# Patient Record
Sex: Female | Born: 1955 | Race: Black or African American | Hispanic: No | Smoking: Current every day smoker
Health system: Southern US, Community
[De-identification: ages and names within clinical notes are randomized; demographics above are authoritative.]

## PROBLEM LIST (undated history)

## (undated) DIAGNOSIS — F32A Depression, unspecified: Secondary | ICD-10-CM

## (undated) DIAGNOSIS — R42 Dizziness and giddiness: Secondary | ICD-10-CM

## (undated) DIAGNOSIS — F419 Anxiety disorder, unspecified: Secondary | ICD-10-CM

## (undated) DIAGNOSIS — Z972 Presence of dental prosthetic device (complete) (partial): Secondary | ICD-10-CM

## (undated) DIAGNOSIS — G4730 Sleep apnea, unspecified: Secondary | ICD-10-CM

## (undated) DIAGNOSIS — F329 Major depressive disorder, single episode, unspecified: Secondary | ICD-10-CM

## (undated) HISTORY — DX: Anxiety disorder, unspecified: F41.9

## (undated) HISTORY — DX: Depression, unspecified: F32.A

## (undated) HISTORY — DX: Sleep apnea, unspecified: G47.30

## (undated) HISTORY — PX: ABDOMINAL HYSTERECTOMY: SHX81

## (undated) HISTORY — DX: Major depressive disorder, single episode, unspecified: F32.9

## (undated) HISTORY — PX: TUBAL LIGATION: SHX77

## (undated) HISTORY — PX: TOTAL ABDOMINAL HYSTERECTOMY: SHX209

---

## 2005-02-17 ENCOUNTER — Emergency Department: Payer: Self-pay | Admitting: Emergency Medicine

## 2005-03-17 ENCOUNTER — Other Ambulatory Visit: Payer: Self-pay

## 2005-03-25 ENCOUNTER — Ambulatory Visit: Payer: Self-pay | Admitting: Obstetrics & Gynecology

## 2009-06-14 ENCOUNTER — Encounter: Payer: Self-pay | Admitting: General Practice

## 2009-06-25 ENCOUNTER — Encounter: Payer: Self-pay | Admitting: General Practice

## 2010-08-26 ENCOUNTER — Emergency Department: Payer: Self-pay | Admitting: Emergency Medicine

## 2015-11-01 ENCOUNTER — Ambulatory Visit: Payer: Self-pay | Admitting: Family Medicine

## 2015-12-28 ENCOUNTER — Ambulatory Visit: Payer: Self-pay | Admitting: Family Medicine

## 2018-11-15 ENCOUNTER — Ambulatory Visit: Payer: Self-pay | Admitting: Nurse Practitioner

## 2018-11-16 ENCOUNTER — Ambulatory Visit: Payer: Managed Care, Other (non HMO) | Admitting: Nurse Practitioner

## 2018-11-16 ENCOUNTER — Encounter: Payer: Self-pay | Admitting: Nurse Practitioner

## 2018-11-16 ENCOUNTER — Other Ambulatory Visit: Payer: Self-pay

## 2018-11-16 VITALS — BP 110/76 | HR 76 | Temp 98.3°F | Ht 63.9 in | Wt 190.0 lb

## 2018-11-16 DIAGNOSIS — E6609 Other obesity due to excess calories: Secondary | ICD-10-CM | POA: Diagnosis not present

## 2018-11-16 DIAGNOSIS — Z13220 Encounter for screening for lipoid disorders: Secondary | ICD-10-CM

## 2018-11-16 DIAGNOSIS — Z6832 Body mass index (BMI) 32.0-32.9, adult: Secondary | ICD-10-CM

## 2018-11-16 DIAGNOSIS — Z1329 Encounter for screening for other suspected endocrine disorder: Secondary | ICD-10-CM

## 2018-11-16 DIAGNOSIS — F331 Major depressive disorder, recurrent, moderate: Secondary | ICD-10-CM | POA: Diagnosis not present

## 2018-11-16 DIAGNOSIS — Z9189 Other specified personal risk factors, not elsewhere classified: Secondary | ICD-10-CM | POA: Diagnosis not present

## 2018-11-16 DIAGNOSIS — E669 Obesity, unspecified: Secondary | ICD-10-CM | POA: Insufficient documentation

## 2018-11-16 DIAGNOSIS — E66811 Obesity, class 1: Secondary | ICD-10-CM

## 2018-11-16 DIAGNOSIS — F17210 Nicotine dependence, cigarettes, uncomplicated: Secondary | ICD-10-CM

## 2018-11-16 DIAGNOSIS — Z1239 Encounter for other screening for malignant neoplasm of breast: Secondary | ICD-10-CM

## 2018-11-16 NOTE — Assessment & Plan Note (Signed)
Chronic, ongoing.  Continue to collaborate with [facility] and recommended patient continue to obtain treatment there, along with scripts to maintain continuity of care.

## 2018-11-16 NOTE — Progress Notes (Addendum)
New Patient Office Visit  Subjective:  Patient ID: Heather Hill, female    DOB: 19-Mar-1956  Age: 63 y.o. MRN: [id number]  CC:  Chief Complaint  Patient presents with  . Establish Care    pt states that she wants to check if everything is fine with her health    HPI Heather Hill presents for new patient visit to establish care.  Introduced to Designer, jewellery role and practice setting.  All questions answered.  She attends psychiatry visits at [facility] in [place]. Has not had primary care follow-up or screening in several years.  DEPRESSION Currently on Abilify, Ativan, and Prozac which is managed by [facility].  She sees psychiatry next May.  Was diagnosed when she worked at [facility] [employer], many years ago.  Has sister with Bipolar.  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 Has a history of her son passing in 2003.  Mood status: stable Satisfied with current treatment?: endorses concern with weight gain with Abilify Symptom severity: moderate  Duration of current treatment : chronic Side effects: no Medication compliance: good compliance Psychotherapy/counseling: yes current Depressed mood: yes Anxious mood: yes Anhedonia: no Significant weight loss or gain: yes Insomnia: yes hard to fall asleep Fatigue: yes Feelings of worthlessness or guilt: no Impaired concentration/indecisiveness: no Suicidal ideations: no Hopelessness: no Crying spells: yes Depression screen [facility] 2/9 11/16/2018  Decreased Interest 2  Down, Depressed, Hopeless 2  PHQ - 2 Score 4  Altered sleeping 3  Tired, decreased energy 3  Change in appetite 3  Feeling bad or failure about yourself  3  Trouble concentrating 2  Moving slowly or fidgety/restless 0  Suicidal thoughts 0  PHQ-9 Score 18   NICOTINE DEPENDENCE: Currently smokes 1/2 PPD and has smoked since age 43.  She is using patch and lozenges, which were provided  by [facility] and is attempting to quit at this time.    Past Medical History:  Diagnosis Date  . Anxiety   . Depression     Past Surgical History:  Procedure Laterality Date  . ABDOMINAL HYSTERECTOMY    . TUBAL LIGATION      Family History  Problem Relation Age of Onset  . Diabetes Mother   . Alzheimer's disease Mother   . Hypertension Father   . Asthma Father   . Diabetes Father   . Bipolar disorder Sister   . Diabetes Sister   . Diabetes Brother   . Diabetes Maternal Grandfather   . Alzheimer's disease Maternal Grandfather   . Heart disease Maternal Grandfather   . Asthma Paternal Grandfather   . Diabetes Paternal Grandfather   . Hypertension Paternal Grandfather     Social History   Socioeconomic History  . Marital status: Married    Spouse name: Not on file  . Number of children: Not on file  . Years of education: Not on file  . Highest education level: Not on file  Occupational History  . Occupation: retired  Scientific laboratory technician  . Financial resource strain: Not very hard  . Food insecurity:    Worry: Never true    Inability: Never true  . Transportation needs:    Medical: No    Non-medical: No  Tobacco Use  . Smoking status: Current Every Day Smoker    Packs/day: 0.50    Years: 48.00    Pack years: 24.00  . Smokeless tobacco: Never Used  . Tobacco comment: she currently  uses a patch  Substance and Sexual Activity  . Alcohol use: Yes    Alcohol/week: 2.0 standard drinks    Types: 2 Glasses of wine per week  . Drug use: Never  . Sexual activity: Not Currently  Lifestyle  . Physical activity:    Days per week: 0 days    Minutes per session: 0 min  . Stress: Not on file  Relationships  . Social connections:    Talks on phone: Three times a week    Gets together: Three times a week    Attends religious service: Never    Active member of club or organization: No    Attends meetings of clubs or organizations: Never    Relationship status: Married  .  Intimate partner violence:    Fear of current or ex partner: No    Emotionally abused: No    Physically abused: No    Forced sexual activity: No  Other Topics Concern  . Not on file  Social History Narrative  . Not on file    ROS Review of Systems  Constitutional: Negative for activity change, appetite change, diaphoresis, fatigue and fever.  Respiratory: Negative for cough, chest tightness and shortness of breath.   Cardiovascular: Negative for chest pain, palpitations and leg swelling.  Gastrointestinal: Negative for abdominal distention, abdominal pain, constipation, diarrhea, nausea and vomiting.  Endocrine: Negative for cold intolerance, heat intolerance, polydipsia, polyphagia and polyuria.  Neurological: Negative for dizziness, syncope, weakness, light-headedness, numbness and headaches.  Psychiatric/Behavioral: Negative.     Objective:   Today's Vitals: BP 110/76   Pulse 76   Temp 98.3 F (36.8 C) (Oral)   Ht 5' 3.9" (1.623 m)   Wt 190 lb (86.2 kg)   SpO2 96%   BMI 32.72 kg/m   Physical Exam Vitals signs and nursing note reviewed.  Constitutional:      General: She is awake.     Appearance: She is well-developed. She is obese.  HENT:     Head: Normocephalic.  Eyes:     General:        Right eye: No discharge.        Left eye: No discharge.     Conjunctiva/sclera: Conjunctivae normal.     Pupils: Pupils are equal, round, and reactive to light.  Neck:     Musculoskeletal: Normal range of motion and neck supple.     Thyroid: No thyromegaly.     Vascular: No carotid bruit or JVD.  Cardiovascular:     Rate and Rhythm: Normal rate and regular rhythm.     Heart sounds: Normal heart sounds. No murmur. No gallop.   Pulmonary:     Effort: Pulmonary effort is normal.     Breath sounds: Normal breath sounds.  Abdominal:     General: Bowel sounds are normal.     Palpations: Abdomen is soft.  Musculoskeletal:     Right lower leg: No edema.     Left lower leg:  No edema.  Lymphadenopathy:     Cervical: No cervical adenopathy.  Skin:    General: Skin is warm and dry.  Neurological:     Mental Status: She is alert and oriented to person, place, and time.  Psychiatric:        Attention and Perception: Attention normal.        Mood and Affect: Mood normal.        Speech: Speech normal.        Behavior: Behavior normal. Behavior  is cooperative.        Thought Content: Thought content normal.        Judgment: Judgment normal.     Comments: Moments of tearfulness as she talked about her mother.     Assessment & Plan:   Problem List Items Addressed This Visit      Other   Moderate episode of recurrent major depressive disorder ([place]) - Primary    Chronic, ongoing.  Continue to collaborate with [facility] and recommended patient continue to obtain treatment there, along with scripts to maintain continuity of care.        Relevant Medications   FLUoxetine (PROZAC) 20 MG capsule   LORazepam (ATIVAN) 1 MG tablet   Other Relevant Orders   CBC with Differential/Platelet (Completed)   TSH (Completed)   VITAMIN D 25 Hydroxy (Vit-D Deficiency, Fractures) (Completed)   At high risk for caregiver role strain    Caregiver to her mother who has dementia, has assistance from [facility] and two of her sisters.  Monitor closely and continue ongoing caregiver discussions.  Recommend caregiver support group locally.      Obesity    Recommend continued focus on health diet choices and regular physical activity (30 minutes 5 days a week).  Will focus on losing 1-2 pounds a week over the next two months with focus on walking daily and diet changes.      Nicotine dependence, cigarettes, uncomplicated    I have recommended complete cessation of tobacco use. I have discussed various options available for assistance with tobacco cessation including over the counter methods (Nicotine gum, patch and lozenges). We also discussed prescription options (Chantix,  Nicotine Inhaler / Nasal Spray). The patient is not interested in pursuing any prescription tobacco cessation options at this time.       Other Visit Diagnoses    Screening cholesterol level       Relevant Orders   Comprehensive metabolic panel (Completed)   Lipid Panel w/o Chol/HDL Ratio (Completed)   Thyroid disorder screen       Relevant Orders   TSH (Completed)   Breast cancer screening       Relevant Orders   MM DIGITAL SCREENING BILATERAL      Outpatient Encounter Medications as of 11/16/2018  Medication Sig  . ARIPiprazole (ABILIFY) 5 MG tablet   . FLUoxetine (PROZAC) 20 MG capsule Take by mouth.  Marland Kitchen LORazepam (ATIVAN) 1 MG tablet Take by mouth.   No facility-administered encounter medications on file as of 11/16/2018.     Follow-up: Return in about 2 months (around 01/16/2019) for Physical Exam .   Venita Lick, NP

## 2018-11-16 NOTE — Patient Instructions (Signed)
Alzheimer Disease Caregiver Guide  Alzheimer disease causes a person to lose the ability to remember things and make decisions. A person who has Alzheimer disease may not be able to take care of himself or herself. He or she may need help with simple tasks. The tips below can help you care for the person. What kind of changes does this condition cause? This condition makes a person:  Forget things.  Feel confused.  Act differently.  Have different moods. These things get worse with time. Tips to help with symptoms  Be calm and patient.  Respond with a simple, short answer.  Avoid correcting the person in a negative way.  Try not to take things personally, even if the person forgets your name.  Do not argue with the person. This may make the person more upset. Tips to lessen frustration  Make appointments and do daily tasks when the person is at his or her best.  Take your time. Simple tasks may take longer. Allow plenty of time to complete tasks.  Limit choices for the person.  Involve the person in what you are doing.  Keep a daily routine.  Avoid new or crowded places, if possible.  Use simple words, short sentences, and a calm voice. Only give one direction at a time.  Buy clothes and shoes that are easy to put on and take off.  Organize medicines in a pillbox for each day of the week.  Keep a calendar in a central location to remind the person of meetings or other activities.  Let people help if they offer. Take a break when needed. Tips to prevent injury  Keep floors clear. Remove rugs, magazine racks, and floor lamps.  Keep hallways well-lit.  Put a handrail and non-slip mat in the bathtub or shower.  Put childproof locks on cabinets that have dangerous items in them. These items include medicine, alcohol, guns, toxic cleaning items, sharp tools, matches, and lighters.  Put locks on doors where the person cannot see or reach them. This helps the person  to not wander out of the house and get lost.  Be prepared for emergencies. Keep a list of emergency phone numbers and addresses close by.  Bracelets may be worn that track location and identify the person as having memory problems. This should be worn at all times for safety. Tips for the future  Discuss financial and legal planning early. People with this disease have trouble managing their money as the disease gets worse. Get help from a professional.  Talk about advance directives, safety, and daily care. Take these steps: ? Create a living will and choose a power of attorney. This is someone who can make decisions for the person with Alzheimer disease when he or she can no longer do so. ? Discuss driving safety and when to stop driving. The person's doctor can help with this. ? If the person lives alone, make sure he or she is safe. Some people need extra help at home. Other people need more care at a nursing home or care center. Where to find support You can find support by joining a support group near you. Some benefits of joining a support group include:  Learning ways to manage stress.  Sharing experiences with others.  Getting emotional comfort and support.  Learning about caregiving as the disease progresses.  Knowing what community resources are available and making use of them. Where to find more information  Alzheimer's Association: [employer] Contact a doctor if:  The person has a fever.  The person has a sudden behavior change that does not get better with calming strategies.  The person is not able to take care of himself or herself at home.  The person threatens you or anyone else, including himself or herself.  You are no longer able to care for the person. Summary  Alzheimer disease causes a person to forget things and to be confused.  A person who has this condition may not be able to take care of himself or herself.  Take steps to keep the person  from getting hurt. Plan for future care.  You can find support by joining a support group near you. This information is not intended to replace advice given to you by your health care provider. Make sure you discuss any questions you have with your health care provider. Document Released: 11/03/2011 Document Revised: 08/06/2017 Document Reviewed: 08/06/2017 Elsevier Interactive Patient Education  2019 [place] and Cholesterol Restricted Eating Plan Getting too much fat and cholesterol in your diet may cause health problems. Choosing the right foods helps keep your fat and cholesterol at normal levels. This can keep you from getting certain diseases. Your doctor may recommend an eating plan that includes:  Total fat: ______% or less of total calories a day.  Saturated fat: ______% or less of total calories a day.  Cholesterol: less than _________mg a day.  Fiber: ______g a day. What are tips for following this plan? Meal planning  At meals, divide your plate into four equal parts: ? Fill one-half of your plate with vegetables and green salads. ? Fill one-fourth of your plate with whole grains. ? Fill one-fourth of your plate with low-fat (lean) protein foods.  Eat fish that is high in omega-3 fats at least two times a week. This includes mackerel, tuna, sardines, and salmon.  Eat foods that are high in fiber, such as whole grains, beans, apples, broccoli, carrots, peas, and barley. General tips   Work with your doctor to lose weight if you need to.  Avoid: ? Foods with added sugar. ? Fried foods. ? Foods with partially hydrogenated oils.  Limit alcohol intake to no more than 1 drink a day for nonpregnant women and 2 drinks a day for men. One drink equals 12 oz of beer, 5 oz of wine, or 1 oz of hard liquor. Reading food labels  Check food labels for: ? Trans fats. ? Partially hydrogenated oils. ? Saturated fat (g) in each serving. ? Cholesterol (mg) in each  serving. ? Fiber (g) in each serving.  Choose foods with healthy fats, such as: ? Monounsaturated fats. ? Polyunsaturated fats. ? Omega-3 fats.  Choose grain products that have whole grains. Look for the word "whole" as the first word in the ingredient list. Cooking  Cook foods using low-fat methods. These include baking, boiling, grilling, and broiling.  Eat more home-cooked foods. Eat at restaurants and buffets less often.  Avoid cooking using saturated fats, such as butter, cream, palm oil, palm kernel oil, and coconut oil. Recommended foods  Fruits  All fresh, canned (in natural juice), or frozen fruits. Vegetables  Fresh or frozen vegetables (raw, steamed, roasted, or grilled). Green salads. Grains  Whole grains, such as whole wheat or whole grain breads, crackers, cereals, and pasta. Unsweetened oatmeal, bulgur, barley, quinoa, or brown rice. Corn or whole wheat flour tortillas. Meats and other protein foods  Ground beef (85% or leaner), grass-fed beef, or beef trimmed of fat.  Skinless chicken or [place]. Ground chicken or [place]. Pork trimmed of fat. All fish and seafood. Egg whites. Dried beans, peas, or lentils. Unsalted nuts or seeds. Unsalted canned beans. Nut butters without added sugar or oil. Dairy  Low-fat or nonfat dairy products, such as skim or 1% milk, 2% or reduced-fat cheeses, low-fat and fat-free ricotta or cottage cheese, or plain low-fat and nonfat yogurt. Fats and oils  Tub margarine without trans fats. Light or reduced-fat mayonnaise and salad dressings. Avocado. Olive, canola, sesame, or safflower oils. The items listed above may not be a complete list of foods and beverages you can eat. Contact a dietitian for more information. Foods to avoid Fruits  Canned fruit in heavy syrup. Fruit in cream or butter sauce. Fried fruit. Vegetables  Vegetables cooked in cheese, cream, or butter sauce. Fried vegetables. Grains  White bread. White pasta. White  rice. Cornbread. Bagels, pastries, and croissants. Crackers and snack foods that contain trans fat and hydrogenated oils. Meats and other protein foods  Fatty cuts of meat. Ribs, chicken wings, bacon, sausage, bologna, salami, chitterlings, fatback, hot dogs, bratwurst, and packaged lunch meats. Liver and organ meats. Whole eggs and egg yolks. Chicken and [place] with skin. Fried meat. Dairy  Whole or 2% milk, cream, half-and-half, and cream cheese. Whole milk cheeses. Whole-fat or sweetened yogurt. Full-fat cheeses. Nondairy creamers and whipped toppings. Processed cheese, cheese spreads, and cheese curds. Beverages  Alcohol. Sugar-sweetened drinks such as sodas, lemonade, and fruit drinks. Fats and oils  Butter, stick margarine, lard, shortening, ghee, or bacon fat. Coconut, palm kernel, and palm oils. Sweets and desserts  Corn syrup, sugars, honey, and molasses. Candy. Jam and jelly. Syrup. Sweetened cereals. Cookies, pies, cakes, donuts, muffins, and ice cream. The items listed above may not be a complete list of foods and beverages you should avoid. Contact a dietitian for more information. Summary  Choosing the right foods helps keep your fat and cholesterol at normal levels. This can keep you from getting certain diseases.  At meals, fill one-half of your plate with vegetables and green salads.  Eat high-fiber foods, like whole grains, beans, apples, carrots, peas, and barley.  Limit added sugar, saturated fats, alcohol, and fried foods. This information is not intended to replace advice given to you by your health care provider. Make sure you discuss any questions you have with your health care provider. Document Released: 02/10/2012 Document Revised: 04/14/2018 Document Reviewed: 04/28/2017 Elsevier Interactive Patient Education  2019 [employer].

## 2018-11-16 NOTE — Assessment & Plan Note (Addendum)
Caregiver to her mother who has dementia, has assistance from [facility] and two of her sisters.  Monitor closely and continue ongoing caregiver discussions.  Recommend caregiver support group locally.

## 2018-11-16 NOTE — Assessment & Plan Note (Signed)
Recommend continued focus on health diet choices and regular physical activity (30 minutes 5 days a week).  Will focus on losing 1-2 pounds a week over the next two months with focus on walking daily and diet changes.

## 2018-11-17 ENCOUNTER — Other Ambulatory Visit: Payer: Self-pay | Admitting: Nurse Practitioner

## 2018-11-17 DIAGNOSIS — F17210 Nicotine dependence, cigarettes, uncomplicated: Secondary | ICD-10-CM | POA: Insufficient documentation

## 2018-11-17 DIAGNOSIS — E7800 Pure hypercholesterolemia, unspecified: Secondary | ICD-10-CM

## 2018-11-17 DIAGNOSIS — E785 Hyperlipidemia, unspecified: Secondary | ICD-10-CM | POA: Insufficient documentation

## 2018-11-17 DIAGNOSIS — E559 Vitamin D deficiency, unspecified: Secondary | ICD-10-CM

## 2018-11-17 LAB — VITAMIN D 25 HYDROXY (VIT D DEFICIENCY, FRACTURES): Vit D, 25-Hydroxy: 10.3 ng/mL — ABNORMAL LOW (ref 30.0–100.0)

## 2018-11-17 LAB — LIPID PANEL W/O CHOL/HDL RATIO
Cholesterol, Total: 181 mg/dL (ref 100–199)
HDL: 47 mg/dL (ref >39)
LDL Calculated: 117 mg/dL — ABNORMAL HIGH (ref 0–99)
Triglycerides: 85 mg/dL (ref 0–149)
VLDL Cholesterol Cal: 17 mg/dL (ref 5–40)

## 2018-11-17 LAB — COMPREHENSIVE METABOLIC PANEL
A/G RATIO: 1.5 (ref 1.2–2.2)
ALT: 12 IU/L (ref 0–32)
AST: 13 IU/L (ref 0–40)
Albumin: 4.4 g/dL (ref 3.8–4.8)
Alkaline Phosphatase: 103 IU/L (ref 39–117)
BUN/Creatinine Ratio: 12 (ref 12–28)
BUN: 11 mg/dL (ref 8–27)
Bilirubin Total: 0.4 mg/dL (ref 0.0–1.2)
CALCIUM: 10 mg/dL (ref 8.7–10.3)
CO2: 25 mmol/L (ref 20–29)
Chloride: 101 mmol/L (ref 96–106)
Creatinine, Ser: 0.89 mg/dL (ref 0.57–1.00)
GFR calc Af Amer: 80 mL/min/{1.73_m2} (ref >59)
GFR, EST NON AFRICAN AMERICAN: 70 mL/min/{1.73_m2} (ref >59)
GLUCOSE: 96 mg/dL (ref 65–99)
Globulin, Total: 3 g/dL (ref 1.5–4.5)
Potassium: 4.7 mmol/L (ref 3.5–5.2)
Sodium: 141 mmol/L (ref 134–144)
TOTAL PROTEIN: 7.4 g/dL (ref 6.0–8.5)

## 2018-11-17 LAB — CBC WITH DIFFERENTIAL/PLATELET
Basophils Absolute: 0.1 10*3/uL (ref 0.0–0.2)
Basos: 1 %
EOS (ABSOLUTE): 0.1 10*3/uL (ref 0.0–0.4)
Eos: 2 %
Hematocrit: 43.2 % (ref 34.0–46.6)
Hemoglobin: 14.3 g/dL (ref 11.1–15.9)
Immature Grans (Abs): 0 10*3/uL (ref 0.0–0.1)
Immature Granulocytes: 0 %
Lymphocytes Absolute: 2 10*3/uL (ref 0.7–3.1)
Lymphs: 32 %
MCH: 26.7 pg (ref 26.6–33.0)
MCHC: 33.1 g/dL (ref 31.5–35.7)
MCV: 81 fL (ref 79–97)
MONOCYTES: 10 %
Monocytes Absolute: 0.6 10*3/uL (ref 0.1–0.9)
NEUTROS PCT: 55 %
Neutrophils Absolute: 3.5 10*3/uL (ref 1.4–7.0)
Platelets: 335 10*3/uL (ref 150–450)
RBC: 5.35 x10E6/uL — AB (ref 3.77–5.28)
RDW: 14.5 % (ref 11.7–15.4)
WBC: 6.3 10*3/uL (ref 3.4–10.8)

## 2018-11-17 LAB — TSH: TSH: 1.1 u[IU]/mL (ref 0.450–4.500)

## 2018-11-17 MED ORDER — CHOLECALCIFEROL 1.25 MG (50000 UT) PO CAPS: 50000.0000 [IU] | ORAL_CAPSULE | ORAL | 0 refills | Status: DC

## 2018-11-17 NOTE — Assessment & Plan Note (Signed)
I have recommended complete cessation of tobacco use. I have discussed various options available for assistance with tobacco cessation including over the counter methods (Nicotine gum, patch and lozenges). We also discussed prescription options (Chantix, Nicotine Inhaler / Nasal Spray). The patient is not interested in pursuing any prescription tobacco cessation options at this time.  

## 2018-11-17 NOTE — Progress Notes (Signed)
Vitamin D sent into pharmacy and need lab recheck in 8 weeks.

## 2018-11-18 ENCOUNTER — Telehealth: Payer: Self-pay | Admitting: Nurse Practitioner

## 2018-11-18 NOTE — Telephone Encounter (Signed)
Pt called and left voicemail in [place] to get lab results on 11/17/2018.  States she can be reached at [phone].  Copied from [place] [phone]. Topic: Quick Communication - Lab Results (Clinic Use ONLY) >> Nov 17, 2018  3:16 PM Amada Kingfisher, CMA wrote: Called patient to inform them of recent lab results. When patient returns call, triage nurse may disclose results.

## 2019-01-19 ENCOUNTER — Other Ambulatory Visit (HOSPITAL_COMMUNITY)
Admission: RE | Admit: 2019-01-19 | Discharge: 2019-01-19 | Disposition: A | Discharge status: Home / Self Care | Payer: Managed Care, Other (non HMO) | Source: Ambulatory Visit | Attending: Nurse Practitioner | Admitting: Nurse Practitioner

## 2019-01-19 ENCOUNTER — Encounter: Payer: Self-pay | Admitting: Nurse Practitioner

## 2019-01-19 ENCOUNTER — Other Ambulatory Visit: Payer: Self-pay

## 2019-01-19 ENCOUNTER — Ambulatory Visit (INDEPENDENT_AMBULATORY_CARE_PROVIDER_SITE_OTHER): Payer: Managed Care, Other (non HMO) | Admitting: Nurse Practitioner

## 2019-01-19 VITALS — BP 115/76 | HR 92 | Temp 98.4°F | Ht 64.57 in | Wt 193.0 lb

## 2019-01-19 DIAGNOSIS — F331 Major depressive disorder, recurrent, moderate: Secondary | ICD-10-CM | POA: Diagnosis not present

## 2019-01-19 DIAGNOSIS — Z23 Encounter for immunization: Secondary | ICD-10-CM

## 2019-01-19 DIAGNOSIS — Z1211 Encounter for screening for malignant neoplasm of colon: Secondary | ICD-10-CM

## 2019-01-19 DIAGNOSIS — Z6832 Body mass index (BMI) 32.0-32.9, adult: Secondary | ICD-10-CM

## 2019-01-19 DIAGNOSIS — Z0000 Encounter for general adult medical examination without abnormal findings: Secondary | ICD-10-CM | POA: Diagnosis not present

## 2019-01-19 DIAGNOSIS — Z114 Encounter for screening for human immunodeficiency virus [HIV]: Secondary | ICD-10-CM

## 2019-01-19 DIAGNOSIS — E7800 Pure hypercholesterolemia, unspecified: Secondary | ICD-10-CM | POA: Diagnosis not present

## 2019-01-19 DIAGNOSIS — Z1159 Encounter for screening for other viral diseases: Secondary | ICD-10-CM

## 2019-01-19 DIAGNOSIS — F17210 Nicotine dependence, cigarettes, uncomplicated: Secondary | ICD-10-CM | POA: Diagnosis not present

## 2019-01-19 DIAGNOSIS — E6609 Other obesity due to excess calories: Secondary | ICD-10-CM

## 2019-01-19 DIAGNOSIS — B3731 Acute candidiasis of vulva and vagina: Secondary | ICD-10-CM

## 2019-01-19 DIAGNOSIS — B373 Candidiasis of vulva and vagina: Secondary | ICD-10-CM

## 2019-01-19 LAB — WET PREP FOR TRICH, YEAST, CLUE
Clue Cell Exam: NEGATIVE
Trichomonas Exam: NEGATIVE
Yeast Exam: POSITIVE — AB

## 2019-01-19 MED ORDER — FLUCONAZOLE 150 MG PO TABS: 150.0000 mg | ORAL_TABLET | Freq: Once | ORAL | 0 refills | Status: AC

## 2019-01-19 NOTE — Assessment & Plan Note (Addendum)
Repeat lipid panel today, she reports she is fasting.  ASCVD 7.8% on last labs.  Add statin as needed and discuss with patient.  She has been focused on diet.

## 2019-01-19 NOTE — Assessment & Plan Note (Signed)
Recommend continued focus on health diet choices and regular physical activity (30 minutes 5 days a week). Focus on small losses at at time vs attempting to lose large amounts of weight at once.

## 2019-01-19 NOTE — Patient Instructions (Addendum)
[facility] at [facility]  Address: [address] [place], [place], [place] [address]  Phone: [phone]  May take Vitamin D3 1000 units daily,  Obtain at local [employer] or [employer]   Living With Post-Traumatic Stress Disorder If you have been diagnosed with post-traumatic stress disorder (PTSD), you may be relieved that you now know why you have felt or behaved a certain way. Still, you may feel overwhelmed about the treatment ahead. You may also wonder how to get the support you need and how to deal with the condition day-to-day. If you are living with PTSD, there are ways to help you recover from it and manage your symptoms. How to manage lifestyle changes Managing stress Stress is your body's reaction to life changes and events, both good and bad. Stress can make PTSD worse. Take the following steps to cope with stress:  Talk with your health care provider or a counselor if you would like to learn more about techniques to reduce your stress. He or she may suggest some stress reduction techniques such as: ? Muscle relaxation exercises. ? Regular exercise. ? Meditation, yoga, or other mind-body exercises. ? Breathing exercises. ? Listening to quiet music. ? Spending time outside.  Maintain a healthy lifestyle. Eat a healthy diet, exercise regularly, get plenty of sleep, and take time to relax.  Spend time with others. Talk with them about how you are feeling and what kind of support you need. Try to not isolate yourself, even though you may feel like doing that. Isolating yourself can delay your recovery.  Do activities and hobbies that you enjoy.  Pace yourself when doing stressful things. Take breaks, and reward yourself when you finish. Make sure that you do not overload your schedule.  Medicines Your health care provider may suggest certain medicines if he or she feels that they will help to improve your condition. Antidepressants or antipsychotic medicines may  be used to treat PTSD. Avoid using alcohol and other substances that may prevent your medicines from working properly (may interact). It is also important to:  Talk with your pharmacist or health care provider about all medicines that you take, their possible side effects, and which medicines are safe to take together.  Make it your goal to take part in all treatment decisions (shared decision-making). Ask about possible side effects of medicines that your health care provider recommends, and tell him or her how you feel about having those side effects. It is best if shared decision-making with your health care provider is part of your total treatment plan. If your health care provider prescribes a medicine, you may not notice the full benefits of it for 4-8 weeks. Most people who are treated for PTSD need to take medicine for at least 6-12 months after they feel better. If you are taking medicines as part of your treatment, do not stop taking medicines before you ask your health care provider if it is safe to stop. You may need to have the medicine slowly decreased (tapered) over time to lower the risk of harmful side effects. Relationships Many people who have PTSD have difficulty trusting others. Make an effort to:  Take risks and develop trust with close friends and family members. Developing trust in others can help you feel safe and connect you with emotional support.  Be open and honest about your feelings.  Try to have fun and relax in safe spaces, such as with friends and family.  Think about going to couples  counseling, family education classes, or family therapy. Your loved ones may not always know how to be supportive. Therapy can be helpful for everyone. How to recognize changes in your condition Be aware of your symptoms and how often you have them. The following symptoms mean that you need to seek help for your PTSD:  You feel suspicious and angry.  You have repeated flashbacks.   You avoid going out or being with others.  You have an increasing number of fights with close friends or family members, such as your spouse.  You have thoughts about hurting yourself or others.  You cannot get relief from feelings of depression or anxiety. Where to find support Talking to others  Explain that PTSD is a mental health problem. It is something that a person can develop after experiencing or seeing a life-threatening event. Tell them that PTSD makes you feel stress like you did during the event.  Talk to your loved ones about the symptoms you have. Also tell them what things or situations can cause symptoms to start (are triggers for you).  Assure your loved ones that there are treatments to help PTSD. Discuss possibly seeking family therapy or couples therapy.  If you are worried or fearful about seeking treatment, ask for support. Finances Not all insurance plans cover mental health care, so it is important to check with your insurance carrier. If paying for co-pays or counseling services is a problem, search for a local or county mental health care center. Public mental health care services may be offered there at a low cost or no cost when you are not able to see a private health care provider. If you are a veteran, contact a local veterans organization or veterans hospital for more information. If you are taking medicine for PTSD, you may be able to get the genericform, which may be less expensive than brand-name medicine. Some makers of prescription medicines also offer help to patients who cannot afford the medicines that they need. Community resources  Find a support group in your community. Often, groups are available for [employer] veterans, trauma victims, and family members or caregivers.  Look into volunteer opportunities. Taking part in these can help you feel more connected to your community.  Contact a local organization to find out if you are eligible for a  service dog.  Keep daily contact with at least one trusted friend or family member. Follow these instructions at home: Lifestyle  Exercise regularly. Try to do 30 or more minutes of physical activity on most days of the week.  Try to get 7-9 hours of sleep each night. To help with sleep: ? Keep your bedroom cool and dark. ? Do not eat a heavy meal during the hour before you go to bed. ? Do not drink alcohol or caffeinated drinks before bed. ? Avoid screen time before bedtime. This means avoiding use of your TV, computer, tablet, and cell phone.  Avoid using alcohol or drugs.  Practice self-soothing skills and use them daily.  Try to have fun and seek humor in your life. General instructions  If your PTSD is affecting your marriage or family, seek help from a family therapist.  Take over-the-counter and prescription medicines only as told by your health care provider.  Make sure to let all of your health care providers know that you have PTSD. This is especially important if you are having surgery or need to be admitted to the hospital.  Keep all follow-up visits as  told by your health care providers. This is important. Where to find more information Go to this website to find more information about PTSD, treatment for PTSD, and how to get support:  [facility] for PTSD: www.ptsd.PaintballBuzz.cz Contact a health care provider if:  Your symptoms get worse or they do not get better. Get help right away if:  You have thoughts about hurting yourself or others. If you ever feel like you may hurt yourself or others, or have thoughts about taking your own life, get help right away. You can go to your nearest emergency department or call:  Your local emergency services (911 in the [place]  A suicide crisis helpline, such as the [facility] at 2818231914. This is open 24-hours a day. Summary  If you are living with PTSD, there are ways to help you recover  from it and manage your symptoms.  Find supportive environments and people who understand PTSD. Spend time in those places, and maintain contact with those people.  Work with your health care team to create a management plan that includes counseling, stress management techniques, and healthy lifestyle habits. This information is not intended to replace advice given to you by your health care provider. Make sure you discuss any questions you have with your health care provider. Document Released: 12/11/2016 Document Revised: 12/11/2016 Document Reviewed: 12/11/2016 Elsevier Interactive Patient Education  2019 [employer]. Tdap Vaccine (Tetanus, Diphtheria and Pertussis): What You Need to Know 1. Why get vaccinated? Tetanus, diphtheria and pertussis are very serious diseases. Tdap vaccine can protect [place] from these diseases. And, Tdap vaccine given to pregnant women can protect newborn babies against pertussis.Marland Kitchen TETANUS (Lockjaw) is rare in the [place] today. It causes painful muscle tightening and stiffness, usually all over the body.  It can lead to tightening of muscles in the head and neck so you can't open your mouth, swallow, or sometimes even breathe. Tetanus kills about 1 out of 10 people who are infected even after receiving the best medical care. DIPHTHERIA is also rare in the [place] today. It can cause a thick coating to form in the back of the throat.  It can lead to breathing problems, heart failure, paralysis, and death. PERTUSSIS (Whooping Cough) causes severe coughing spells, which can cause difficulty breathing, vomiting and disturbed sleep.  It can also lead to weight loss, incontinence, and rib fractures. Up to 2 in 100 adolescents and 5 in 100 adults with pertussis are hospitalized or have complications, which could include pneumonia or death. These diseases are caused by bacteria. Diphtheria and pertussis are spread from person to person through secretions from  coughing or sneezing. Tetanus enters the body through cuts, scratches, or wounds. Before vaccines, as many as 200,000 cases of diphtheria, 200,000 cases of pertussis, and hundreds of cases of tetanus, were reported in the [place] each year. Since vaccination began, reports of cases for tetanus and diphtheria have dropped by about 99% and for pertussis by about 80%. 2. Tdap vaccine Tdap vaccine can protect adolescents and adults from tetanus, diphtheria, and pertussis. One dose of Tdap is routinely given at age 18 or 37. People who did not get Tdap at that age should get it as soon as possible. Tdap is especially important for healthcare professionals and anyone having close contact with a baby younger than 12 months. Pregnant women should get a dose of Tdap during every pregnancy, to protect the newborn from pertussis. Infants are most at risk for severe,  life-threatening complications from pertussis. Another vaccine, called Td, protects against tetanus and diphtheria, but not pertussis. A Td booster should be given every 10 years. Tdap may be given as one of these boosters if you have never gotten Tdap before. Tdap may also be given after a severe cut or burn to prevent tetanus infection. Your doctor or the person giving you the vaccine can give you more information. Tdap may safely be given at the same time as other vaccines. 3. Some people should not get this vaccine  A person who has ever had a life-threatening allergic reaction after a previous dose of any diphtheria, tetanus or pertussis containing vaccine, OR has a severe allergy to any part of this vaccine, should not get Tdap vaccine. Tell the person giving the vaccine about any severe allergies.  Anyone who had coma or long repeated seizures within 7 days after a childhood dose of DTP or DTaP, or a previous dose of Tdap, should not get Tdap, unless a cause other than the vaccine was found. They can still get Td.  Talk to your doctor if  you: ? have seizures or another nervous system problem, ? had severe pain or swelling after any vaccine containing diphtheria, tetanus or pertussis, ? ever had a condition called Guillain-Barr Syndrome (GBS), ? aren't feeling well on the day the shot is scheduled. 4. Risks With any medicine, including vaccines, there is a chance of side effects. These are usually mild and go away on their own. Serious reactions are also possible but are rare. Most people who get Tdap vaccine do not have any problems with it. Mild problems following Tdap (Did not interfere with activities)  Pain where the shot was given (about 3 in 4 adolescents or 2 in 3 adults)  Redness or swelling where the shot was given (about 1 person in 5)  Mild fever of at least 100.76F (up to about 1 in 25 adolescents or 1 in 100 adults)  Headache (about 3 or 4 people in 10)  Tiredness (about 1 person in 3 or 4)  Nausea, vomiting, diarrhea, stomach ache (up to 1 in 4 adolescents or 1 in 10 adults)  Chills, sore joints (about 1 person in 10)  Body aches (about 1 person in 3 or 4)  Rash, swollen glands (uncommon) Moderate problems following Tdap (Interfered with activities, but did not require medical attention)  Pain where the shot was given (up to 1 in 5 or 6)  Redness or swelling where the shot was given (up to about 1 in 16 adolescents or 1 in 12 adults)  Fever over 102F (about 1 in 100 adolescents or 1 in 250 adults)  Headache (about 1 in 7 adolescents or 1 in 10 adults)  Nausea, vomiting, diarrhea, stomach ache (up to 1 or 3 people in 100)  Swelling of the entire arm where the shot was given (up to about 1 in 500). Severe problems following Tdap (Unable to perform usual activities; required medical attention)  Swelling, severe pain, bleeding and redness in the arm where the shot was given (rare). Problems that could happen after any vaccine:  People sometimes faint after a medical procedure, including  vaccination. Sitting or lying down for about 15 minutes can help prevent fainting, and injuries caused by a fall. Tell your doctor if you feel dizzy, or have vision changes or ringing in the ears.  Some people get severe pain in the shoulder and have difficulty moving the arm where a shot was  given. This happens very rarely.  Any medication can cause a severe allergic reaction. Such reactions from a vaccine are very rare, estimated at fewer than 1 in a million doses, and would happen within a few minutes to a few hours after the vaccination. As with any medicine, there is a very remote chance of a vaccine causing a serious injury or death. The safety of vaccines is always being monitored. For more information, visit: http://www.aguilar.org/ 5. What if there is a serious problem? What should I look for?  Look for anything that concerns you, such as signs of a severe allergic reaction, very high fever, or unusual behavior. Signs of a severe allergic reaction can include hives, swelling of the face and throat, difficulty breathing, a fast heartbeat, dizziness, and weakness. These would usually start a few minutes to a few hours after the vaccination. What should I do?  If you think it is a severe allergic reaction or other emergency that can't wait, call 9-1-1 or get the person to the nearest hospital. Otherwise, call your doctor.  Afterward, the reaction should be reported to the Vaccine Adverse Event Reporting System (VAERS). Your doctor might file this report, or you can do it yourself through the VAERS web site at www.vaers.SamedayNews.es, or by calling 939-224-5487. VAERS does not give medical advice. 6. The National Vaccine Injury Compensation Program The [employer] Vaccine Injury Compensation Program (VICP) is a federal program that was created to compensate people who may have been injured by certain vaccines. Persons who believe they may have been injured by a vaccine can learn about the  program and about filing a claim by calling (225)057-2251 or visiting the [place] website at GoldCloset.com.ee. There is a time limit to file a claim for compensation. 7. How can I learn more?  Ask your doctor. He or she can give you the vaccine package insert or suggest other sources of information.  Call your local or state health department.  Contact the Centers for Disease Control and Prevention (CDC): ? Call 573-505-9103 (1-800-CDC-INFO) or ? Visit CDC's website at http://hunter.com/ Vaccine Information Statement Tdap Vaccine (10/18/2013) This information is not intended to replace advice given to you by your health care provider. Make sure you discuss any questions you have with your health care provider. Document Released: 02/10/2012 Document Revised: 03/29/2018 Document Reviewed: 03/29/2018 Elsevier Interactive Patient Education  2019 [employer].

## 2019-01-19 NOTE — Assessment & Plan Note (Signed)
I have recommended complete cessation of tobacco use. I have discussed various options available for assistance with tobacco cessation including over the counter methods (Nicotine gum, patch and lozenges). We also discussed prescription options (Chantix, Nicotine Inhaler / Nasal Spray). The patient is not interested in pursuing any prescription tobacco cessation options at this time.  

## 2019-01-19 NOTE — Progress Notes (Signed)
.   BP 115/76    Pulse 92    Temp 98.4 F (36.9 C) (Oral)    Ht 5' 4.57" (1.64 m)    Wt 193 lb (87.5 kg)    SpO2 98%    BMI 32.55 kg/m    Subjective:    Patient ID: Heather Hill, female    DOB: 01-27-1956, 63 y.o.   MRN: [id number]  HPI: Heather Hill is a 63 y.o. female presenting on 01/19/2019 for comprehensive medical examination. Current medical complaints include:none  She currently lives with: with her husband Menopausal Symptoms: no   DEPRESSION Currently on Abilify, Ativan, and Prozac which is managed by [facility].  Recently visited with them and she sees psychiatry next August 26th.  Has sister with Bipolar.  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 Has a history of her son passing in 2003.  She endorses driving as a trigger for her, especially if people drive too close or has to speed up.  Her son was killed in a car accident, discussed with her that this may be reason driving is trigger for her due to past traumatic experience with loss of son.  She does not currently do talk therapy and discussed this element may be beneficial for her due to her history, as it could provide ways to cope and techniques to help with anxieties while driving.  She is interested in therapy.  Therapy is available at [facility].   Mood status: stable Satisfied with current treatment?: no Symptom severity: mild  Duration of current treatment : chronic Side effects: no Medication compliance: good compliance Psychotherapy/counseling: not current Previous psychiatric medications: multiple she does not recall Depressed mood: yes Anxious mood: yes Anhedonia: no Significant weight loss or gain: no Insomnia: yes hard to fall asleep , reports psychiatrist is going to put her on another medication for this Fatigue: no Feelings of worthlessness or guilt: no Impaired concentration/indecisiveness: no Suicidal ideations:  no Hopelessness: yes Crying spells: no  Depression Screen done today and results listed below:  Depression screen [facility] 2/9 01/19/2019 11/16/2018  Decreased Interest 3 2  Down, Depressed, Hopeless 3 2  PHQ - 2 Score 6 4  Altered sleeping 3 3  Tired, decreased energy 3 3  Change in appetite 3 3  Feeling bad or failure about yourself  2 3  Trouble concentrating 2 2  Moving slowly or fidgety/restless 0 0  Suicidal thoughts 0 0  PHQ-9 Score 19 18  Difficult doing work/chores Somewhat difficult -    The patient does not have a history of falls. I did not complete a risk assessment for falls. A plan of care for falls was not documented.   Past Medical History:  Past Medical History:  Diagnosis Date   Anxiety    Depression     Surgical History:  Past Surgical History:  Procedure Laterality Date   ABDOMINAL HYSTERECTOMY     TUBAL LIGATION      Medications:  Current Outpatient Medications on File Prior to Visit  Medication Sig   ARIPiprazole (ABILIFY) 5 MG tablet    Cholecalciferol 1.25 MG (50000 UT) capsule Take 1 capsule (50,000 Units total) by mouth once a week.   FLUoxetine (PROZAC) 20 MG capsule Take by mouth.   LORazepam (ATIVAN) 1 MG tablet Take by mouth.   No current facility-administered medications on file prior to visit.     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 hard   Food insecurity:    Worry: Never true    Inability: Never true   Transportation needs:    Medical: No    Non-medical: No  Tobacco Use   Smoking status: Current Every Day Smoker    Packs/day: 0.50    Years: 48.00    Pack years: 24.00   Smokeless tobacco: Never  Used   Tobacco comment: she currently uses a patch  Substance and Sexual Activity   Alcohol use: Yes    Alcohol/week: 2.0 standard drinks    Types: 2 Glasses of wine per week   Drug use: Never   Sexual activity: Not Currently  Lifestyle   Physical activity:    Days per week: 0 days    Minutes per session: 0 min   Stress: Not on file  Relationships   Social connections:    Talks on phone: Three times a week    Gets together: Three times a week    Attends religious service: Never    Active member of club or organization: No    Attends meetings of clubs or organizations: Never    Relationship status: Married   Intimate partner violence:    Fear of current or ex partner: No    Emotionally abused: No    Physically abused: No    Forced sexual activity: No  Other Topics Concern   Not on file  Social History Narrative   Not on file   Social History   Tobacco Use  Smoking Status Current Every Day Smoker   Packs/day: 0.50   Years: 48.00   Pack years: 24.00  Smokeless Tobacco Never Used  Tobacco Comment   she currently uses a patch   Social History   Substance and Sexual Activity  Alcohol Use Yes   Alcohol/week: 2.0 standard drinks   Types: 2 Glasses of wine per week    Family History:  Family History  Problem Relation Age of Onset   Diabetes Mother    Alzheimer's disease Mother    Hypertension Father    Asthma Father    Diabetes Father    Bipolar disorder Sister    Diabetes Sister    Diabetes Brother    Diabetes Maternal Grandfather    Alzheimer's disease Maternal Grandfather    Heart disease Maternal Grandfather    Asthma Paternal Grandfather    Diabetes Paternal Grandfather    Hypertension Paternal Grandfather     Past medical history, surgical history, medications, allergies, family history and social history reviewed with patient today and changes made to appropriate areas of the chart.   Review of Systems - Negative All  other ROS negative except what is listed above and in the HPI.      Objective:    BP 115/76    Pulse 92    Temp 98.4 F (36.9 C) (Oral)    Ht 5' 4.57" (1.64 m)    Wt 193 lb (87.5 kg)    SpO2 98%    BMI 32.55 kg/m   Wt Readings from Last 3 Encounters:  01/19/19 193 lb (87.5 kg)  11/16/18 190 lb (86.2 kg)  Physical Exam Vitals signs and nursing note reviewed.  Constitutional:      General: She is awake. She is not in acute distress.    Appearance: She is well-developed and overweight. She is not ill-appearing.  HENT:     Head: Normocephalic.     Right Ear: Hearing, tympanic membrane, ear canal and external ear normal.     Left Ear: Hearing, tympanic membrane, ear canal and external ear normal.     Nose: Nose normal.     Mouth/Throat:     Mouth: Mucous membranes are moist.  Eyes:     General: Lids are normal.        Right eye: No discharge.        Left eye: No discharge.     Extraocular Movements: Extraocular movements intact.     Conjunctiva/sclera: Conjunctivae normal.     Pupils: Pupils are equal, round, and reactive to light.     Visual Fields: Right eye visual fields normal and left eye visual fields normal.  Neck:     Musculoskeletal: Normal range of motion and neck supple.     Thyroid: No thyromegaly.     Vascular: No carotid bruit.  Cardiovascular:     Rate and Rhythm: Normal rate and regular rhythm.     Heart sounds: Normal heart sounds. No murmur. No gallop.   Pulmonary:     Effort: Pulmonary effort is normal.     Breath sounds: Normal breath sounds.  Chest:     Breasts:        Right: Normal. No swelling, bleeding, inverted nipple, mass, nipple discharge, skin change or tenderness.        Left: Normal. No swelling, bleeding, inverted nipple, mass, nipple discharge, skin change or tenderness.  Abdominal:     General: Bowel sounds are normal.     Palpations: Abdomen is soft. There is no hepatomegaly or splenomegaly.     Hernia: There is no hernia in the right  inguinal area or left inguinal area.  Genitourinary:    Labia:        Right: No rash, tenderness or lesion.        Left: No rash, tenderness or lesion.      Vagina: Normal.     Cervix: Normal.     Uterus: Normal.      Adnexa: Right adnexa normal and left adnexa normal.     Comments: Cervix visualized at 12 o'clock aspect.  Mild thick, white discharge noted. Wet prep obtained prior to pap.   Musculoskeletal:     Right lower leg: No edema.     Left lower leg: No edema.  Lymphadenopathy:     Cervical: No cervical adenopathy.     Upper Body:     Right upper body: No supraclavicular, axillary or pectoral adenopathy.     Left upper body: No supraclavicular, axillary or pectoral adenopathy.  Skin:    General: Skin is warm and dry.  Neurological:     Mental Status: She is alert and oriented to person, place, and time.  Psychiatric:        Attention and Perception: Attention normal.        Mood and Affect: Mood normal.        Behavior: Behavior normal. Behavior is cooperative.        Thought Content: Thought content normal.        Judgment: Judgment normal.     Results for orders placed or performed in visit on 01/19/19  WET PREP FOR TRICH, YEAST, CLUE  Result Value Ref Range   Trichomonas Exam Negative Negative   Yeast Exam Positive (A) Negative   Clue Cell Exam Negative Negative      Assessment & Plan:   Problem List Items Addressed This Visit      Genitourinary   Yeast vaginitis    Wet prep positive for yeast.  Diflucan script sent and discussed with patient.        Relevant Medications   fluconazole (DIFLUCAN) 150 MG tablet   Other Relevant Orders   WET PREP FOR TRICH, YEAST, CLUE (Completed)     Other   Moderate episode of recurrent major depressive disorder (HCC)    Chronic, ongoing, suspect some PTSD due to past traumatic experience with loss of son.  Continue current medication regimen and collaboration with psychiatry.  Referral to [place] psych to obtain  therapy, which would benefit patient due to past history and current triggers.  She is aware all psychiatry medications are to remain to be obtained from her psychiatrist.        Relevant Orders   Ambulatory referral to Psychology   Obesity    Recommend continued focus on health diet choices and regular physical activity (30 minutes 5 days a week). Focus on small losses at at time vs attempting to lose large amounts of weight at once.      Nicotine dependence, cigarettes, uncomplicated    I have recommended complete cessation of tobacco use. I have discussed various options available for assistance with tobacco cessation including over the counter methods (Nicotine gum, patch and lozenges). We also discussed prescription options (Chantix, Nicotine Inhaler / Nasal Spray). The patient is not interested in pursuing any prescription tobacco cessation options at this time.      Elevated LDL cholesterol level    Repeat lipid panel today, she reports she is fasting.  ASCVD 7.8% on last labs.  Add statin as needed and discuss with patient.  She has been focused on diet.      Relevant Orders   Lipid Panel w/o Chol/HDL Ratio    Other Visit Diagnoses    Encounter for annual physical exam    -  Primary   Relevant Orders   Ambulatory referral to Psychology   Cytology - PAP   Need for hepatitis C screening test       Hep C ordered on labs   Relevant Orders   Hepatitis C Antibody   Tdap vaccine greater than or equal to 7yo IM (Completed)   MM DIGITAL SCREENING BILATERAL   Encounter for screening for HIV       HIV ordered on labs   Relevant Orders   HIV antibody (with reflex)   Colon cancer screening       GI referral placed   Relevant Orders   Ambulatory referral to Gastroenterology       Follow up plan: Return in about 6 months (around 07/22/2019) for Mood and cholesterol check.   LABORATORY TESTING:  - Pap smear: pap done  IMMUNIZATIONS:   - Tdap: Tetanus vaccination status  reviewed: Td vaccination indicated and given today. - Influenza: Up to date - Pneumovax: Not applicable - Prevnar: Not applicable - HPV: Not applicable - Zostavax vaccine: Refused  SCREENING: -Mammogram: Ordered today  - Colonoscopy: Ordered today  - Bone Density: Not applicable  -Hearing Test: Not applicable  -Spirometry: Not applicable   PATIENT COUNSELING:   Advised to take 1 mg of folate supplement per  day if capable of pregnancy.   Sexuality: Discussed sexually transmitted diseases, partner selection, use of condoms, avoidance of unintended pregnancy  and contraceptive alternatives.   Advised to avoid cigarette smoking.  I discussed with the patient that most people either abstain from alcohol or drink within safe limits (<=14/week and <=4 drinks/occasion for males, <=7/weeks and <= 3 drinks/occasion for females) and that the risk for alcohol disorders and other health effects rises proportionally with the number of drinks per week and how often a drinker exceeds daily limits.  Discussed cessation/primary prevention of drug use and availability of treatment for abuse.   Diet: Encouraged to adjust caloric intake to maintain  or achieve ideal body weight, to reduce intake of dietary saturated fat and total fat, to limit sodium intake by avoiding high sodium foods and not adding table salt, and to maintain adequate dietary potassium and calcium preferably from fresh fruits, vegetables, and low-fat dairy products.    stressed the importance of regular exercise  Injury prevention: Discussed safety belts, safety helmets, smoke detector, smoking near bedding or upholstery.   Dental health: Discussed importance of regular tooth brushing, flossing, and dental visits.    NEXT PREVENTATIVE PHYSICAL DUE IN 1 YEAR. Return in about 6 months (around 07/22/2019) for Mood and cholesterol check.

## 2019-01-19 NOTE — Assessment & Plan Note (Signed)
Chronic, ongoing, suspect some PTSD due to past traumatic experience with loss of son.  Continue current medication regimen and collaboration with psychiatry.  Referral to [place] psych to obtain therapy, which would benefit patient due to past history and current triggers.  She is aware all psychiatry medications are to remain to be obtained from her psychiatrist.

## 2019-01-19 NOTE — Assessment & Plan Note (Signed)
Wet prep positive for yeast.  Diflucan script sent and discussed with patient.

## 2019-01-20 ENCOUNTER — Telehealth: Payer: Self-pay

## 2019-01-20 ENCOUNTER — Other Ambulatory Visit: Payer: Self-pay

## 2019-01-20 DIAGNOSIS — Z1211 Encounter for screening for malignant neoplasm of colon: Secondary | ICD-10-CM

## 2019-01-20 LAB — LIPID PANEL W/O CHOL/HDL RATIO
Cholesterol, Total: 169 mg/dL (ref 100–199)
HDL: 49 mg/dL (ref >39)
LDL Calculated: 101 mg/dL — ABNORMAL HIGH (ref 0–99)
Triglycerides: 93 mg/dL (ref 0–149)
VLDL Cholesterol Cal: 19 mg/dL (ref 5–40)

## 2019-01-20 LAB — HIV ANTIBODY (ROUTINE TESTING W REFLEX): HIV Screen 4th Generation wRfx: NONREACTIVE

## 2019-01-20 LAB — HEPATITIS C ANTIBODY: Hep C Virus Ab: <0.1 {s_co_ratio} (ref 0.0–0.9)

## 2019-01-20 MED ORDER — NA SULFATE-K SULFATE-MG SULF 17.5-3.13-1.6 GM/177ML PO SOLN: 1.0000 | Freq: Once | ORAL | 0 refills | Status: AC

## 2019-01-20 NOTE — Telephone Encounter (Signed)
Gastroenterology Pre-Procedure Review  Request Date: 02/02/19 Requesting Physician: Dr. Marius Ditch  PATIENT REVIEW QUESTIONS: The patient responded to the following health history questions as indicated:    1. Are you having any GI issues? no 2. Do you have a personal history of Polyps? no 3. Do you have a family history of Colon Cancer or Polyps? no 4. Diabetes Mellitus? no 5. Joint replacements in the past 12 months?no 6. Major health problems in the past 3 months?no 7. Any artificial heart valves, MVP, or defibrillator?no    MEDICATIONS & ALLERGIES:    Patient reports the following regarding taking any anticoagulation/antiplatelet therapy:   Plavix, Coumadin, Eliquis, Xarelto, Lovenox, Pradaxa, Brilinta, or Effient? no Aspirin? no  Patient confirms/reports the following medications:  Current Outpatient Medications  Medication Sig Dispense Refill  . ARIPiprazole (ABILIFY) 5 MG tablet     . Cholecalciferol 1.25 MG (50000 UT) capsule Take 1 capsule (50,000 Units total) by mouth once a week. 8 capsule 0  . FLUoxetine (PROZAC) 20 MG capsule Take by mouth.    Marland Kitchen LORazepam (ATIVAN) 1 MG tablet Take by mouth.    . Na Sulfate-K Sulfate-Mg Sulf 17.5-3.13-1.6 GM/177ML SOLN Take 1 kit by mouth once for 1 dose. 354 mL 0   No current facility-administered medications for this visit.     Patient confirms/reports the following allergies:  No Known Allergies  No orders of the defined types were placed in this encounter.   AUTHORIZATION INFORMATION Primary Insurance: 1D#: Group #:  Secondary Insurance: 1D#: Group #:  SCHEDULE INFORMATION: Date: MSCTime: Location:02/02/19

## 2019-01-21 LAB — CYTOLOGY - PAP
Adequacy: ABSENT
Diagnosis: NEGATIVE
HPV: NOT DETECTED

## 2019-01-21 MED ORDER — ATORVASTATIN CALCIUM 10 MG PO TABS: 10.0000 mg | ORAL_TABLET | Freq: Every day | ORAL | 3 refills | Status: DC

## 2019-01-21 NOTE — Addendum Note (Signed)
Addended by: Marnee Guarneri T on: 01/21/2019 05:03 PM   Modules accepted: Orders

## 2019-01-27 ENCOUNTER — Encounter: Payer: Self-pay | Admitting: *Deleted

## 2019-01-27 ENCOUNTER — Other Ambulatory Visit: Payer: Self-pay

## 2019-01-27 ENCOUNTER — Other Ambulatory Visit: Admission: RE | Admit: 2019-01-27 | Payer: Managed Care, Other (non HMO) | Source: Ambulatory Visit

## 2019-01-27 ENCOUNTER — Inpatient Hospital Stay: Admission: RE | Admit: 2019-01-27 | Payer: Managed Care, Other (non HMO) | Source: Ambulatory Visit

## 2019-01-28 ENCOUNTER — Other Ambulatory Visit
Admission: RE | Admit: 2019-01-28 | Discharge: 2019-01-28 | Disposition: A | Discharge status: Home / Self Care | Payer: Managed Care, Other (non HMO) | Source: Ambulatory Visit | Attending: Gastroenterology | Admitting: Gastroenterology

## 2019-01-28 ENCOUNTER — Other Ambulatory Visit: Payer: Managed Care, Other (non HMO)

## 2019-01-28 ENCOUNTER — Other Ambulatory Visit: Admission: RE | Admit: 2019-01-28 | Payer: Managed Care, Other (non HMO) | Source: Ambulatory Visit

## 2019-01-28 DIAGNOSIS — Z01812 Encounter for preprocedural laboratory examination: Secondary | ICD-10-CM | POA: Diagnosis not present

## 2019-01-28 DIAGNOSIS — Z1159 Encounter for screening for other viral diseases: Secondary | ICD-10-CM | POA: Insufficient documentation

## 2019-01-29 LAB — NOVEL CORONAVIRUS, NAA (HOSP ORDER, SEND-OUT TO REF LAB; TAT 18-24 HRS): SARS-CoV-2, NAA: NOT DETECTED

## 2019-02-01 NOTE — Discharge Instructions (Signed)
General Anesthesia, Adult, Care After  This sheet gives you information about how to care for yourself after your procedure. Your health care provider may also give you more specific instructions. If you have problems or questions, contact your health care provider.  What can I expect after the procedure?  After the procedure, the following side effects are common:  Pain or discomfort at the IV site.  Nausea.  Vomiting.  Sore throat.  Trouble concentrating.  Feeling cold or chills.  Weak or tired.  Sleepiness and fatigue.  Soreness and body aches. These side effects can affect parts of the body that were not involved in surgery.  Follow these instructions at home:    For at least 24 hours after the procedure:  Have a responsible adult stay with you. It is important to have someone help care for you until you are awake and alert.  Rest as needed.  Do not:  Participate in activities in which you could fall or become injured.  Drive.  Use heavy machinery.  Drink alcohol.  Take sleeping pills or medicines that cause drowsiness.  Make important decisions or sign legal documents.  Take care of children on your own.  Eating and drinking  Follow any instructions from your health care provider about eating or drinking restrictions.  When you feel hungry, start by eating small amounts of foods that are soft and easy to digest (bland), such as toast. Gradually return to your regular diet.  Drink enough fluid to keep your urine pale yellow.  If you vomit, rehydrate by drinking water, juice, or clear broth.  General instructions  If you have sleep apnea, surgery and certain medicines can increase your risk for breathing problems. Follow instructions from your health care provider about wearing your sleep device:  Anytime you are sleeping, including during daytime naps.  While taking prescription pain medicines, sleeping medicines, or medicines that make you drowsy.  Return to your normal activities as told by your health care  provider. Ask your health care provider what activities are safe for you.  Take over-the-counter and prescription medicines only as told by your health care provider.  If you smoke, do not smoke without supervision.  Keep all follow-up visits as told by your health care provider. This is important.  Contact a health care provider if:  You have nausea or vomiting that does not get better with medicine.  You cannot eat or drink without vomiting.  You have pain that does not get better with medicine.  You are unable to pass urine.  You develop a skin rash.  You have a fever.  You have redness around your IV site that gets worse.  Get help right away if:  You have difficulty breathing.  You have chest pain.  You have blood in your urine or stool, or you vomit blood.  Summary  After the procedure, it is common to have a sore throat or nausea. It is also common to feel tired.  Have a responsible adult stay with you for the first 24 hours after general anesthesia. It is important to have someone help care for you until you are awake and alert.  When you feel hungry, start by eating small amounts of foods that are soft and easy to digest (bland), such as toast. Gradually return to your regular diet.  Drink enough fluid to keep your urine pale yellow.  Return to your normal activities as told by your health care provider. Ask your health care   provider what activities are safe for you.  This information is not intended to replace advice given to you by your health care provider. Make sure you discuss any questions you have with your health care provider.  Document Released: 11/17/2000 Document Revised: 03/27/2017 Document Reviewed: 03/27/2017  Elsevier Interactive Patient Education  2019 [employer]

## 2019-02-02 ENCOUNTER — Other Ambulatory Visit: Payer: Self-pay

## 2019-02-02 ENCOUNTER — Ambulatory Visit: Payer: Managed Care, Other (non HMO) | Admitting: Anesthesiology

## 2019-02-02 ENCOUNTER — Ambulatory Visit
Admission: RE | Admit: 2019-02-02 | Discharge: 2019-02-02 | Disposition: A | Discharge status: Home / Self Care | Payer: Managed Care, Other (non HMO) | Attending: Gastroenterology | Admitting: Gastroenterology

## 2019-02-02 ENCOUNTER — Encounter
Admission: RE | Disposition: A | Discharge status: Home / Self Care | Payer: Self-pay | Source: Home / Self Care | Attending: Gastroenterology

## 2019-02-02 DIAGNOSIS — K5730 Diverticulosis of large intestine without perforation or abscess without bleeding: Secondary | ICD-10-CM | POA: Diagnosis not present

## 2019-02-02 DIAGNOSIS — Z1211 Encounter for screening for malignant neoplasm of colon: Secondary | ICD-10-CM | POA: Insufficient documentation

## 2019-02-02 DIAGNOSIS — K635 Polyp of colon: Secondary | ICD-10-CM | POA: Diagnosis not present

## 2019-02-02 DIAGNOSIS — F419 Anxiety disorder, unspecified: Secondary | ICD-10-CM | POA: Diagnosis not present

## 2019-02-02 DIAGNOSIS — Z79899 Other long term (current) drug therapy: Secondary | ICD-10-CM | POA: Diagnosis not present

## 2019-02-02 DIAGNOSIS — F17210 Nicotine dependence, cigarettes, uncomplicated: Secondary | ICD-10-CM | POA: Insufficient documentation

## 2019-02-02 DIAGNOSIS — F329 Major depressive disorder, single episode, unspecified: Secondary | ICD-10-CM | POA: Diagnosis not present

## 2019-02-02 DIAGNOSIS — D123 Benign neoplasm of transverse colon: Secondary | ICD-10-CM | POA: Diagnosis not present

## 2019-02-02 HISTORY — DX: Dizziness and giddiness: R42

## 2019-02-02 HISTORY — PX: COLONOSCOPY WITH PROPOFOL: SHX5780

## 2019-02-02 HISTORY — PX: POLYPECTOMY: SHX5525

## 2019-02-02 HISTORY — DX: Presence of dental prosthetic device (complete) (partial): Z97.2

## 2019-02-02 SURGERY — COLONOSCOPY WITH PROPOFOL
Anesthesia: General

## 2019-02-02 MED ORDER — PROPOFOL 10 MG/ML IV BOLUS
INTRAVENOUS | Status: DC | PRN
  Administered 2019-02-02 (×9): 20 mg via INTRAVENOUS
  Administered 2019-02-02: 30 mg via INTRAVENOUS
  Administered 2019-02-02: 70 mg via INTRAVENOUS

## 2019-02-02 MED ORDER — STERILE WATER FOR IRRIGATION IR SOLN
Status: DC | PRN
  Administered 2019-02-02: 5 mL

## 2019-02-02 MED ORDER — LACTATED RINGERS IV SOLN
10.0000 mL/h | INTRAVENOUS | Status: DC
  Administered 2019-02-02: 08:00:00 via INTRAVENOUS

## 2019-02-02 MED ORDER — LACTATED RINGERS IV SOLN
INTRAVENOUS | Status: DC
  Administered 2019-02-02: 08:00:00 via INTRAVENOUS

## 2019-02-02 MED ORDER — LIDOCAINE HCL (CARDIAC) PF 100 MG/5ML IV SOSY
PREFILLED_SYRINGE | INTRAVENOUS | Status: DC | PRN
  Administered 2019-02-02: 40 mg via INTRAVENOUS

## 2019-02-02 SURGICAL SUPPLY — 25 items
CANISTER SUCT 1200ML W/VALVE (MISCELLANEOUS) ×4 IMPLANT
CLIP HMST 235XBRD CATH ROT (MISCELLANEOUS) IMPLANT
CLIP RESOLUTION 360 11X235 (MISCELLANEOUS)
ELECT REM PT RETURN 9FT ADLT (ELECTROSURGICAL)
ELECTRODE REM PT RTRN 9FT ADLT (ELECTROSURGICAL) IMPLANT
FCP ESCP3.2XJMB 240X2.8X (MISCELLANEOUS)
FORCEPS BIOP RAD 4 LRG CAP 4 (CUTTING FORCEPS) IMPLANT
FORCEPS BIOP RJ4 240 W/NDL (MISCELLANEOUS)
FORCEPS ESCP3.2XJMB 240X2.8X (MISCELLANEOUS) IMPLANT
GOWN CVR UNV OPN BCK APRN NK (MISCELLANEOUS) ×4 IMPLANT
GOWN ISOL THUMB LOOP REG UNIV (MISCELLANEOUS) ×4
INJECTOR VARIJECT VIN23 (MISCELLANEOUS) IMPLANT
KIT DEFENDO VALVE AND CONN (KITS) IMPLANT
KIT ENDO PROCEDURE OLY (KITS) ×4 IMPLANT
MARKER SPOT ENDO TATTOO 5ML (MISCELLANEOUS) IMPLANT
PROBE APC STR FIRE (PROBE) IMPLANT
RETRIEVER NET ROTH 2.5X230 LF (MISCELLANEOUS) IMPLANT
SNARE COLD EXACTO (MISCELLANEOUS) ×4 IMPLANT
SNARE SHORT THROW 13M SML OVAL (MISCELLANEOUS) IMPLANT
SNARE SHORT THROW 30M LRG OVAL (MISCELLANEOUS) IMPLANT
SNARE SNG USE RND 15MM (INSTRUMENTS) IMPLANT
SPOT EX ENDOSCOPIC TATTOO (MISCELLANEOUS)
TRAP ETRAP POLY (MISCELLANEOUS) ×4 IMPLANT
VARIJECT INJECTOR VIN23 (MISCELLANEOUS)
WATER STERILE IRR 250ML POUR (IV SOLUTION) ×4 IMPLANT

## 2019-02-02 NOTE — Transfer of Care (Signed)
Immediate Anesthesia Transfer of Care Note  Patient: Heather Hill  Procedure(s) Performed: COLONOSCOPY WITH PROPOFOL (N/A ) POLYPECTOMY  Patient Location: PACU  Anesthesia Type: General  Level of Consciousness: awake, alert  and patient cooperative  Airway and Oxygen Therapy: Patient Spontanous Breathing and Patient connected to supplemental oxygen  Post-op Assessment: Post-op Vital signs reviewed, Patient's Cardiovascular Status Stable, Respiratory Function Stable, Patent Airway and No signs of Nausea or vomiting  Post-op Vital Signs: Reviewed and stable  Complications: No apparent anesthesia complications

## 2019-02-02 NOTE — Op Note (Signed)
[facility] Gastroenterology Patient Name: Heather Hill Procedure Date: 02/02/2019 7:37 AM MRN: [id number] Account #: [id number] Date of Birth: May 27, 1956 Admit Type: Outpatient Age: 63 Room: [facility] OR ROOM 01 Gender: Female Note Status: Finalized Procedure:            Colonoscopy Indications:          Screening for colorectal malignant neoplasm, This is                        the patient's first colonoscopy Providers:            Lin Landsman MD, MD Referring MD:         Guadalupe Maple, MD (Referring MD) Medicines:            Monitored Anesthesia Care Complications:        No immediate complications. Estimated blood loss: None. Procedure:            Pre-Anesthesia Assessment:                       - Prior to the procedure, a History and Physical was                        performed, and patient medications and allergies were                        reviewed. The patient is competent. The risks and                        benefits of the procedure and the sedation options and                        risks were discussed with the patient. All questions                        were answered and informed consent was obtained.                        Patient identification and proposed procedure were                        verified by the physician, the nurse, the                        anesthesiologist, the anesthetist and the technician in                        the pre-procedure area in the procedure room in the                        endoscopy suite. Mental Status Examination: alert and                        oriented. Airway Examination: normal oropharyngeal                        airway and neck mobility. Respiratory Examination:                        clear to auscultation. CV Examination: normal.  Prophylactic Antibiotics: The patient does not require                        prophylactic antibiotics. Prior Anticoagulants: The               patient has taken no previous anticoagulant or                        antiplatelet agents. ASA Grade Assessment: II - A                        patient with mild systemic disease. After reviewing the                        risks and benefits, the patient was deemed in                        satisfactory condition to undergo the procedure. The                        anesthesia plan was to use monitored anesthesia care                        (MAC). Immediately prior to administration of                        medications, the patient was re-assessed for adequacy                        to receive sedatives. The heart rate, respiratory rate,                        oxygen saturations, blood pressure, adequacy of                        pulmonary ventilation, and response to care were                        monitored throughout the procedure. The physical status                        of the patient was re-assessed after the procedure.                       After obtaining informed consent, the colonoscope was                        passed under direct vision. Throughout the procedure,                        the patient's blood pressure, pulse, and oxygen                        saturations were monitored continuously. The was                        introduced through the anus and advanced to the the                        cecum, identified by appendiceal orifice and ileocecal  valve. The colonoscopy was performed with moderate                        difficulty due to multiple diverticula in the colon.                        Successful completion of the procedure was aided by                        applying abdominal pressure. The patient tolerated the                        procedure well. The quality of the bowel preparation                        was evaluated using the BBPS [facility] Bowel Preparation                        Scale) with scores of: Right Colon = 3,  Transverse                        Colon = 3 and Left Colon = 3 (entire mucosa seen well                        with no residual staining, small fragments of stool or                        opaque liquid). The total BBPS score equals 9. Findings:      The perianal and digital rectal examinations were normal. Pertinent       negatives include normal sphincter tone and no palpable rectal lesions.      Five sessile polyps were found in the sigmoid colon, descending colon       and transverse colon. The polyps were 5 mm in size. These polyps were       removed with a cold snare. Resection and retrieval were complete.      The retroflexed view of the distal rectum and anal verge was normal and       showed no anal or rectal abnormalities.      Multiple diverticula were found in the sigmoid colon. There was no       evidence of diverticular bleeding. Impression:           - Five 5 mm polyps in the sigmoid colon, in the                        descending colon and in the transverse colon, removed                        with a cold snare. Resected and retrieved.                       - The distal rectum and anal verge are normal on                        retroflexion view.                       - Severe diverticulosis in the sigmoid colon. There  was                        no evidence of diverticular bleeding. Recommendation:       - Discharge patient to home (with escort).                       - Resume previous diet today.                       - Continue present medications.                       - Await pathology results.                       - Repeat colonoscopy in 5-10 years for surveillance                        based on pathology results. Procedure Code(s):    --- Professional ---                       [phone], Colonoscopy, flexible; with removal of tumor(s),                        polyp(s), or other lesion(s) by snare technique Diagnosis Code(s):    --- Professional ---                        Z12.11, Encounter for screening for malignant neoplasm                        of colon                       K63.5, Polyp of colon                       K57.30, Diverticulosis of large intestine without                        perforation or abscess without bleeding CPT copyright 2019 American Medical Association. All rights reserved. The codes documented in this report are preliminary and upon coder review may  be revised to meet current compliance requirements. Dr. Ulyess Mort Lin Landsman MD, MD 02/02/2019 8:45:35 AM This report has been signed electronically. Number of Addenda: 0 Note Initiated On: 02/02/2019 7:37 AM Scope Withdrawal Time: 0 hours 14 minutes 16 seconds  Total Procedure Duration: 0 hours 19 minutes 47 seconds  Estimated Blood Loss: Estimated blood loss: none.      [facility]

## 2019-02-02 NOTE — Anesthesia Preprocedure Evaluation (Signed)
Anesthesia Evaluation  Patient identified by MRN, date of birth, ID band Patient awake    Reviewed: Allergy & Precautions, H&P , NPO status , Patient's Chart, lab work & pertinent test results  Airway Mallampati: II  TM Distance: >3 FB Neck ROM: full    Dental  (+) Dental Advidsory Given, Missing, Poor Dentition, Loose   Pulmonary Current Smoker,    Pulmonary exam normal breath sounds clear to auscultation       Cardiovascular Normal cardiovascular exam Rhythm:regular Rate:Normal     Neuro/Psych    GI/Hepatic   Endo/Other    Renal/GU      Musculoskeletal   Abdominal   Peds  Hematology   Anesthesia Other Findings   Reproductive/Obstetrics                             Anesthesia Physical Anesthesia Plan  ASA: II  Anesthesia Plan: General   Post-op Pain Management:    Induction: Intravenous  PONV Risk Score and Plan: 3 and 2 and Propofol infusion and Treatment may vary due to age or medical condition  Airway Management Planned: Natural Airway  Additional Equipment:   Intra-op Plan:   Post-operative Plan:   Informed Consent: I have reviewed the patients History and Physical, chart, labs and discussed the procedure including the risks, benefits and alternatives for the proposed anesthesia with the patient or authorized representative who has indicated his/her understanding and acceptance.       Plan Discussed with: CRNA  Anesthesia Plan Comments:         Anesthesia Quick Evaluation

## 2019-02-02 NOTE — H&P (Signed)
Heather Darby, MD [address]  [place]  [place], [place] [address]  Main: [phone]  Fax: [phone] Pager: [phone]  Primary Care Physician:  Venita Lick, NP Primary Gastroenterologist:  Dr. Cephas Hill  Pre-Procedure History & Physical: HPI:  Heather Hill is a 63 y.o. female is here for an colonoscopy.   Past Medical History:  Diagnosis Date  . Anxiety   . Depression   . Vertigo    none for over 8 yrs  . Wears dentures    partial upper    Past Surgical History:  Procedure Laterality Date  . ABDOMINAL HYSTERECTOMY    . TUBAL LIGATION      Prior to Admission medications   Medication Sig Start Date End Date Taking? Authorizing Provider  ARIPiprazole (ABILIFY) 5 MG tablet  10/26/18  Yes [provider]  atorvastatin (LIPITOR) 10 MG tablet Take 1 tablet (10 mg total) by mouth daily. 01/21/19  Yes Cannady, Jolene T, NP  Cholecalciferol 1.25 MG (50000 UT) capsule Take 1 capsule (50,000 Units total) by mouth once a week. 11/17/18  Yes Cannady, Jolene T, NP  FLUoxetine (PROZAC) 20 MG capsule Take by mouth.   Yes [provider]  LORazepam (ATIVAN) 1 MG tablet Take by mouth.   Yes [provider]    Allergies as of 01/20/2019  . (No Known Allergies)    Family History  Problem Relation Age of Onset  . Diabetes Mother   . Alzheimer's disease Mother   . Hypertension Father   . Asthma Father   . Diabetes Father   . Bipolar disorder Sister   . Diabetes Sister   . Diabetes Brother   . Diabetes Maternal Grandfather   . Alzheimer's disease Maternal Grandfather   . Heart disease Maternal Grandfather   . Asthma Paternal Grandfather   . Diabetes Paternal Grandfather   . Hypertension Paternal Grandfather     Social History   Socioeconomic History  . Marital status: Married    Spouse name: Not on file  . Number of children: Not on file  . Years of education: Not on file  . Highest education level: Not on file   Occupational History  . Occupation: retired  Scientific laboratory technician  . Financial resource strain: Not very hard  . Food insecurity:    Worry: Never true    Inability: Never true  . Transportation needs:    Medical: No    Non-medical: No  Tobacco Use  . Smoking status: Current Every Day Smoker    Packs/day: 0.50    Years: 48.00    Pack years: 24.00  . Smokeless tobacco: Never Used  . Tobacco comment: she currently uses a patch  Substance and Sexual Activity  . Alcohol use: Yes    Alcohol/week: 2.0 standard drinks    Types: 2 Glasses of wine per week  . Drug use: Never  . Sexual activity: Not Currently  Lifestyle  . Physical activity:    Days per week: 0 days    Minutes per session: 0 min  . Stress: Not on file  Relationships  . Social connections:    Talks on phone: Three times a week    Gets together: Three times a week    Attends religious service: Never    Active member of club or organization: No    Attends meetings of clubs or organizations: Never    Relationship status: Married  . Intimate partner violence:    Fear of current  or ex partner: No    Emotionally abused: No    Physically abused: No    Forced sexual activity: No  Other Topics Concern  . Not on file  Social History Narrative  . Not on file    Review of Systems: See HPI, otherwise negative ROS  Physical Exam: BP 98/69   Pulse 78   Temp (!) 97.3 F (36.3 C)   Resp 17   Ht 5\' 4"  (1.626 m)   Wt 87.5 kg   SpO2 99%   BMI 33.13 kg/m  General:   Alert,  pleasant and cooperative in NAD Head:  Normocephalic and atraumatic. Neck:  Supple; no masses or thyromegaly. Lungs:  Clear throughout to auscultation.    Heart:  Regular rate and rhythm. Abdomen:  Soft, nontender and nondistended. Normal bowel sounds, without guarding, and without rebound.   Neurologic:  Alert and  oriented x4;  grossly normal neurologically.  Impression/Plan: Heather Hill is here for an colonoscopy to be performed for colon  cancer screening  Risks, benefits, limitations, and alternatives regarding  colonoscopy have been reviewed with the patient.  Questions have been answered.  All parties agreeable.   Sherri Sear, MD  02/02/2019, 8:10 AM

## 2019-02-02 NOTE — Anesthesia Procedure Notes (Signed)
Performed by: Jayveon Convey, CRNA Pre-anesthesia Checklist: Patient identified, Emergency Drugs available, Suction available, Timeout performed and Patient being monitored Patient Re-evaluated:Patient Re-evaluated prior to induction Oxygen Delivery Method: Nasal cannula Placement Confirmation: positive ETCO2       

## 2019-02-02 NOTE — Anesthesia Postprocedure Evaluation (Signed)
Anesthesia Post Note  Patient: Heather Hill  Procedure(s) Performed: COLONOSCOPY WITH PROPOFOL (N/A ) POLYPECTOMY  Patient location during evaluation: PACU Anesthesia Type: General Level of consciousness: awake and alert and oriented Pain management: satisfactory to patient Vital Signs Assessment: post-procedure vital signs reviewed and stable Respiratory status: spontaneous breathing, nonlabored ventilation and respiratory function stable Cardiovascular status: blood pressure returned to baseline and stable Postop Assessment: Adequate PO intake and No signs of nausea or vomiting Anesthetic complications: no    Raliegh Ip

## 2019-02-03 ENCOUNTER — Encounter: Payer: Self-pay | Admitting: Gastroenterology

## 2019-02-04 ENCOUNTER — Encounter: Payer: Self-pay | Admitting: Gastroenterology

## 2019-02-23 ENCOUNTER — Ambulatory Visit: Payer: Managed Care, Other (non HMO) | Admitting: Nurse Practitioner

## 2019-02-28 ENCOUNTER — Encounter: Payer: Self-pay | Admitting: Nurse Practitioner

## 2019-02-28 ENCOUNTER — Other Ambulatory Visit: Payer: Self-pay

## 2019-02-28 ENCOUNTER — Ambulatory Visit: Payer: Managed Care, Other (non HMO) | Admitting: Nurse Practitioner

## 2019-02-28 VITALS — BP 107/75 | HR 95 | Temp 98.3°F | Wt 195.4 lb

## 2019-02-28 DIAGNOSIS — E782 Mixed hyperlipidemia: Secondary | ICD-10-CM | POA: Diagnosis not present

## 2019-02-28 LAB — LIPID PANEL PICCOLO, WAIVED
Chol/HDL Ratio Piccolo,Waive: 2.9 mg/dL
Cholesterol Piccolo, Waived: 155 mg/dL (ref <200)
HDL Chol Piccolo, Waived: 54 mg/dL — ABNORMAL LOW (ref >59)
LDL Chol Calc Piccolo Waived: 86 mg/dL (ref <100)
Triglycerides Piccolo,Waived: 80 mg/dL (ref <150)
VLDL Chol Calc Piccolo,Waive: 16 mg/dL (ref <30)

## 2019-02-28 MED ORDER — ATORVASTATIN CALCIUM 10 MG PO TABS: 10.0000 mg | ORAL_TABLET | Freq: Every day | ORAL | 2 refills | Status: DC

## 2019-02-28 NOTE — Assessment & Plan Note (Signed)
Chronic, LDL 117 and 101 with slight elevation in risk score (ASCVD) and smoker.  LDL 86 today and TCHOL 155.  Will continue statin. Does not want CCM referral to discuss medication assistance at this time.  Return in 6 months.

## 2019-02-28 NOTE — Patient Instructions (Signed)
Fat and Cholesterol Restricted Eating Plan Getting too much fat and cholesterol in your diet may cause health problems. Choosing the right foods helps keep your fat and cholesterol at normal levels. This can keep you from getting certain diseases. Your doctor may recommend an eating plan that includes:  Total fat: ______% or less of total calories a day.  Saturated fat: ______% or less of total calories a day.  Cholesterol: less than _________mg a day.  Fiber: ______g a day. What are tips for following this plan? Meal planning  At meals, divide your plate into four equal parts: ? Fill one-half of your plate with vegetables and green salads. ? Fill one-fourth of your plate with whole grains. ? Fill one-fourth of your plate with low-fat (lean) protein foods.  Eat fish that is high in omega-3 fats at least two times a week. This includes mackerel, tuna, sardines, and salmon.  Eat foods that are high in fiber, such as whole grains, beans, apples, broccoli, carrots, peas, and barley. General tips   Work with your doctor to lose weight if you need to.  Avoid: ? Foods with added sugar. ? Fried foods. ? Foods with partially hydrogenated oils.  Limit alcohol intake to no more than 1 drink a day for nonpregnant women and 2 drinks a day for men. One drink equals 12 oz of beer, 5 oz of wine, or 1 oz of hard liquor. Reading food labels  Check food labels for: ? Trans fats. ? Partially hydrogenated oils. ? Saturated fat (g) in each serving. ? Cholesterol (mg) in each serving. ? Fiber (g) in each serving.  Choose foods with healthy fats, such as: ? Monounsaturated fats. ? Polyunsaturated fats. ? Omega-3 fats.  Choose grain products that have whole grains. Look for the word "whole" as the first word in the ingredient list. Cooking  Cook foods using low-fat methods. These include baking, boiling, grilling, and broiling.  Eat more home-cooked foods. Eat at restaurants and buffets  less often.  Avoid cooking using saturated fats, such as butter, cream, palm oil, palm kernel oil, and coconut oil. Recommended foods  Fruits  All fresh, canned (in natural juice), or frozen fruits. Vegetables  Fresh or frozen vegetables (raw, steamed, roasted, or grilled). Green salads. Grains  Whole grains, such as whole wheat or whole grain breads, crackers, cereals, and pasta. Unsweetened oatmeal, bulgur, barley, quinoa, or brown rice. Corn or whole wheat flour tortillas. Meats and other protein foods  Ground beef (85% or leaner), grass-fed beef, or beef trimmed of fat. Skinless chicken or turkey. Ground chicken or turkey. Pork trimmed of fat. All fish and seafood. Egg whites. Dried beans, peas, or lentils. Unsalted nuts or seeds. Unsalted canned beans. Nut butters without added sugar or oil. Dairy  Low-fat or nonfat dairy products, such as skim or 1% milk, 2% or reduced-fat cheeses, low-fat and fat-free ricotta or cottage cheese, or plain low-fat and nonfat yogurt. Fats and oils  Tub margarine without trans fats. Light or reduced-fat mayonnaise and salad dressings. Avocado. Olive, canola, sesame, or safflower oils. The items listed above may not be a complete list of foods and beverages you can eat. Contact a dietitian for more information. Foods to avoid Fruits  Canned fruit in heavy syrup. Fruit in cream or butter sauce. Fried fruit. Vegetables  Vegetables cooked in cheese, cream, or butter sauce. Fried vegetables. Grains  White bread. White pasta. White rice. Cornbread. Bagels, pastries, and croissants. Crackers and snack foods that contain trans fat   and hydrogenated oils. Meats and other protein foods  Fatty cuts of meat. Ribs, chicken wings, bacon, sausage, bologna, salami, chitterlings, fatback, hot dogs, bratwurst, and packaged lunch meats. Liver and organ meats. Whole eggs and egg yolks. Chicken and turkey with skin. Fried meat. Dairy  Whole or 2% milk, cream,  half-and-half, and cream cheese. Whole milk cheeses. Whole-fat or sweetened yogurt. Full-fat cheeses. Nondairy creamers and whipped toppings. Processed cheese, cheese spreads, and cheese curds. Beverages  Alcohol. Sugar-sweetened drinks such as sodas, lemonade, and fruit drinks. Fats and oils  Butter, stick margarine, lard, shortening, ghee, or bacon fat. Coconut, palm kernel, and palm oils. Sweets and desserts  Corn syrup, sugars, honey, and molasses. Candy. Jam and jelly. Syrup. Sweetened cereals. Cookies, pies, cakes, donuts, muffins, and ice cream. The items listed above may not be a complete list of foods and beverages you should avoid. Contact a dietitian for more information. Summary  Choosing the right foods helps keep your fat and cholesterol at normal levels. This can keep you from getting certain diseases.  At meals, fill one-half of your plate with vegetables and green salads.  Eat high-fiber foods, like whole grains, beans, apples, carrots, peas, and barley.  Limit added sugar, saturated fats, alcohol, and fried foods. This information is not intended to replace advice given to you by your health care provider. Make sure you discuss any questions you have with your health care provider. Document Released: 02/10/2012 Document Revised: 04/14/2018 Document Reviewed: 04/28/2017 Elsevier Patient Education  2020 Elsevier Inc.  

## 2019-02-28 NOTE — Progress Notes (Signed)
BP 107/75   Pulse 95   Temp 98.3 F (36.8 C) (Oral)   Wt 195 lb 6.4 oz (88.6 kg)   SpO2 99%   BMI 33.54 kg/m    Subjective:    Patient ID: Heather Hill, female    DOB: 12-11-1955, 63 y.o.   MRN: [id number]  HPI: Heather Hill is a 63 y.o. female  Chief Complaint  Patient presents with  . Hyperlipidemia    Out of lipid medication x 2 weeks due to finical issues.    HYPERLIPIDEMIA Was started on Atorvastatin 10 MG daily at last visit for elevation in LDL and cardiac risk.  Has been out of medication for two weeks and can not financially afford to pick up until end of this week per report.  She denies ADR with new medication. Hyperlipidemia status: good compliance Satisfied with current treatment?  yes Side effects:  no Medication compliance: good compliance Past cholesterol meds: atorvastain (lipitor) Supplements: none Aspirin:  no The 10-year ASCVD risk score Mikey Bussing DC Jr., et al., 2013) is: 6.8%   Values used to calculate the score:     Age: 42 years     Sex: Female     Is Non-Hispanic African American: Yes     Diabetic: No     Tobacco smoker: Yes     Systolic Blood Pressure: 846 mmHg     Is BP treated: No     HDL Cholesterol: 49 mg/dL     Total Cholesterol: 169 mg/dL Chest pain:  no Coronary artery disease:  no Family history CAD:  yes Family history early CAD:  no  Relevant past medical, surgical, family and social history reviewed and updated as indicated. Interim medical history since our last visit reviewed. Allergies and medications reviewed and updated.  Review of Systems  Constitutional: Negative for activity change, appetite change, diaphoresis, fatigue and fever.  Respiratory: Negative for cough, chest tightness and shortness of breath.   Cardiovascular: Negative for chest pain, palpitations and leg swelling.  Gastrointestinal: Negative for abdominal distention, abdominal pain, constipation, diarrhea, nausea and vomiting.  Musculoskeletal:  Negative.   Neurological: Negative for dizziness, syncope, weakness, light-headedness, numbness and headaches.  Psychiatric/Behavioral: Negative.     Per HPI unless specifically indicated above     Objective:    BP 107/75   Pulse 95   Temp 98.3 F (36.8 C) (Oral)   Wt 195 lb 6.4 oz (88.6 kg)   SpO2 99%   BMI 33.54 kg/m   Wt Readings from Last 3 Encounters:  02/28/19 195 lb 6.4 oz (88.6 kg)  02/02/19 193 lb (87.5 kg)  01/19/19 193 lb (87.5 kg)    Physical Exam Vitals signs and nursing note reviewed.  Constitutional:      General: She is awake. She is not in acute distress.    Appearance: She is well-developed. She is not ill-appearing.  HENT:     Head: Normocephalic.     Right Ear: Hearing normal.     Left Ear: Hearing normal.     Nose: Nose normal.     Mouth/Throat:     Mouth: Mucous membranes are moist.  Eyes:     General: Lids are normal.        Right eye: No discharge.        Left eye: No discharge.     Conjunctiva/sclera: Conjunctivae normal.     Pupils: Pupils are equal, round, and reactive to light.  Neck:     Musculoskeletal: Normal  range of motion and neck supple.     Thyroid: No thyromegaly.     Vascular: No carotid bruit or JVD.  Cardiovascular:     Rate and Rhythm: Normal rate and regular rhythm.     Heart sounds: Normal heart sounds. No murmur. No gallop.   Pulmonary:     Effort: Pulmonary effort is normal.     Breath sounds: Normal breath sounds.  Abdominal:     General: Bowel sounds are normal.     Palpations: Abdomen is soft. There is no hepatomegaly or splenomegaly.  Musculoskeletal:     Right lower leg: No edema.     Left lower leg: No edema.  Lymphadenopathy:     Cervical: No cervical adenopathy.  Skin:    General: Skin is warm and dry.  Neurological:     Mental Status: She is alert and oriented to person, place, and time.  Psychiatric:        Attention and Perception: Attention normal.        Mood and Affect: Mood normal.         Speech: Speech normal.        Behavior: Behavior normal. Behavior is cooperative.        Thought Content: Thought content normal.        Judgment: Judgment normal.     Results for orders placed or performed during the hospital encounter of 01/28/19  Novel Coronavirus, NAA (hospital order; send-out to ref lab)   Specimen: Nasopharyngeal Swab; Respiratory  Result Value Ref Range   SARS-CoV-2, NAA NOT DETECTED NOT DETECTED   Coronavirus Source NASOPHARYNGEAL       Assessment & Plan:   Problem List Items Addressed This Visit      Other   Hyperlipidemia - Primary    Chronic, LDL 117 and 101 with slight elevation in risk score (ASCVD) and smoker.  LDL 86 today and TCHOL 155.  Will continue statin. Does not want CCM referral to discuss medication assistance at this time.  Return in 6 months.      Relevant Medications   atorvastatin (LIPITOR) 10 MG tablet   Other Relevant Orders   Lipid Panel Piccolo, Waived   Comprehensive metabolic panel       Follow up plan: Return in about 6 months (around 08/31/2019) for HLD and mood.

## 2019-03-01 LAB — COMPREHENSIVE METABOLIC PANEL
ALT: 17 IU/L (ref 0–32)
AST: 14 IU/L (ref 0–40)
Albumin/Globulin Ratio: 1.4 (ref 1.2–2.2)
Albumin: 4.4 g/dL (ref 3.8–4.8)
Alkaline Phosphatase: 106 IU/L (ref 39–117)
BUN/Creatinine Ratio: 13 (ref 12–28)
BUN: 11 mg/dL (ref 8–27)
Bilirubin Total: 0.2 mg/dL (ref 0.0–1.2)
CO2: 23 mmol/L (ref 20–29)
Calcium: 9.5 mg/dL (ref 8.7–10.3)
Chloride: 103 mmol/L (ref 96–106)
Creatinine, Ser: 0.83 mg/dL (ref 0.57–1.00)
GFR calc Af Amer: 87 mL/min/{1.73_m2} (ref >59)
GFR calc non Af Amer: 76 mL/min/{1.73_m2} (ref >59)
Globulin, Total: 3.1 g/dL (ref 1.5–4.5)
Glucose: 104 mg/dL — ABNORMAL HIGH (ref 65–99)
Potassium: 4 mmol/L (ref 3.5–5.2)
Sodium: 141 mmol/L (ref 134–144)
Total Protein: 7.5 g/dL (ref 6.0–8.5)

## 2019-03-01 NOTE — Progress Notes (Signed)
Normal test results noted.  Please call patient and make them aware of normal results and will continue to monitor at regular visits.  Have a great day.  Look forward to seeing you at your next visit.

## 2019-05-16 ENCOUNTER — Other Ambulatory Visit: Payer: Self-pay

## 2019-05-16 MED ORDER — CHOLECALCIFEROL 1.25 MG (50000 UT) PO CAPS: 50000.0000 [IU] | ORAL_CAPSULE | ORAL | 0 refills | Status: DC

## 2019-05-25 ENCOUNTER — Other Ambulatory Visit: Payer: Self-pay

## 2019-05-25 DIAGNOSIS — Z20822 Contact with and (suspected) exposure to covid-19: Secondary | ICD-10-CM

## 2019-05-26 LAB — NOVEL CORONAVIRUS, NAA: SARS-CoV-2, NAA: NOT DETECTED

## 2019-06-08 ENCOUNTER — Ambulatory Visit: Payer: Self-pay | Admitting: *Deleted

## 2019-06-08 NOTE — Telephone Encounter (Addendum)
   Reason for Disposition . [1] MODERATE pain (e.g., interferes with normal activities, limping) AND [2] present > 3 days  Answer Assessment - Initial Assessment Questions 1. LOCATION: "Which joint is swollen?"     Right worse than left - ankle and foot  2. ONSET: "When did the swelling start?"     2 weeks ago 3. SIZE: "How large is the swelling?"      4. PAIN: "Is there any pain?" If so, ask: "How bad is it?" (Scale 1-10; or mild, moderate, severe)     Moderate swelling 5. CAUSE: "What do you think caused the swollen joint?"     Unsure  6. OTHER SYMPTOMS: "Do you have any other symptoms?" (e.g., fever, chest pain, difficulty breathing, calf pain)   Right ankle pain 7. PREGNANCY: "Is there any chance you are pregnant?" "When was your last menstrual period?"     N/A  Protocols used: ANKLE SWELLING-A-AH  Patient states she began having bilateral ankle and foot swelling and pain 2 weeks ago.  She denies any injury, shortness of breath, chest pain.  She has tried elevating feet - this does not improve swelling.  She is wondering if pain and swelling could be related to her medications.  She describes pain and and swelling as moderate.  Advised patient she should be evaluated by someone at PCP office- patient states she cannot go for appointment today.  She states she will schedule appointment for tomorrow.  Call transferred to [facility] at [facility]  Advised patient if she developed any chest pain or shortness of breath before her appointment she should proceed immediately to ER.   Patient agreeable.

## 2019-06-10 ENCOUNTER — Encounter: Payer: Self-pay | Admitting: Family Medicine

## 2019-06-10 ENCOUNTER — Other Ambulatory Visit: Payer: Self-pay

## 2019-06-10 ENCOUNTER — Ambulatory Visit: Payer: Managed Care, Other (non HMO) | Admitting: Family Medicine

## 2019-06-10 VITALS — BP 118/85 | HR 80 | Temp 98.2°F | Ht 64.5 in | Wt 204.0 lb

## 2019-06-10 DIAGNOSIS — M25471 Effusion, right ankle: Secondary | ICD-10-CM | POA: Diagnosis not present

## 2019-06-10 DIAGNOSIS — Z23 Encounter for immunization: Secondary | ICD-10-CM

## 2019-06-10 NOTE — Progress Notes (Signed)
BP 118/85   Pulse 80   Temp 98.2 F (36.8 C) (Oral)   Ht 5' 4.5" (1.638 m)   Wt 204 lb (92.5 kg)   SpO2 97%   BMI 34.48 kg/m    Subjective:    Patient ID: Heather Hill, female    DOB: [birth year], 63 y.o.   MRN: [id number]  HPI: [name] is a 63 y.o. female  Chief Complaint  Patient presents with  . Foot Swelling    right ankle x about 2 weeks ago   2 weeks of right ankle swelling, now improved significantly and just barely swollen. Not painful or red other than some soreness at top of foot, no injury, no new medications, recent sedentary behaviors, long travel, recent surgeries, CP, SOB. Not trying anything at home for relief. No hx of leg swelling in the past that she can recall.   Relevant past medical, surgical, family and social history reviewed and updated as indicated. Interim medical history since our last visit reviewed. Allergies and medications reviewed and updated.  Review of Systems  Per HPI unless specifically indicated above     Objective:    BP 118/85   Pulse 80   Temp 98.2 F (36.8 C) (Oral)   Ht 5' 4.5" (1.638 m)   Wt 204 lb (92.5 kg)   SpO2 97%   BMI 34.48 kg/m   Wt Readings from Last 3 Encounters:  06/10/19 204 lb (92.5 kg)  02/28/19 195 lb 6.4 oz (88.6 kg)  02/02/19 193 lb (87.5 kg)    Physical Exam Vitals signs and nursing note reviewed.  Constitutional:      Appearance: Normal appearance. She is not ill-appearing.  HENT:     Head: Atraumatic.  Eyes:     Extraocular Movements: Extraocular movements intact.     Conjunctiva/sclera: Conjunctivae normal.  Neck:     Musculoskeletal: Normal range of motion and neck supple.  Cardiovascular:     Rate and Rhythm: Normal rate and regular rhythm.     Heart sounds: Normal heart sounds.  Pulmonary:     Effort: Pulmonary effort is normal.     Breath sounds: Normal breath sounds.  Musculoskeletal: Normal range of motion.        General: Swelling (minimal noticeable edema at right  dorsal foot into right ankle, with mild ttp in this area) present.     Comments: Negative homan's sign and squeeze test, no calf ttp going up into leg, no redness, heat, masses  Skin:    General: Skin is warm and dry.     Findings: No erythema.  Neurological:     Mental Status: She is alert and oriented to person, place, and time.  Psychiatric:        Mood and Affect: Mood normal.        Thought Content: Thought content normal.        Judgment: Judgment normal.     Results for orders placed or performed in visit on 05/25/19  Novel Coronavirus, NAA ([facility])   Specimen: Oropharyngeal(OP) collection in vial transport medium   OROPHARYNGEA  TESTING  Result Value Ref Range   SARS-CoV-2, NAA Not Detected Not Detected      Assessment & Plan:   Problem List Items Addressed This Visit    None    Visit Diagnoses    Right ankle swelling    -  Primary   Low suspicion for DVT given exam and hx, suspect ankle strain given localized ttp  and trace edema. Tx with NSAIDs, compression stockings, RICE. F/u if no better   Flu vaccine need       Relevant Orders   Flu Vaccine QUAD 36+ mos IM (Completed)       Follow up plan: Return if symptoms worsen or fail to improve.

## 2019-06-23 ENCOUNTER — Encounter: Payer: Self-pay | Admitting: Podiatry

## 2019-06-23 ENCOUNTER — Other Ambulatory Visit: Payer: Self-pay

## 2019-06-23 ENCOUNTER — Ambulatory Visit (INDEPENDENT_AMBULATORY_CARE_PROVIDER_SITE_OTHER): Payer: Managed Care, Other (non HMO) | Admitting: Podiatry

## 2019-06-23 DIAGNOSIS — B351 Tinea unguium: Secondary | ICD-10-CM

## 2019-06-23 DIAGNOSIS — M79674 Pain in right toe(s): Secondary | ICD-10-CM

## 2019-06-23 DIAGNOSIS — R600 Localized edema: Secondary | ICD-10-CM

## 2019-06-23 DIAGNOSIS — M79671 Pain in right foot: Secondary | ICD-10-CM

## 2019-06-23 DIAGNOSIS — M79672 Pain in left foot: Secondary | ICD-10-CM | POA: Diagnosis not present

## 2019-06-23 DIAGNOSIS — M79675 Pain in left toe(s): Secondary | ICD-10-CM | POA: Diagnosis not present

## 2019-06-23 NOTE — Progress Notes (Signed)
  Subjective:  Patient ID: Heather Hill, female    DOB: 03-Mar-1956,  MRN: [id number]  Chief Complaint  Patient presents with  . Nail Problem    Patient presents today for nail trim and swelling bilat feet off and on x months   She denies any pain, only states "they feel tight"   63 y.o. female returns for the above complaint.  Patient states her elongated painful toenails dystrophic and mycotic in nature has been troublesome when she wears her shoe gears.  She states that her pain is out of proportion with some shoes.  She also states that she has this severe edema to bilateral lower extremity that has started 1 month in duration and she does not know how to get rid of it.  She thinks it might be due to her medication.  She has not tried anything over-the-counter.  She is ambulating in regular sneakers today.  Objective:  There were no vitals filed for this visit. Podiatric Exam: Vascular: dorsalis pedis and posterior tibial pulses are palpable bilateral. Capillary return is immediate. Temperature gradient is WNL. Skin turgor WNL  Sensorium: Normal Semmes Weinstein monofilament test. Normal tactile sensation bilaterally. Nail Exam: Pt has thick disfigured discolored nails with subungual debris noted bilateral entire nail hallux through fifth toenails Ulcer Exam: There is no evidence of ulcer or pre-ulcerative changes or infection. Orthopedic Exam: Muscle tone and strength are WNL. No limitations in general ROM. No crepitus or effusions noted. HAV  B/L.  Hammer toes 2-5  B/L. Skin: No Porokeratosis. No infection or ulcers.  Severe 3+ pitting edema noted to bilateral lower extremity.  Assessment & Plan:  Patient was evaluated and treated and all questions answered.  Onychomycosis with pain  -Nails palliatively debrided as below. -Educated on self-care  Severe edema 3+ pitting bilateral lower extremity -Educated her on the etiology and causes of edema to the lower extremity.  I  instructed her that elevation with compression is the best therapy to reduce swelling.  This in turn/indirectly will help reduce pain and tightness that she is feeling throughout her lower extremity. -She states that she has compression socks at home and she will be elevating aggressively.  Procedure: Nail Debridement Rationale: pain  Type of Debridement: manual, sharp debridement. Instrumentation: Nail nipper, rotary burr. Number of Nails: 10  Procedures and Treatment: Consent by patient was obtained for treatment procedures. The patient understood the discussion of treatment and procedures well. All questions were answered thoroughly reviewed. Debridement of mycotic and hypertrophic toenails, 1 through 5 bilateral and clearing of subungual debris. No ulceration, no infection noted.  Return Visit-Office Procedure: Patient instructed to return to the office for a follow up visit 3 months for continued evaluation and treatment.  Boneta Lucks, DPM    No follow-ups on file.

## 2019-07-27 ENCOUNTER — Ambulatory Visit: Payer: Managed Care, Other (non HMO) | Admitting: Nurse Practitioner

## 2019-08-05 ENCOUNTER — Other Ambulatory Visit: Payer: Self-pay

## 2019-08-05 ENCOUNTER — Encounter: Payer: Self-pay | Admitting: Nurse Practitioner

## 2019-08-05 ENCOUNTER — Ambulatory Visit: Payer: Managed Care, Other (non HMO) | Admitting: Nurse Practitioner

## 2019-08-05 ENCOUNTER — Ambulatory Visit (INDEPENDENT_AMBULATORY_CARE_PROVIDER_SITE_OTHER): Payer: Managed Care, Other (non HMO) | Admitting: Nurse Practitioner

## 2019-08-05 DIAGNOSIS — F331 Major depressive disorder, recurrent, moderate: Secondary | ICD-10-CM | POA: Diagnosis not present

## 2019-08-05 DIAGNOSIS — E782 Mixed hyperlipidemia: Secondary | ICD-10-CM | POA: Diagnosis not present

## 2019-08-05 DIAGNOSIS — F17210 Nicotine dependence, cigarettes, uncomplicated: Secondary | ICD-10-CM | POA: Diagnosis not present

## 2019-08-05 NOTE — Progress Notes (Signed)
There were no vitals taken for this visit.   Subjective:    Patient ID: Heather Hill, female    DOB: October 20, 1955, 63 y.o.   MRN: [id number]  HPI: Heather Hill is a 63 y.o. female  Chief Complaint  Patient presents with  . Anxiety  . Depression  . Hyperlipidemia    . This visit was completed via FaceTime due to the restrictions of the COVID-19 pandemic. All issues as above were discussed and addressed. Physical exam was done as above through visual confirmation on Facetime. If it was felt that the patient should be evaluated in the office, they were directed there. The patient verbally consented to this visit. . Location of the patient: home . Location of the provider: work . Those involved with this call:  . Provider: Marnee Guarneri, DNP . CMA: Yvonna Alanis, CMA . Front Desk/Registration: Jill Side  . Time spent on call: 15 minutes with patient face to face via video conference. More than 50% of this time was spent in counseling and coordination of care. 10 minutes total spent in review of patient's record and preparation of their chart.  . I verified patient identity using two factors (patient name and date of birth). Patient consents verbally to being seen via telemedicine visit today.    DEPRESSION/ANXIETY Currently on Abilify, Ativan, and Prozac which is managed by [facility], sees them next on 23rd.  Has sister with Bipolar. 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  She does not currently do talk therapy and discussed this element may be beneficial for her due to her history, as it could provide ways to cope and techniques to help with anxieties while driving.  She is interested in therapy.  Therapy is available at [facility].   Mood status: stable Satisfied with current treatment?: no Symptom severity: mild  Duration of current treatment : chronic Side effects: no Medication compliance: good  compliance Psychotherapy/counseling: not current Previous psychiatric medications: multiple she does not recall Depressed mood: just a little Anxious mood: only when driving Anhedonia: no Significant weight loss or gain: no Insomnia: yes hard to fall asleep , continues to take Ativan as needed Fatigue: no Feelings of worthlessness or guilt: no Impaired concentration/indecisiveness: no Suicidal ideations: no Hopelessness: yes Crying spells: no Depression screen [facility] 2/9 08/05/2019 01/19/2019 11/16/2018  Decreased Interest 2 3 2   Down, Depressed, Hopeless 3 3 2   PHQ - 2 Score 5 6 4   Altered sleeping 3 3 3   Tired, decreased energy 3 3 3   Change in appetite 3 3 3   Feeling bad or failure about yourself  1 2 3   Trouble concentrating 0 2 2  Moving slowly or fidgety/restless 0 0 0  Suicidal thoughts 0 0 0  PHQ-9 Score 15 19 18   Difficult doing work/chores Somewhat difficult Somewhat difficult -   HYPERLIPIDEMIA Was started on Atorvastatin 10 MG daily in May due to elevation in LDL and cardiac risk.  Has tolerated well.  Last LDL improved at 86.  Continues to smoke 1/2 to 1 PPD and not interested in quitting.   Hyperlipidemia status: good compliance Satisfied with current treatment?  yes Side effects:  no Medication compliance: good compliance Past cholesterol meds: atorvastain (lipitor) Supplements: none Aspirin:  no The 10-year ASCVD risk score Mikey Bussing DC Jr., et al., 2013) is: 8.5%   Values used to calculate the score:     Age: 69 years  Sex: Female     Is Non-Hispanic African American: Yes     Diabetic: No     Tobacco smoker: Yes     Systolic Blood Pressure: 123456 mmHg     Is BP treated: No     HDL Cholesterol: 54 mg/dL     Total Cholesterol: 155 mg/dL Chest pain:  no Coronary artery disease:  no Family history CAD:  yes Family history early CAD:  no  Relevant past medical, surgical, family and social history reviewed and updated as indicated. Interim medical history since  our last visit reviewed. Allergies and medications reviewed and updated.  Review of Systems  Constitutional: Negative for activity change, appetite change, diaphoresis, fatigue and fever.  Respiratory: Negative.   Cardiovascular: Negative.   Psychiatric/Behavioral: Positive for sleep disturbance. Negative for decreased concentration, self-injury and suicidal ideas. The patient is nervous/anxious.     Per HPI unless specifically indicated above     Objective:    There were no vitals taken for this visit.  Wt Readings from Last 3 Encounters:  06/10/19 204 lb (92.5 kg)  02/28/19 195 lb 6.4 oz (88.6 kg)  02/02/19 193 lb (87.5 kg)    Physical Exam Vitals and nursing note reviewed.  Constitutional:      General: She is awake. She is not in acute distress.    Appearance: She is well-developed. She is not ill-appearing.  HENT:     Head: Normocephalic.     Right Ear: Hearing normal.     Left Ear: Hearing normal.  Eyes:     General: Lids are normal.        Right eye: No discharge.        Left eye: No discharge.     Conjunctiva/sclera: Conjunctivae normal.  Pulmonary:     Effort: Pulmonary effort is normal. No accessory muscle usage or respiratory distress.  Musculoskeletal:     Cervical back: Normal range of motion.  Neurological:     Mental Status: She is alert and oriented to person, place, and time.  Psychiatric:        Attention and Perception: Attention normal.        Mood and Affect: Mood normal.        Behavior: Behavior normal. Behavior is cooperative.        Thought Content: Thought content normal.        Judgment: Judgment normal.     Results for orders placed or performed in visit on 05/25/19  Novel Coronavirus, NAA ([facility])   Specimen: Oropharyngeal(OP) collection in vial transport medium   OROPHARYNGEA  TESTING  Result Value Ref Range   SARS-CoV-2, NAA Not Detected Not Detected      Assessment & Plan:   Problem List Items Addressed This Visit       Other   Moderate episode of recurrent major depressive disorder ([place]) - Primary    Chronic, ongoing, suspect some PTSD due to past traumatic experience with loss of son & possible Bipolar due to family history.  Denies SI/ HI.  Continue current medication regimen and collaboration with psychiatry. She is aware all psychiatry medications are to remain to be obtained from her psychiatrist.  Return in 3 months.      Nicotine dependence, cigarettes, uncomplicated    I have recommended complete cessation of tobacco use. I have discussed various options available for assistance with tobacco cessation including over the counter methods (Nicotine gum, patch and lozenges). We also discussed prescription options (Chantix, Nicotine Inhaler / Nasal  Spray). The patient is not interested in pursuing any prescription tobacco cessation options at this time.       Hyperlipidemia    Chronic, ongoing with risk factors.  Continue statin. Return in 3 months for in office visit and labs.         I discussed the assessment and treatment plan with the patient. The patient was provided an opportunity to ask questions and all were answered. The patient agreed with the plan and demonstrated an understanding of the instructions.   The patient was advised to call back or seek an in-person evaluation if the symptoms worsen or if the condition fails to improve as anticipated.   I provided 15 minutes of time during this encounter.  Follow up plan: Return in about 3 months (around 11/03/2019) for For Mood and HLD + labs in office.

## 2019-08-05 NOTE — Assessment & Plan Note (Signed)
Chronic, ongoing, suspect some PTSD due to past traumatic experience with loss of son & possible Bipolar due to family history.  Denies SI/ HI.  Continue current medication regimen and collaboration with psychiatry. She is aware all psychiatry medications are to remain to be obtained from her psychiatrist.  Return in 3 months.

## 2019-08-05 NOTE — Assessment & Plan Note (Signed)
I have recommended complete cessation of tobacco use. I have discussed various options available for assistance with tobacco cessation including over the counter methods (Nicotine gum, patch and lozenges). We also discussed prescription options (Chantix, Nicotine Inhaler / Nasal Spray). The patient is not interested in pursuing any prescription tobacco cessation options at this time.  

## 2019-08-05 NOTE — Patient Instructions (Signed)
Bipolar 1 Disorder Bipolar 1 disorder is a mental health disorder in which a person has episodes of emotional highs (mania), and may also have episodes of emotional lows (depression) in addition to highs. Bipolar 1 disorder is different from other bipolar disorders because it involves extreme manic episodes. These episodes last at least one week or involve symptoms that are so severe that hospitalization is needed to keep the person safe. What increases the risk? The cause of this condition is not known. However, certain factors make you more likely to have bipolar disorder, such as:  Having a family member with the disorder.  An imbalance of certain chemicals in the brain (neurotransmitters).  Stress, such as illness, financial problems, or a death.  Certain conditions that affect the brain or spinal cord (neurologic conditions).  Brain injury (trauma).  Having another mental health disorder, such as: ? Obsessive compulsive disorder. ? Schizophrenia. What are the signs or symptoms? Symptoms of mania include:  Very high self-esteem or self-confidence.  Decreased need for sleep.  Unusual talkativeness or feeling a need to keep talking. Speech may be very fast. It may seem like you cannot stop talking.  Racing thoughts or constant talking, with quick shifts between topics that may or may not be related (flight of ideas).  Decreased ability to focus or concentrate.  Increased purposeful activity, such as work, studies, or social activity.  Increased nonproductive activity. This could be pacing, squirming and fidgeting, or finger and toe tapping.  Impulsive behavior and poor judgment. This may result in high-risk activities, such as having unprotected sex or spending a lot of money. Symptoms of depression include:  Feeling sad, hopeless, or helpless.  Frequent or uncontrollable crying.  Lack of feeling or caring about anything.  Sleeping too much.  Moving more slowly than  usual.  Not being able to enjoy things you used to enjoy.  Wanting to be alone all the time.  Feeling guilty or worthless.  Lack of energy or motivation.  Trouble concentrating or remembering.  Trouble making decisions.  Increased appetite.  Thoughts of death, or the desire to harm yourself. Sometimes, you may have a mixed mood. This means having symptoms of depression and mania. Stress can make symptoms worse. How is this diagnosed? To diagnose bipolar disorder, your health care provider may ask about your:  Emotional episodes.  Medical history.  Alcohol and drug use. This includes prescription medicines. Certain medical conditions and substances can cause symptoms that seem like bipolar disorder (secondary bipolar disorder). How is this treated? Bipolar disorder is a long-term (chronic) illness. It is best controlled with ongoing (continuous) treatment rather than treatment only when symptoms occur. Treatment may include:  Medicine. Medicine can be prescribed by a provider who specializes in treating mental disorders (psychiatrist). ? Medicines called mood stabilizers are usually prescribed. ? If symptoms occur even while taking a mood stabilizer, other medicines may be added.  Psychotherapy. Some forms of talk therapy, such as cognitive-behavioral therapy (CBT), can provide support, education, and guidance.  Coping methods, such as journaling or relaxation exercises. These may include: ? Yoga. ? Meditation. ? Deep breathing.  Lifestyle changes, such as: ? Limiting alcohol and drug use. ? Exercising regularly. ? Getting plenty of sleep. ? Making healthy eating choices.  A combination of medicine, talk therapy, and coping methods is best. A procedure in which electricity is applied to the brain through the scalp (electroconvulsive therapy) may be used in cases of severe mania when medicine and psychotherapy work too  slowly or do not work. Follow these instructions at  home: Activity   Return to your normal activities as told by your health care provider.  Find activities that you enjoy, and make time to do them.  Exercise regularly as told by your health care provider. Lifestyle  Limit alcohol intake to no more than 1 drink a day for nonpregnant women and 2 drinks a day for men. One drink equals 12 oz of beer, 5 oz of wine, or 1 oz of hard liquor.  Follow a set schedule for eating and sleeping.  Eat a balanced diet that includes fresh fruits and vegetables, whole grains, low-fat dairy, and lean meat.  Get 7-8 hours of sleep each night. General instructions  Take over-the-counter and prescription medicines only as told by your health care provider.  Think about joining a support group. Your health care provider may be able to recommend a support group.  Talk with your family and loved ones about your treatment goals and how they can help.  Keep all follow-up visits as told by your health care provider. This is important. Where to find more information For more information about bipolar disorder, visit the following websites:  [employer] on Mental Illness: www.nami.[place]: https://carter.com/ Contact a health care provider if:  Your symptoms get worse.  You have side effects from your medicine, and they get worse.  You have trouble sleeping.  You have trouble doing daily activities.  You feel unsafe in your surroundings.  You are dealing with substance abuse. Get help right away if:  You have new symptoms.  You have thoughts about harming yourself.  You self-harm. This information is not intended to replace advice given to you by your health care provider. Make sure you discuss any questions you have with your health care provider. Document Released: 11/17/2000 Document Revised: 07/24/2017 Document Reviewed: 04/10/2016 Elsevier Patient Education  2020 [employer].

## 2019-08-05 NOTE — Progress Notes (Signed)
LVM to make 3 month apt.Sent letter.

## 2019-08-05 NOTE — Assessment & Plan Note (Signed)
Chronic, ongoing with risk factors.  Continue statin. Return in 3 months for in office visit and labs.

## 2019-09-22 ENCOUNTER — Encounter: Payer: Self-pay | Admitting: Podiatry

## 2019-09-22 ENCOUNTER — Other Ambulatory Visit: Payer: Self-pay

## 2019-09-22 ENCOUNTER — Ambulatory Visit: Payer: Managed Care, Other (non HMO) | Admitting: Podiatry

## 2019-09-22 DIAGNOSIS — M79675 Pain in left toe(s): Secondary | ICD-10-CM | POA: Diagnosis not present

## 2019-09-22 DIAGNOSIS — M79674 Pain in right toe(s): Secondary | ICD-10-CM

## 2019-09-22 DIAGNOSIS — B351 Tinea unguium: Secondary | ICD-10-CM

## 2019-09-26 ENCOUNTER — Encounter: Payer: Self-pay | Admitting: Podiatry

## 2019-09-26 NOTE — Progress Notes (Signed)
  Subjective:  Patient ID: Heather Hill, female    DOB: [birth year],  MRN: [id number]  Chief Complaint  Patient presents with  . Nail Problem    nail trim RFC   64 y.o. female returns for the above complaint.  Patient presents with a routine foot care of debriding the toenails x10.  Patient states that they have been troublesome for a while now.  She states that the pain is out of proportion with some shoes.  She denies any other acute complaints.  She has not tried anything over-the-counter.  She denies nausea fever chills vomiting.  Objective:  There were no vitals filed for this visit. Podiatric Exam: Vascular: dorsalis pedis and posterior tibial pulses are palpable bilateral. Capillary return is immediate. Temperature gradient is WNL. Skin turgor WNL  Sensorium: Normal Semmes Weinstein monofilament test. Normal tactile sensation bilaterally. Nail Exam: Pt has thick disfigured discolored nails with subungual debris noted bilateral entire nail hallux through fifth toenails Ulcer Exam: There is no evidence of ulcer or pre-ulcerative changes or infection. Orthopedic Exam: Muscle tone and strength are WNL. No limitations in general ROM. No crepitus or effusions noted. HAV  B/L.  Hammer toes 2-5  B/L. Skin: No Porokeratosis. No infection or ulcers  Assessment & Plan:  Patient was evaluated and treated and all questions answered.  Onychomycosis with pain  -Nails palliatively debrided as below. -Educated on self-care  Procedure: Nail Debridement Rationale: pain  Type of Debridement: manual, sharp debridement. Instrumentation: Nail nipper, rotary burr. Number of Nails: 10  Procedures and Treatment: Consent by patient was obtained for treatment procedures. The patient understood the discussion of treatment and procedures well. All questions were answered thoroughly reviewed. Debridement of mycotic and hypertrophic toenails, 1 through 5 bilateral and clearing of subungual debris. No  ulceration, no infection noted.  Return Visit-Office Procedure: Patient instructed to return to the office for a follow up visit 3 months for continued evaluation and treatment.  Boneta Lucks, DPM    No follow-ups on file.

## 2019-11-09 ENCOUNTER — Encounter: Payer: Self-pay | Admitting: Nurse Practitioner

## 2019-11-09 ENCOUNTER — Ambulatory Visit (INDEPENDENT_AMBULATORY_CARE_PROVIDER_SITE_OTHER): Payer: Managed Care, Other (non HMO) | Admitting: Nurse Practitioner

## 2019-11-09 ENCOUNTER — Other Ambulatory Visit: Payer: Self-pay

## 2019-11-09 VITALS — BP 110/75 | HR 96 | Temp 98.4°F

## 2019-11-09 DIAGNOSIS — E6609 Other obesity due to excess calories: Secondary | ICD-10-CM | POA: Diagnosis not present

## 2019-11-09 DIAGNOSIS — F331 Major depressive disorder, recurrent, moderate: Secondary | ICD-10-CM | POA: Diagnosis not present

## 2019-11-09 DIAGNOSIS — Z6832 Body mass index (BMI) 32.0-32.9, adult: Secondary | ICD-10-CM

## 2019-11-09 DIAGNOSIS — F17210 Nicotine dependence, cigarettes, uncomplicated: Secondary | ICD-10-CM | POA: Diagnosis not present

## 2019-11-09 DIAGNOSIS — E782 Mixed hyperlipidemia: Secondary | ICD-10-CM | POA: Diagnosis not present

## 2019-11-09 NOTE — Assessment & Plan Note (Addendum)
I have recommended complete cessation of tobacco use. I have discussed various options available for assistance with tobacco cessation including over the counter methods (Nicotine gum, patch and lozenges). We also discussed prescription options (Chantix, Nicotine Inhaler / Nasal Spray). The patient is not interested in pursuing any prescription tobacco cessation options at this time.  Referral for lung CA CT screening ordered and discussed with patient at length.

## 2019-11-09 NOTE — Assessment & Plan Note (Addendum)
Chronic, ongoing, suspect some PTSD due to past traumatic experience with loss of son & possible Bipolar due to family history.  Denies SI/ HI.  Continue current medication regimen and collaboration with psychiatry. She is aware all psychiatry medications are to remain to be obtained from her psychiatrist.  Return in 6 months. 

## 2019-11-09 NOTE — Assessment & Plan Note (Signed)
Chronic, ongoing with risk factors.  Continue statin and adjust dose as needed. Lipid panel today.  Return in 6 months for physical.

## 2019-11-09 NOTE — Progress Notes (Signed)
BP 110/75   Pulse 96   Temp 98.4 F (36.9 C) (Oral)   SpO2 96%    Subjective:    Patient ID: Heather Hill, female    DOB: Jan 13, 1956, 64 y.o.   MRN: [id number]  HPI: Heather Hill is a 64 y.o. female  Chief Complaint  Patient presents with  . Depression  . Hyperlipidemia  . Paperwork   DEPRESSION/ANXIETY Currently on Abilify, Ativan, and Prozac which is managed by [facility], last saw in December.  Sees them again this Friday.  Has sister with Bipolar. 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 She does not currently do talk therapy and discussed this element may be beneficial for her due to her history, as it could provide ways to cope and techniques to help with anxieties while driving. She brought in long term disability paperwork today, advised her to speak to her disability lawyer and social security office, will need to go to a specific disability provider to have this paperwork filled out.  She agrees to this and reports understanding. Mood status:stable Satisfied with current treatment?:no Symptom severity:mild Duration of current treatment :chronic Side effects:no Medication compliance:good compliance Psychotherapy/counseling:not current Previous psychiatric medications:multiple she does not recall Depressed mood:just a little Anxious mood:only when driving Anhedonia:no Significant weight loss or gain:no Insomnia:yeshard to fall asleep, continues to take Ativan as needed Fatigue:no Feelings of worthlessness or guilt:no Impaired concentration/indecisiveness:no Suicidal ideations:no Hopelessness:yes Crying spells:no Depression screen [facility] 2/9 11/09/2019 08/05/2019 01/19/2019 11/16/2018  Decreased Interest 3 2 3 2   Down, Depressed, Hopeless 1 3 3 2   PHQ - 2 Score 4 5 6 4   Altered sleeping 3 3 3 3   Tired, decreased energy 3 3 3 3   Change in appetite 3 3 3 3   Feeling bad or  failure about yourself  3 1 2 3   Trouble concentrating 2 0 2 2  Moving slowly or fidgety/restless 2 0 0 0  Suicidal thoughts 0 0 0 0  PHQ-9 Score 20 15 19 18   Difficult doing work/chores Somewhat difficult Somewhat difficult Somewhat difficult -   HYPERLIPIDEMIA Continues on Atorvastatin 10 MG daily, last cholesterol levels with improvement with medication.  Continues to smoke 1/2 to 1 PPD and not interested in quitting.  Started smoking when in 10th grade.  Educated on lung CT screening and is interested in obtaining. Hyperlipidemia status:good compliance Satisfied with current treatment?yes Side effects:no Medication compliance:good compliance Past cholesterol meds:atorvastain (lipitor) Supplements:none Aspirin:no The 10-year ASCVD risk score Mikey Bussing DC Jr., et al., 2013) is: 7.1%   Values used to calculate the score:     Age: 89 years     Sex: Female     Is Non-Hispanic African American: Yes     Diabetic: No     Tobacco smoker: Yes     Systolic Blood Pressure: [id number] mmHg     Is BP treated: No     HDL Cholesterol: 54 mg/dL     Total Cholesterol: 155 mg/dL   Relevant past medical, surgical, family and social history reviewed and updated as indicated. Interim medical history since our last visit reviewed. Allergies and medications reviewed and updated.  Review of Systems  Constitutional: Negative for activity change, appetite change, diaphoresis, fatigue and fever.  Respiratory: Negative.   Cardiovascular: Negative.   Psychiatric/Behavioral: Positive for sleep disturbance. Negative for decreased concentration, self-injury and suicidal ideas. The patient is nervous/anxious.     Per HPI unless specifically indicated  above     Objective:    BP 110/75   Pulse 96   Temp 98.4 F (36.9 C) (Oral)   SpO2 96%   Wt Readings from Last 3 Encounters:  06/10/19 204 lb (92.5 kg)  02/28/19 195 lb 6.4 oz (88.6 kg)  02/02/19 193 lb (87.5 kg)    Physical Exam Vitals and  nursing note reviewed.  Constitutional:      General: She is awake. She is not in acute distress.    Appearance: She is well-developed. She is not ill-appearing.  HENT:     Head: Normocephalic.     Right Ear: Hearing normal.     Left Ear: Hearing normal.  Eyes:     General: Lids are normal.        Right eye: No discharge.        Left eye: No discharge.     Conjunctiva/sclera: Conjunctivae normal.  Neck:     Thyroid: No thyromegaly.     Vascular: No carotid bruit.  Cardiovascular:     Rate and Rhythm: Normal rate.     Heart sounds: Normal heart sounds. No systolic murmur. No diastolic murmur.  Pulmonary:     Effort: Pulmonary effort is normal. No accessory muscle usage or respiratory distress.  Abdominal:     General: Bowel sounds are normal.     Palpations: Abdomen is soft.     Tenderness: There is no abdominal tenderness.  Musculoskeletal:        General: Normal range of motion.     Cervical back: Normal range of motion.     Right lower leg: No edema.     Left lower leg: No edema.  Neurological:     Mental Status: She is alert and oriented to person, place, and time.  Psychiatric:        Attention and Perception: Attention normal.        Mood and Affect: Mood normal.        Speech: Speech normal.        Behavior: Behavior normal. Behavior is cooperative.     Results for orders placed or performed in visit on 05/25/19  Novel Coronavirus, NAA ([facility])   Specimen: Oropharyngeal(OP) collection in vial transport medium   OROPHARYNGEA  TESTING  Result Value Ref Range   SARS-CoV-2, NAA Not Detected Not Detected      Assessment & Plan:   Problem List Items Addressed This Visit      Other   Moderate episode of recurrent major depressive disorder ([place]) - Primary    Chronic, ongoing, suspect some PTSD due to past traumatic experience with loss of son & possible Bipolar due to family history.  Denies SI/ HI.  Continue current medication regimen and collaboration with  psychiatry. She is aware all psychiatry medications are to remain to be obtained from her psychiatrist.  Return in 6 months.      Obesity    Recommend continued focus on healthy diet choices and regular physical activity (30 minutes 5 days a week).       Nicotine dependence, cigarettes, uncomplicated    I have recommended complete cessation of tobacco use. I have discussed various options available for assistance with tobacco cessation including over the counter methods (Nicotine gum, patch and lozenges). We also discussed prescription options (Chantix, Nicotine Inhaler / Nasal Spray). The patient is not interested in pursuing any prescription tobacco cessation options at this time.  Referral for lung CA CT screening ordered and discussed with patient  at length.       Relevant Orders   Ambulatory Referral for Lung Cancer Scre   Hyperlipidemia    Chronic, ongoing with risk factors.  Continue statin and adjust dose as needed. Lipid panel today.  Return in 6 months for physical.      Relevant Orders   Lipid Panel w/o Chol/HDL Ratio   Comprehensive metabolic panel       Follow up plan: Return in about 6 months (around 05/11/2020) for Annual physical.

## 2019-11-09 NOTE — Patient Instructions (Signed)
Fat and Cholesterol Restricted Eating Plan Getting too much fat and cholesterol in your diet may cause health problems. Choosing the right foods helps keep your fat and cholesterol at normal levels. This can keep you from getting certain diseases. Your doctor may recommend an eating plan that includes:  Total fat: ______% or less of total calories a day.  Saturated fat: ______% or less of total calories a day.  Cholesterol: less than _________mg a day.  Fiber: ______g a day. What are tips for following this plan? Meal planning  At meals, divide your plate into four equal parts: ? Fill one-half of your plate with vegetables and green salads. ? Fill one-fourth of your plate with whole grains. ? Fill one-fourth of your plate with low-fat (lean) protein foods.  Eat fish that is high in omega-3 fats at least two times a week. This includes mackerel, tuna, sardines, and salmon.  Eat foods that are high in fiber, such as whole grains, beans, apples, broccoli, carrots, peas, and barley. General tips   Work with your doctor to lose weight if you need to.  Avoid: ? Foods with added sugar. ? Fried foods. ? Foods with partially hydrogenated oils.  Limit alcohol intake to no more than 1 drink a day for nonpregnant women and 2 drinks a day for men. One drink equals 12 oz of beer, 5 oz of wine, or 1 oz of hard liquor. Reading food labels  Check food labels for: ? Trans fats. ? Partially hydrogenated oils. ? Saturated fat (g) in each serving. ? Cholesterol (mg) in each serving. ? Fiber (g) in each serving.  Choose foods with healthy fats, such as: ? Monounsaturated fats. ? Polyunsaturated fats. ? Omega-3 fats.  Choose grain products that have whole grains. Look for the word "whole" as the first word in the ingredient list. Cooking  Cook foods using low-fat methods. These include baking, boiling, grilling, and broiling.  Eat more home-cooked foods. Eat at restaurants and buffets  less often.  Avoid cooking using saturated fats, such as butter, cream, palm oil, palm kernel oil, and coconut oil. Recommended foods  Fruits  All fresh, canned (in natural juice), or frozen fruits. Vegetables  Fresh or frozen vegetables (raw, steamed, roasted, or grilled). Green salads. Grains  Whole grains, such as whole wheat or whole grain breads, crackers, cereals, and pasta. Unsweetened oatmeal, bulgur, barley, quinoa, or brown rice. Corn or whole wheat flour tortillas. Meats and other protein foods  Ground beef (85% or leaner), grass-fed beef, or beef trimmed of fat. Skinless chicken or turkey. Ground chicken or turkey. Pork trimmed of fat. All fish and seafood. Egg whites. Dried beans, peas, or lentils. Unsalted nuts or seeds. Unsalted canned beans. Nut butters without added sugar or oil. Dairy  Low-fat or nonfat dairy products, such as skim or 1% milk, 2% or reduced-fat cheeses, low-fat and fat-free ricotta or cottage cheese, or plain low-fat and nonfat yogurt. Fats and oils  Tub margarine without trans fats. Light or reduced-fat mayonnaise and salad dressings. Avocado. Olive, canola, sesame, or safflower oils. The items listed above may not be a complete list of foods and beverages you can eat. Contact a dietitian for more information. Foods to avoid Fruits  Canned fruit in heavy syrup. Fruit in cream or butter sauce. Fried fruit. Vegetables  Vegetables cooked in cheese, cream, or butter sauce. Fried vegetables. Grains  White bread. White pasta. White rice. Cornbread. Bagels, pastries, and croissants. Crackers and snack foods that contain trans fat   and hydrogenated oils. Meats and other protein foods  Fatty cuts of meat. Ribs, chicken wings, bacon, sausage, bologna, salami, chitterlings, fatback, hot dogs, bratwurst, and packaged lunch meats. Liver and organ meats. Whole eggs and egg yolks. Chicken and turkey with skin. Fried meat. Dairy  Whole or 2% milk, cream,  half-and-half, and cream cheese. Whole milk cheeses. Whole-fat or sweetened yogurt. Full-fat cheeses. Nondairy creamers and whipped toppings. Processed cheese, cheese spreads, and cheese curds. Beverages  Alcohol. Sugar-sweetened drinks such as sodas, lemonade, and fruit drinks. Fats and oils  Butter, stick margarine, lard, shortening, ghee, or bacon fat. Coconut, palm kernel, and palm oils. Sweets and desserts  Corn syrup, sugars, honey, and molasses. Candy. Jam and jelly. Syrup. Sweetened cereals. Cookies, pies, cakes, donuts, muffins, and ice cream. The items listed above may not be a complete list of foods and beverages you should avoid. Contact a dietitian for more information. Summary  Choosing the right foods helps keep your fat and cholesterol at normal levels. This can keep you from getting certain diseases.  At meals, fill one-half of your plate with vegetables and green salads.  Eat high-fiber foods, like whole grains, beans, apples, carrots, peas, and barley.  Limit added sugar, saturated fats, alcohol, and fried foods. This information is not intended to replace advice given to you by your health care provider. Make sure you discuss any questions you have with your health care provider. Document Revised: 04/14/2018 Document Reviewed: 04/28/2017 Elsevier Patient Education  2020 Elsevier Inc.  

## 2019-11-09 NOTE — Assessment & Plan Note (Signed)
Recommend continued focus on healthy diet choices and regular physical activity (30 minutes 5 days a week).  

## 2019-11-10 LAB — COMPREHENSIVE METABOLIC PANEL
ALT: 19 IU/L (ref 0–32)
AST: 15 IU/L (ref 0–40)
Albumin/Globulin Ratio: 1.3 (ref 1.2–2.2)
Albumin: 4 g/dL (ref 3.8–4.8)
Alkaline Phosphatase: 127 IU/L — ABNORMAL HIGH (ref 39–117)
BUN/Creatinine Ratio: 11 — ABNORMAL LOW (ref 12–28)
BUN: 10 mg/dL (ref 8–27)
Bilirubin Total: 0.3 mg/dL (ref 0.0–1.2)
CO2: 22 mmol/L (ref 20–29)
Calcium: 9.5 mg/dL (ref 8.7–10.3)
Chloride: 106 mmol/L (ref 96–106)
Creatinine, Ser: 0.87 mg/dL (ref 0.57–1.00)
GFR calc Af Amer: 82 mL/min/{1.73_m2} (ref >59)
GFR calc non Af Amer: 71 mL/min/{1.73_m2} (ref >59)
Globulin, Total: 3.2 g/dL (ref 1.5–4.5)
Glucose: 115 mg/dL — ABNORMAL HIGH (ref 65–99)
Potassium: 3.9 mmol/L (ref 3.5–5.2)
Sodium: 143 mmol/L (ref 134–144)
Total Protein: 7.2 g/dL (ref 6.0–8.5)

## 2019-11-10 LAB — LIPID PANEL W/O CHOL/HDL RATIO
Cholesterol, Total: 118 mg/dL (ref 100–199)
HDL: 38 mg/dL — ABNORMAL LOW (ref >39)
LDL Chol Calc (NIH): 58 mg/dL (ref 0–99)
Triglycerides: 121 mg/dL (ref 0–149)
VLDL Cholesterol Cal: 22 mg/dL (ref 5–40)

## 2019-11-10 NOTE — Progress Notes (Signed)
Please let Heather Hill know that overall labs continue to look good, there is a mild elevation in alk phosphatase which we sometimes see with gall bladder issues.  Will continue to monitor this on labs and if any elevations further may take look at gall bladder.  Cholesterol levels look great!!  Continue Atorvastatin.  Have a wonderful day!!

## 2019-11-11 ENCOUNTER — Telehealth: Payer: Self-pay | Admitting: *Deleted

## 2019-11-11 NOTE — Telephone Encounter (Signed)
Attempted to contact regarding lung screening referral. However, there is no answer or voicemail option available.

## 2019-11-14 ENCOUNTER — Telehealth: Payer: Self-pay | Admitting: *Deleted

## 2019-11-14 DIAGNOSIS — Z87891 Personal history of nicotine dependence: Secondary | ICD-10-CM

## 2019-11-14 NOTE — Telephone Encounter (Signed)
Received referral for initial lung cancer screening scan. Contacted patient and obtained smoking history,(current, 47 pack year) as well as answering questions related to screening process. Patient denies signs of lung cancer such as weight loss or hemoptysis. Patient denies comorbidity that would prevent curative treatment if lung cancer were found. Patient is scheduled for shared decision making visit and CT scan on 11/22/19 at 2pm.

## 2019-11-22 ENCOUNTER — Ambulatory Visit: Payer: Managed Care, Other (non HMO)

## 2019-11-22 ENCOUNTER — Inpatient Hospital Stay: Payer: Managed Care, Other (non HMO) | Admitting: Nurse Practitioner

## 2019-12-06 ENCOUNTER — Other Ambulatory Visit: Payer: Self-pay

## 2019-12-06 ENCOUNTER — Inpatient Hospital Stay: Payer: Managed Care, Other (non HMO) | Attending: Oncology | Admitting: Oncology

## 2019-12-06 DIAGNOSIS — Z122 Encounter for screening for malignant neoplasm of respiratory organs: Secondary | ICD-10-CM | POA: Diagnosis not present

## 2019-12-06 DIAGNOSIS — Z87891 Personal history of nicotine dependence: Secondary | ICD-10-CM | POA: Diagnosis not present

## 2019-12-06 NOTE — Telephone Encounter (Signed)
Patient has not gotten lung screening scan due to insurance not approving even with clinicals submitted. Will obtain shared decision making visit and send that info for auth. Patient is aware and in agreement with this plan.

## 2019-12-06 NOTE — Progress Notes (Signed)
Virtual Visit via Video Note  I connected with Heather Hill on 12/06/19 at  2:30 PM EDT by a video enabled telemedicine application and verified that I am speaking with the correct person using two identifiers.  Location: Patient: [facility] Provider: Office   I discussed the limitations of evaluation and management by telemedicine and the availability of in person appointments. The patient expressed understanding and agreed to proceed.  I discussed the assessment and treatment plan with the patient. The patient was provided an opportunity to ask questions and all were answered. The patient agreed with the plan and demonstrated an understanding of the instructions.   The patient was advised to call back or seek an in-person evaluation if the symptoms worsen or if the condition fails to improve as anticipated.   In accordance with CMS guidelines, patient has met eligibility criteria including age, absence of signs or symptoms of lung cancer.  Social History   Tobacco Use  . Smoking status: Current Every Day Smoker    Packs/day: 0.50    Years: 48.00    Pack years: 24.00    Types: Cigarettes  . Smokeless tobacco: Never Used  . Tobacco comment: she currently uses a patch  Substance Use Topics  . Alcohol use: Yes    Alcohol/week: 2.0 standard drinks    Types: 2 Glasses of wine per week  . Drug use: Never      A shared decision-making session was conducted prior to the performance of CT scan. This includes one or more decision aids, includes benefits and harms of screening, follow-up diagnostic testing, over-diagnosis, false positive rate, and total radiation exposure.   Counseling on the importance of adherence to annual lung cancer LDCT screening, impact of co-morbidities, and ability or willingness to undergo diagnosis and treatment is imperative for compliance of the program.   Counseling on the importance of continued smoking cessation for former smokers; the importance of smoking  cessation for current smokers, and information about tobacco cessation interventions have been given to patient including [place] and 1800 quit  programs.   Written order for lung cancer screening with LDCT has been given to the patient and any and all questions have been answered to the best of my abilities.    Yearly follow up will be coordinated by Burgess Estelle, Thoracic Navigator.  I provided 15 minutes of face-to-face video visit time during this encounter, and > 50% was spent counseling as documented under my assessment & plan.   Jacquelin Hawking, NP

## 2019-12-22 ENCOUNTER — Other Ambulatory Visit: Payer: Self-pay | Admitting: Nurse Practitioner

## 2019-12-22 ENCOUNTER — Ambulatory Visit: Payer: Managed Care, Other (non HMO) | Admitting: Podiatry

## 2020-01-03 NOTE — Telephone Encounter (Signed)
Was notified that insurance has authorized lung screening scan. Voicemail left to schedule

## 2020-01-03 NOTE — Telephone Encounter (Signed)
Spoke with patient and scheduled for lung screening scan.

## 2020-01-05 NOTE — Telephone Encounter (Signed)
Received information that [employer]ary insurance has [place] as out of network for lung screening scan. Left patient a voicemail to call me to try to arrange for lung screening at an in network location.

## 2020-01-06 ENCOUNTER — Ambulatory Visit: Payer: Managed Care, Other (non HMO)

## 2020-02-06 ENCOUNTER — Ambulatory Visit: Payer: Managed Care, Other (non HMO) | Admitting: Podiatry

## 2020-04-06 ENCOUNTER — Ambulatory Visit (LOCAL_COMMUNITY_HEALTH_CENTER): Payer: Self-pay

## 2020-04-06 ENCOUNTER — Other Ambulatory Visit: Payer: Self-pay

## 2020-04-06 DIAGNOSIS — Z111 Encounter for screening for respiratory tuberculosis: Secondary | ICD-10-CM

## 2020-04-09 ENCOUNTER — Ambulatory Visit (LOCAL_COMMUNITY_HEALTH_CENTER): Payer: BLUE CROSS/BLUE SHIELD

## 2020-04-09 DIAGNOSIS — Z111 Encounter for screening for respiratory tuberculosis: Secondary | ICD-10-CM

## 2020-04-09 LAB — TB SKIN TEST
Induration: 0 mm
TB Skin Test: NEGATIVE

## 2020-05-11 ENCOUNTER — Encounter: Payer: Self-pay | Admitting: Nurse Practitioner

## 2021-05-21 DIAGNOSIS — F332 Major depressive disorder, recurrent severe without psychotic features: Secondary | ICD-10-CM | POA: Diagnosis not present

## 2021-05-21 DIAGNOSIS — F410 Panic disorder [episodic paroxysmal anxiety] without agoraphobia: Secondary | ICD-10-CM | POA: Diagnosis not present

## 2021-07-01 ENCOUNTER — Other Ambulatory Visit: Payer: Self-pay

## 2021-07-01 ENCOUNTER — Ambulatory Visit (INDEPENDENT_AMBULATORY_CARE_PROVIDER_SITE_OTHER): Payer: Medicare HMO | Admitting: Nurse Practitioner

## 2021-07-01 ENCOUNTER — Encounter: Payer: Self-pay | Admitting: Nurse Practitioner

## 2021-07-01 VITALS — BP 95/63 | HR 90 | Temp 98.3°F | Wt 211.0 lb

## 2021-07-01 DIAGNOSIS — E782 Mixed hyperlipidemia: Secondary | ICD-10-CM

## 2021-07-01 DIAGNOSIS — E6609 Other obesity due to excess calories: Secondary | ICD-10-CM

## 2021-07-01 DIAGNOSIS — Z6835 Body mass index (BMI) 35.0-35.9, adult: Secondary | ICD-10-CM

## 2021-07-01 DIAGNOSIS — F331 Major depressive disorder, recurrent, moderate: Secondary | ICD-10-CM

## 2021-07-01 DIAGNOSIS — E66812 Obesity, class 2: Secondary | ICD-10-CM

## 2021-07-01 DIAGNOSIS — F17210 Nicotine dependence, cigarettes, uncomplicated: Secondary | ICD-10-CM | POA: Diagnosis not present

## 2021-07-01 MED ORDER — ATORVASTATIN CALCIUM 10 MG PO TABS: ORAL_TABLET | ORAL | 4 refills | Status: DC

## 2021-07-01 NOTE — Assessment & Plan Note (Signed)
I have recommended complete cessation of tobacco use. I have discussed various options available for assistance with tobacco cessation including over the counter methods (Nicotine gum, patch and lozenges). We also discussed prescription options (Chantix, Nicotine Inhaler / Nasal Spray). The patient is not interested in pursuing any prescription tobacco cessation options at this time.  Referral for lung CA CT screening ordered and discussed with patient at length.  Referral for lung cancer screening placed.

## 2021-07-01 NOTE — Assessment & Plan Note (Signed)
Chronic, ongoing, suspect some PTSD due to past traumatic experience with loss of son & possible Bipolar due to family history.  Denies SI/ HI.  Continue current medication regimen and collaboration with psychiatry. She is aware all psychiatry medications are to remain to be obtained from her psychiatrist.  Check TSH and CBC today.  Return in 6 months.

## 2021-07-01 NOTE — Patient Instructions (Signed)

## 2021-07-01 NOTE — Progress Notes (Signed)
BP 95/63   Pulse 90   Temp 98.3 F (36.8 C) (Oral)   Wt 211 lb (95.7 kg)   SpO2 93%   BMI 35.66 kg/m    Subjective:    Patient ID: Heather Hill, female    DOB: 09/12/1955, 65 y.o.   MRN: [id number]  HPI: Heather Hill is a 65 y.o. female  Chief Complaint  Patient presents with   Medication Refill    Patient states she went without insurance for almost year and she was unable to get her medication. Patient is requesting refills on her Cholesterol medication.    DEPRESSION/ANXIETY Currently on Abilify, Ativan, and Prozac which is managed by [facility], last saw them in October per her report. She does endorse more depression and anxiety recently due to loss of husband. Mood status: stable Satisfied with current treatment?: no Symptom severity: mild  Duration of current treatment : chronic Side effects: no Medication compliance: good compliance Psychotherapy/counseling: not current Previous psychiatric medications: multiple she does not recall Depressed mood: just a little Anxious mood: if does not get enough sleep Anhedonia: no Significant weight loss or gain: no Insomnia: yes hard to fall asleep , continues to take Ativan as needed Fatigue: no Feelings of worthlessness or guilt: no Impaired concentration/indecisiveness: no Suicidal ideations: no Hopelessness: yes Crying spells: no Depression screen [facility] 2/9 07/01/2021 11/09/2019 08/05/2019 01/19/2019 11/16/2018  Decreased Interest 2 3 2 3 2   Down, Depressed, Hopeless 3 1 3 3 2   PHQ - 2 Score 5 4 5 6 4   Altered sleeping 3 3 3 3 3   Tired, decreased energy 3 3 3 3 3   Change in appetite 3 3 3 3 3   Feeling bad or failure about yourself  1 3 1 2 3   Trouble concentrating 3 2 0 2 2  Moving slowly or fidgety/restless 1 2 0 0 0  Suicidal thoughts 0 0 0 0 0  PHQ-9 Score 19 20 15 19 18   Difficult doing work/chores Somewhat difficult Somewhat difficult Somewhat difficult Somewhat difficult -    HYPERLIPIDEMIA Continues on Atorvastatin 10 MG daily, has been out of this due to insurance.  Continues to smoke 1/2 PPD and not interested in quitting.  Started smoking when in 9th grade.  Educated on lung CT screening last visit, but never obtained. Hyperlipidemia status: good compliance Satisfied with current treatment?  yes Side effects:  no Medication compliance: good compliance Past cholesterol meds: atorvastain (lipitor) Supplements: none Aspirin:  no The ASCVD Risk score (Arnett DK, et al., 2019) failed to calculate for the following reasons:   The valid total cholesterol range is 130 to 320 mg/dL   Relevant past medical, surgical, family and social history reviewed and updated as indicated. Interim medical history since our last visit reviewed. Allergies and medications reviewed and updated.  Review of Systems  Constitutional:  Negative for activity change, appetite change, diaphoresis, fatigue and fever.  Respiratory: Negative.    Cardiovascular: Negative.   Psychiatric/Behavioral:  Positive for sleep disturbance. Negative for decreased concentration, self-injury and suicidal ideas. The patient is nervous/anxious.    Per HPI unless specifically indicated above     Objective:    BP 95/63   Pulse 90   Temp 98.3 F (36.8 C) (Oral)   Wt 211 lb (95.7 kg)   SpO2 93%   BMI 35.66 kg/m   Wt Readings from Last 3 Encounters:  07/01/21 211 lb (95.7 kg)  06/10/19 204 lb (92.5 kg)  02/28/19  195 lb 6.4 oz (88.6 kg)    Physical Exam Vitals and nursing note reviewed.  Constitutional:      General: She is awake. She is not in acute distress.    Appearance: She is well-developed. She is not ill-appearing.  HENT:     Head: Normocephalic.     Right Ear: Hearing normal.     Left Ear: Hearing normal.  Eyes:     General: Lids are normal.        Right eye: No discharge.        Left eye: No discharge.     Conjunctiva/sclera: Conjunctivae normal.  Neck:     Thyroid: No  thyromegaly.     Vascular: No carotid bruit.  Cardiovascular:     Rate and Rhythm: Normal rate.     Heart sounds: Normal heart sounds.  No systolic murmur is present.  No diastolic murmur is present.  Pulmonary:     Effort: Pulmonary effort is normal. No accessory muscle usage or respiratory distress.  Abdominal:     General: Bowel sounds are normal.     Palpations: Abdomen is soft.     Tenderness: There is no abdominal tenderness.  Musculoskeletal:        General: Normal range of motion.     Cervical back: Normal range of motion.     Right lower leg: No edema.     Left lower leg: No edema.  Neurological:     Mental Status: She is alert and oriented to person, place, and time.  Psychiatric:        Attention and Perception: Attention normal.        Mood and Affect: Mood normal.        Speech: Speech normal.        Behavior: Behavior normal. Behavior is cooperative.    Results for orders placed or performed in visit on 04/06/20  PPD  Result Value Ref Range   TB Skin Test Negative    Induration 0 mm      Assessment & Plan:   Problem List Items Addressed This Visit       Other   Hyperlipidemia    Chronic, ongoing with risk factors.  Continue statin and adjust dose as needed. Lipid panel and CMP today.  Return in 6 months for follow-up.      Relevant Medications   atorvastatin (LIPITOR) 10 MG tablet   Other Relevant Orders   Comprehensive metabolic panel   Lipid Panel w/o Chol/HDL Ratio   Moderate episode of recurrent major depressive disorder (HCC) - Primary    Chronic, ongoing, suspect some PTSD due to past traumatic experience with loss of son & possible Bipolar due to family history.  Denies SI/ HI.  Continue current medication regimen and collaboration with psychiatry. She is aware all psychiatry medications are to remain to be obtained from her psychiatrist.  Check TSH and CBC today.  Return in 6 months.      Relevant Medications   FLUoxetine (PROZAC) 40 MG  capsule   Other Relevant Orders   CBC with Differential/Platelet   TSH   Nicotine dependence, cigarettes, uncomplicated    I have recommended complete cessation of tobacco use. I have discussed various options available for assistance with tobacco cessation including over the counter methods (Nicotine gum, patch and lozenges). We also discussed prescription options (Chantix, Nicotine Inhaler / Nasal Spray). The patient is not interested in pursuing any prescription tobacco cessation options at this time.  Referral for  lung CA CT screening ordered and discussed with patient at length.  Referral for lung cancer screening placed.       Relevant Orders   Ambulatory Referral Lung Cancer Screening [facility]   Obesity    BMI 35.66.  Recommended eating smaller high protein, low fat meals more frequently and exercising 30 mins a day 5 times a week with a goal of 10-15lb weight loss in the next 3 months. Patient voiced their understanding and motivation to adhere to these recommendations.         Follow up plan: Return in about 6 months (around 12/29/2021) for HLD, MOOD, HEALTH MAINTENANCE CHECK -- NEEDS PCV13/DEXA/MAMMO.

## 2021-07-01 NOTE — Assessment & Plan Note (Signed)
Chronic, ongoing with risk factors.  Continue statin and adjust dose as needed. Lipid panel and CMP today.  Return in 6 months for follow-up. 

## 2021-07-01 NOTE — Assessment & Plan Note (Signed)
BMI 35.66.  Recommended eating smaller high protein, low fat meals more frequently and exercising 30 mins a day 5 times a week with a goal of 10-15lb weight loss in the next 3 months. Patient voiced their understanding and motivation to adhere to these recommendations.

## 2021-07-02 ENCOUNTER — Encounter: Payer: Self-pay | Admitting: Nurse Practitioner

## 2021-07-02 DIAGNOSIS — R7301 Impaired fasting glucose: Secondary | ICD-10-CM | POA: Insufficient documentation

## 2021-07-02 LAB — CBC WITH DIFFERENTIAL/PLATELET
Basophils Absolute: 0.1 10*3/uL (ref 0.0–0.2)
Basos: 1 %
EOS (ABSOLUTE): 0.2 10*3/uL (ref 0.0–0.4)
Eos: 3 %
Hematocrit: 41.4 % (ref 34.0–46.6)
Hemoglobin: 13.6 g/dL (ref 11.1–15.9)
Immature Grans (Abs): 0 10*3/uL (ref 0.0–0.1)
Immature Granulocytes: 0 %
Lymphocytes Absolute: 2.7 10*3/uL (ref 0.7–3.1)
Lymphs: 31 %
MCH: 27 pg (ref 26.6–33.0)
MCHC: 32.9 g/dL (ref 31.5–35.7)
MCV: 82 fL (ref 79–97)
Monocytes Absolute: 0.8 10*3/uL (ref 0.1–0.9)
Monocytes: 10 %
Neutrophils Absolute: 4.8 10*3/uL (ref 1.4–7.0)
Neutrophils: 55 %
Platelets: 319 10*3/uL (ref 150–450)
RBC: 5.04 x10E6/uL (ref 3.77–5.28)
RDW: 14.6 % (ref 11.7–15.4)
WBC: 8.7 10*3/uL (ref 3.4–10.8)

## 2021-07-02 LAB — COMPREHENSIVE METABOLIC PANEL
ALT: 17 IU/L (ref 0–32)
AST: 19 IU/L (ref 0–40)
Albumin/Globulin Ratio: 1.3 (ref 1.2–2.2)
Albumin: 4 g/dL (ref 3.8–4.8)
Alkaline Phosphatase: 104 IU/L (ref 44–121)
BUN/Creatinine Ratio: 14 (ref 12–28)
BUN: 14 mg/dL (ref 8–27)
Bilirubin Total: 0.2 mg/dL (ref 0.0–1.2)
CO2: 25 mmol/L (ref 20–29)
Calcium: 9.6 mg/dL (ref 8.7–10.3)
Chloride: 106 mmol/L (ref 96–106)
Creatinine, Ser: 1.01 mg/dL — ABNORMAL HIGH (ref 0.57–1.00)
Globulin, Total: 3.2 g/dL (ref 1.5–4.5)
Glucose: 122 mg/dL — ABNORMAL HIGH (ref 70–99)
Potassium: 4 mmol/L (ref 3.5–5.2)
Sodium: 140 mmol/L (ref 134–144)
Total Protein: 7.2 g/dL (ref 6.0–8.5)
eGFR: 62 mL/min/{1.73_m2} (ref >59)

## 2021-07-02 LAB — LIPID PANEL W/O CHOL/HDL RATIO
Cholesterol, Total: 191 mg/dL (ref 100–199)
HDL: 41 mg/dL (ref >39)
LDL Chol Calc (NIH): 109 mg/dL — ABNORMAL HIGH (ref 0–99)
Triglycerides: 237 mg/dL — ABNORMAL HIGH (ref 0–149)
VLDL Cholesterol Cal: 41 mg/dL — ABNORMAL HIGH (ref 5–40)

## 2021-07-02 LAB — TSH: TSH: 2.06 u[IU]/mL (ref 0.450–4.500)

## 2021-07-02 NOTE — Progress Notes (Signed)
Good morning crew, please let Sofiya know her labs have returned and overall remain stable with normal kidney and liver function.  Glucose, sugar, was a little elevated.  I will check next visit to ensure no diabetes and recommend heavy focus on diet and regular exercise.  Thyroid was normal.  Cholesterol levels were elevated as expected, restart your statin as ordered.  Any questions? Keep being amazing!!  Thank you for allowing me to participate in your care.  I appreciate you. Kindest regards, Nandika Stetzer

## 2021-08-01 DIAGNOSIS — F410 Panic disorder [episodic paroxysmal anxiety] without agoraphobia: Secondary | ICD-10-CM | POA: Diagnosis not present

## 2021-08-01 DIAGNOSIS — F332 Major depressive disorder, recurrent severe without psychotic features: Secondary | ICD-10-CM | POA: Diagnosis not present

## 2021-11-05 DIAGNOSIS — Z79899 Other long term (current) drug therapy: Secondary | ICD-10-CM | POA: Diagnosis not present

## 2021-12-29 NOTE — Patient Instructions (Addendum)
Please call to schedule your mammogram and/or bone density: ?[facility]  ?Address: [address], [place], [place] [address] ?Phone: [phone]  ? ?Managing Depression, Adult ?Depression is a mental health condition that affects your thoughts, feelings, and actions. Being diagnosed with depression can bring you relief if you did not know why you have felt or behaved a certain way. It could also leave you feeling overwhelmed with uncertainty about your future. Preparing yourself to manage your symptoms can help you feel more positive about your future. ?How to manage lifestyle changes ?Managing stress ? ?Stress is your body's reaction to life changes and events, both good and bad. Stress can add to your feelings of depression. Learning to manage your stress can help lessen your feelings of depression. ?Try some of the following approaches to reducing your stress (stress reduction techniques): ?Listen to music that you enjoy and that inspires you. ?Try using a meditation app or take a meditation class. ?Develop a practice that helps you connect with your spiritual self. Walk in nature, pray, or go to a place of worship. ?Do some deep breathing. To do this, inhale slowly through your nose. Pause at the top of your inhale for a few seconds and then exhale slowly, letting your muscles relax. ?Practice yoga to help relax and work your muscles. ?Choose a stress reduction technique that suits your lifestyle and personality. These techniques take time and practice to develop. Set aside 5-15 minutes a day to do them. Therapists can offer training in these techniques. Other things you can do to manage stress include: ?Keeping a stress diary. ?Knowing your limits and saying no when you think something is too much. ?Paying attention to how you react to certain situations. You may not be able to control everything, but you can change your reaction. ?Adding humor to your life by  watching funny films or TV shows. ?Making time for activities that you enjoy and that relax you. ? ?Medicines ?Medicines, such as antidepressants, are often a part of treatment for depression. ?Talk with your pharmacist or health care provider about all the medicines, supplements, and herbal products that you take, their possible side effects, and what medicines and other products are safe to take together. ?Make sure to report any side effects you may have to your health care provider. ?Relationships ?Your health care provider may suggest family therapy, couples therapy, or individual therapy as part of your treatment. ?How to recognize changes ?Everyone responds differently to treatment for depression. As you recover from depression, you may start to: ?Have more interest in doing activities. ?Feel less hopeless. ?Have more energy. ?Overeat less often, or have a better appetite. ?Have better mental focus. ?It is important to recognize if your depression is not getting better or is getting worse. The symptoms you had in the beginning may return, such as: ?Tiredness (fatigue) or low energy. ?Eating too much or too little. ?Sleeping too much or too little. ?Feeling restless, agitated, or hopeless. ?Trouble focusing or making decisions. ?Unexplained physical complaints. ?Feeling irritable, angry, or aggressive. ?If you or your family members notice these symptoms coming back, let your health care provider know right away. ?Follow these instructions at home: ?Activity ? ?Try to get some form of exercise each day, such as walking, biking, swimming, or lifting weights. ?Practice stress reduction techniques. ?Engage your mind by taking a class or doing some volunteer work. ?Lifestyle ?Get the right amount and quality of sleep. ?Cut down on  using caffeine, tobacco, alcohol, and other potentially harmful substances. ?Eat a healthy diet that includes plenty of vegetables, fruits, whole grains, low-fat dairy products, and lean  protein. Do not eat a lot of foods that are high in solid fats, added sugars, or salt (sodium). ?General instructions ?Take over-the-counter and prescription medicines only as told by your health care provider. ?Keep all follow-up visits as told by your health care provider. This is important. ?Where to find support ?Talking to others ? ?Friends and family members can be sources of support and guidance. Talk to trusted friends or family members about your condition. Explain your symptoms to them, and let them know that you are working with a health care provider to treat your depression. Tell friends and family members how they also can be helpful. ?Finances ?Find appropriate mental health providers that fit with your financial situation. ?Talk with your health care provider about options to get reduced prices on your medicines. ?Where to find more information ?You can find support in your area from: ?Anxiety and Depression Association of America (ADAA): https://www.clark.net/ ?Mental Health America: www.mentalhealthamerica.net ?National Alliance on Mental Illness: www.nami.org ?Contact a health care provider if: ?You stop taking your antidepressant medicines, and you have any of these symptoms: ?Nausea. ?Headache. ?Light-headedness. ?Chills and body aches. ?Not being able to sleep (insomnia). ?You or your friends and family think your depression is getting worse. ?Get help right away if: ?You have thoughts of hurting yourself or others. ?If you ever feel like you may hurt yourself or others, or have thoughts about taking your own life, get help right away. Go to your nearest emergency department or: ?Call your local emergency services (911 in the [place] ?Call a suicide crisis helpline, such as the [facility] at 434-866-2054 or 988 in the [place]. This is open 24 hours a day in the [place] ?Text the Crisis Text Line at 903-373-6095 (in the [place].). ?Summary ?If you are diagnosed with depression, preparing  yourself to manage your symptoms is a good way to feel positive about your future. ?Work with your health care provider on a management plan that includes stress reduction techniques, medicines (if applicable), therapy, and healthy lifestyle habits. ?Keep talking with your health care provider about how your treatment is working. ?If you have thoughts about taking your own life, call a suicide crisis helpline or text a crisis text line. ?This information is not intended to replace advice given to you by your health care provider. Make sure you discuss any questions you have with your health care provider. ?Document Revised: 03/06/2021 Document Reviewed: 06/22/2019 ?Elsevier Patient Education ? [place]. ? ?

## 2021-12-30 ENCOUNTER — Ambulatory Visit: Payer: Medicare Other | Admitting: Nurse Practitioner

## 2022-01-03 ENCOUNTER — Encounter: Payer: Self-pay | Admitting: Nurse Practitioner

## 2022-01-03 ENCOUNTER — Ambulatory Visit (INDEPENDENT_AMBULATORY_CARE_PROVIDER_SITE_OTHER): Payer: Medicare Other | Admitting: Nurse Practitioner

## 2022-01-03 VITALS — BP 97/65 | HR 92 | Temp 98.0°F | Ht 64.0 in | Wt 200.6 lb

## 2022-01-03 DIAGNOSIS — Z780 Asymptomatic menopausal state: Secondary | ICD-10-CM

## 2022-01-03 DIAGNOSIS — E6609 Other obesity due to excess calories: Secondary | ICD-10-CM

## 2022-01-03 DIAGNOSIS — Z23 Encounter for immunization: Secondary | ICD-10-CM

## 2022-01-03 DIAGNOSIS — R7301 Impaired fasting glucose: Secondary | ICD-10-CM

## 2022-01-03 DIAGNOSIS — E782 Mixed hyperlipidemia: Secondary | ICD-10-CM

## 2022-01-03 DIAGNOSIS — F17210 Nicotine dependence, cigarettes, uncomplicated: Secondary | ICD-10-CM

## 2022-01-03 DIAGNOSIS — F331 Major depressive disorder, recurrent, moderate: Secondary | ICD-10-CM

## 2022-01-03 DIAGNOSIS — Z9189 Other specified personal risk factors, not elsewhere classified: Secondary | ICD-10-CM | POA: Diagnosis not present

## 2022-01-03 DIAGNOSIS — Z1231 Encounter for screening mammogram for malignant neoplasm of breast: Secondary | ICD-10-CM

## 2022-01-03 DIAGNOSIS — Z6835 Body mass index (BMI) 35.0-35.9, adult: Secondary | ICD-10-CM

## 2022-01-03 LAB — BAYER DCA HB A1C WAIVED: HB A1C (BAYER DCA - WAIVED): 5.7 % — ABNORMAL HIGH (ref 4.8–5.6)

## 2022-01-03 MED ORDER — ATORVASTATIN CALCIUM 10 MG PO TABS: ORAL_TABLET | ORAL | 4 refills | Status: DC

## 2022-01-03 NOTE — Assessment & Plan Note (Signed)
Noted on recent labs and does take Abilify for mood, will check A1c today. ?

## 2022-01-03 NOTE — Assessment & Plan Note (Signed)
Chronic, ongoing with risk factors.  Continue statin and adjust dose as needed. Lipid panel and CMP today.  Return in 6 months for follow-up. 

## 2022-01-03 NOTE — Assessment & Plan Note (Signed)
Chronic, ongoing, suspect some PTSD due to past traumatic experience with loss of son & possible Bipolar due to family history.  Denies SI/ HI.  Continue current medication regimen and collaboration with psychiatry. She is aware all psychiatry medications are to remain to be obtained from her psychiatrist.  Return in 6 months. 

## 2022-01-03 NOTE — Assessment & Plan Note (Signed)
BMI 34.43.  Recommended eating smaller high protein, low fat meals more frequently and exercising 30 mins a day 5 times a week with a goal of 10-15lb weight loss in the next 3 months. Patient voiced their understanding and motivation to adhere to these recommendations. ? ?

## 2022-01-03 NOTE — Assessment & Plan Note (Signed)
I have recommended complete cessation of tobacco use. I have discussed various options available for assistance with tobacco cessation including over the counter methods (Nicotine gum, patch and lozenges). We also discussed prescription options (Chantix, Nicotine Inhaler / Nasal Spray). The patient is not interested in pursuing any prescription tobacco cessation options at this time.  Referral for lung CA CT screening ordered and discussed with patient at length.  Referral for lung cancer screening placed. ? ?

## 2022-01-03 NOTE — Progress Notes (Signed)
? ?BP 97/65   Pulse 92   Temp 98 ?F (36.7 ?C) (Oral)   Ht _0  (1.626 m)   Wt 200 lb 9.6 oz (91 kg)   SpO2 95%   BMI 34.43 kg/m?   ? ?Subjective:  ? ? Patient ID: Heather Hill, female    DOB: [birth year], 66 y.o.   MRN: [id number] ? ?HPI: ?Heather Hill is a 66 y.o. female ? ?Chief Complaint  ?Patient presents with  ? Hyperlipidemia  ? Depression  ? ?DEPRESSION/ANXIETY ?Currently on Abilify, Ativan, and Prozac which is managed by [facility], last saw 08/01/21 -- returns next month.  Her husband passed away last year, which was difficult for her. ?Mood status: stable ?Satisfied with current treatment?: no ?Symptom severity: mild  ?Duration of current treatment : chronic ?Side effects: no ?Medication compliance: good compliance ?Psychotherapy/counseling: not current ?Previous psychiatric medications: multiple she does not recall ?Depressed mood: just a little ?Anxious mood: if does not get enough sleep ?Anhedonia: no ?Significant weight loss or gain: no ?Insomnia: yes hard to fall asleep , continues to take Ativan as needed ?Fatigue: no ?Feelings of worthlessness or guilt: no ?Impaired concentration/indecisiveness: no ?Suicidal ideations: no ?Hopelessness: yes ?Crying spells: no ? ?  01/03/2022  ? 11:01 AM 07/01/2021  ?  4:06 PM 11/09/2019  ?  3:22 PM 08/05/2019  ?  9:26 AM 01/19/2019  ?  4:45 PM  ?Depression screen PHQ 2/9  ?Decreased Interest _1 ?Down, Depressed, Hopeless _2 ?PHQ - 2 Score _3 ?Altered sleeping _4 ?Tired, decreased energy _5 ?Change in appetite _6 ?Feeling bad or failure about yourself  0 _7 ?Trouble concentrating _8 0 2  ?Moving slowly or fidgety/restless _9 0 0  ?Suicidal thoughts 0 0 0 0 0  ?PHQ-9 Score _10 ?Difficult doing work/chores Not difficult at all Somewhat difficult Somewhat difficult Somewhat difficult Somewhat difficult  ? ? ?  01/03/2022  ? 11:01 AM 07/01/2021  ?  4:08 PM  08/05/2019  ?  9:28 AM 01/19/2019  ?  4:45 PM  ?GAD 7 : Generalized Anxiety Score  ?Nervous, Anxious, on Edge _11 ?Control/stop worrying _12 ?Worry too much - different things _13 ?Trouble relaxing _14 ?Restless 0 2 2 0  ?Easily annoyed or irritable _15 ?Afraid - awful might happen _16 ?Total GAD 7 Score _17 ?Anxiety Difficulty Not difficult at all Somewhat difficult Somewhat difficult Somewhat difficult  ? ?HYPERLIPIDEMIA ?Continues on Atorvastatin 10 MG daily.  Continues on this daily. ? ?Continues to smoke 1/2 PPD and not interested in quitting.  Started smoking when in 9th grade.  Educated on lung CT screening last visit, but never obtained -- did not have insurance. ?Hyperlipidemia status: good compliance ?Satisfied with current treatment?  yes ?Side effects:  no ?Medication compliance: good compliance ?Past cholesterol meds: atorvastain (lipitor) ?Supplements: none ?Aspirin:  no ?The 10-year ASCVD risk score (Arnett DK, et al., 2019) is: 8.3% ?  Values used to calculate the score: ?    Age: 32 years ?    Sex: Female ?    Is Non-Hispanic African American: Yes ?  Diabetic: No ?    Tobacco smoker: Yes ?    Systolic Blood Pressure: 97 mmHg ?    Is BP treated: No ?    HDL Cholesterol: 41 mg/dL ?    Total Cholesterol: 191 mg/dL ? ? ?Relevant past medical, surgical, family and social history reviewed and updated as indicated. Interim medical history since our last visit reviewed. ?Allergies and medications reviewed and updated. ? ?Review of Systems  ?Constitutional:  Negative for activity change, appetite change, diaphoresis, fatigue and fever.  ?Respiratory: Negative.    ?Cardiovascular: Negative.   ?Psychiatric/Behavioral:  Positive for sleep disturbance. Negative for decreased concentration, self-injury and suicidal ideas. The patient is nervous/anxious.   ? ?Per HPI unless specifically indicated above ? ?   ?Objective:  ?  ?BP 97/65   Pulse 92   Temp 98 ?F (36.7 ?C)  (Oral)   Ht _0  (1.626 m)   Wt 200 lb 9.6 oz (91 kg)   SpO2 95%   BMI 34.43 kg/m?   ?Wt Readings from Last 3 Encounters:  ?01/03/22 200 lb 9.6 oz (91 kg)  ?07/01/21 211 lb (95.7 kg)  ?06/10/19 204 lb (92.5 kg)  ?  ?Physical Exam ?Vitals and nursing note reviewed.  ?Constitutional:   ?   General: She is awake. She is not in acute distress. ?   Appearance: She is well-developed. She is not ill-appearing.  ?HENT:  ?   Head: Normocephalic.  ?   Right Ear: Hearing normal.  ?   Left Ear: Hearing normal.  ?Eyes:  ?   General: Lids are normal.     ?   Right eye: No discharge.     ?   Left eye: No discharge.  ?   Conjunctiva/sclera: Conjunctivae normal.  ?Neck:  ?   Thyroid: No thyromegaly.  ?   Vascular: No carotid bruit.  ?Cardiovascular:  ?   Rate and Rhythm: Normal rate.  ?   Heart sounds: Normal heart sounds.  ?No systolic murmur is present.  ?No diastolic murmur is present.  ?Pulmonary:  ?   Effort: Pulmonary effort is normal. No accessory muscle usage or respiratory distress.  ?Abdominal:  ?   General: Bowel sounds are normal.  ?   Palpations: Abdomen is soft.  ?   Tenderness: There is no abdominal tenderness.  ?Musculoskeletal:     ?   General: Normal range of motion.  ?   Cervical back: Normal range of motion.  ?   Right lower leg: No edema.  ?   Left lower leg: No edema.  ?Neurological:  ?   Mental Status: She is alert and oriented to person, place, and time.  ?Psychiatric:     ?   Attention and Perception: Attention normal.     ?   Mood and Affect: Mood normal.     ?   Speech: Speech normal.     ?   Behavior: Behavior normal. Behavior is cooperative.  ? ? ?Results for orders placed or performed in visit on 07/01/21  ?CBC with Differential/Platelet  ?Result Value Ref Range  ? WBC 8.7 3.4 - 10.8 x10E3/uL  ? RBC 5.04 3.77 - 5.28 x10E6/uL  ? Hemoglobin 13.6 11.1 - 15.9 g/dL  ? Hematocrit 41.4 34.0 - 46.6 %  ? MCV 82 79 - 97 fL  ? MCH 27.0 26.6 - 33.0 pg  ? MCHC 32.9 31.5 - 35.7 g/dL  ? RDW 14.6 11.7 - 15.4 %  ?  Platelets 319 150 -  450 x10E3/uL  ? Neutrophils 55 Not Estab. %  ? Lymphs 31 Not Estab. %  ? Monocytes 10 Not Estab. %  ? Eos 3 Not Estab. %  ? Basos 1 Not Estab. %  ? Neutrophils Absolute 4.8 1.4 - 7.0 x10E3/uL  ? Lymphocytes Absolute 2.7 0.7 - 3.1 x10E3/uL  ? Monocytes Absolute 0.8 0.1 - 0.9 x10E3/uL  ? EOS (ABSOLUTE) 0.2 0.0 - 0.4 x10E3/uL  ? Basophils Absolute 0.1 0.0 - 0.2 x10E3/uL  ? Immature Granulocytes 0 Not Estab. %  ? Immature Grans (Abs) 0.0 0.0 - 0.1 x10E3/uL  ?Comprehensive metabolic panel  ?Result Value Ref Range  ? Glucose 122 (H) 70 - 99 mg/dL  ? BUN 14 8 - 27 mg/dL  ? Creatinine, Ser 1.01 (H) 0.57 - 1.00 mg/dL  ? eGFR 62 >59 mL/min/1.73  ? BUN/Creatinine Ratio 14 12 - 28  ? Sodium 140 134 - 144 mmol/L  ? Potassium 4.0 3.5 - 5.2 mmol/L  ? Chloride 106 96 - 106 mmol/L  ? CO2 25 20 - 29 mmol/L  ? Calcium 9.6 8.7 - 10.3 mg/dL  ? Total Protein 7.2 6.0 - 8.5 g/dL  ? Albumin 4.0 3.8 - 4.8 g/dL  ? Globulin, Total 3.2 1.5 - 4.5 g/dL  ? Albumin/Globulin Ratio 1.3 1.2 - 2.2  ? Bilirubin Total 0.2 0.0 - 1.2 mg/dL  ? Alkaline Phosphatase 104 44 - 121 IU/L  ? AST 19 0 - 40 IU/L  ? ALT 17 0 - 32 IU/L  ?Lipid Panel w/o Chol/HDL Ratio  ?Result Value Ref Range  ? Cholesterol, Total 191 100 - 199 mg/dL  ? Triglycerides 237 (H) 0 - 149 mg/dL  ? HDL 41 >39 mg/dL  ? VLDL Cholesterol Cal 41 (H) 5 - 40 mg/dL  ? LDL Chol Calc (NIH) 109 (H) 0 - 99 mg/dL  ?TSH  ?Result Value Ref Range  ? TSH 2.060 0.450 - 4.500 uIU/mL  ? ?   ?Assessment & Plan:  ? ?Problem List Items Addressed This Visit   ? ?  ? Endocrine  ? IFG (impaired fasting glucose)  ?  Noted on recent labs and does take Abilify for mood, will check A1c today. ? ?  ?  ? Relevant Orders  ? Bayer DCA Hb A1c Waived  ?  ? Other  ? At high risk for caregiver role strain  ? Hyperlipidemia  ?  Chronic, ongoing with risk factors.  Continue statin and adjust dose as needed. Lipid panel and CMP today.  Return in 6 months for follow-up. ? ?  ?  ? Relevant Medications  ?  atorvastatin (LIPITOR) 10 MG tablet  ? Other Relevant Orders  ? Comprehensive metabolic panel  ? Lipid Panel w/o Chol/HDL Ratio  ? Moderate episode of recurrent major depressive disorder ([place]) - Primary  ?  Chronic, ongoing, suspect s

## 2022-01-04 LAB — COMPREHENSIVE METABOLIC PANEL
ALT: 14 IU/L (ref 0–32)
AST: 10 IU/L (ref 0–40)
Albumin/Globulin Ratio: 1.5 (ref 1.2–2.2)
Albumin: 4.3 g/dL (ref 3.8–4.8)
Alkaline Phosphatase: 113 IU/L (ref 44–121)
BUN/Creatinine Ratio: 13 (ref 12–28)
BUN: 12 mg/dL (ref 8–27)
Bilirubin Total: 0.3 mg/dL (ref 0.0–1.2)
CO2: 26 mmol/L (ref 20–29)
Calcium: 9.7 mg/dL (ref 8.7–10.3)
Chloride: 101 mmol/L (ref 96–106)
Creatinine, Ser: 0.93 mg/dL (ref 0.57–1.00)
Globulin, Total: 2.8 g/dL (ref 1.5–4.5)
Glucose: 99 mg/dL (ref 70–99)
Potassium: 4.4 mmol/L (ref 3.5–5.2)
Sodium: 138 mmol/L (ref 134–144)
Total Protein: 7.1 g/dL (ref 6.0–8.5)
eGFR: 68 mL/min/{1.73_m2} (ref >59)

## 2022-01-04 LAB — LIPID PANEL W/O CHOL/HDL RATIO
Cholesterol, Total: 115 mg/dL (ref 100–199)
HDL: 46 mg/dL (ref >39)
LDL Chol Calc (NIH): 55 mg/dL (ref 0–99)
Triglycerides: 68 mg/dL (ref 0–149)
VLDL Cholesterol Cal: 14 mg/dL (ref 5–40)

## 2022-01-04 NOTE — Progress Notes (Signed)
Good morning crew, please let Heather Hill know her labs have returned and overall remain stable.  Continue all current medications, no changes needed.  Great job!!  Do not forget to scheduled your mammogram and bone density testing at [facility]. ?Keep being amazing!!  Thank you for allowing me to participate in your care.  I appreciate you. ?Kindest regards, ?[name] ?

## 2022-01-08 ENCOUNTER — Telehealth: Payer: Self-pay | Admitting: Nurse Practitioner

## 2022-01-08 NOTE — Telephone Encounter (Signed)
Copied from [place] [phone]. Topic: General - Other ?>> Jan 08, 2022  8:55 AM Valere Dross wrote: ?Reason for CRM: Pt called in stating for her referral to the [facility], she stated once she reached out, they told her they would need some office notes and labs sent over before getting the pt scheduled, there fax is [phone]. Please advise. ?

## 2022-01-08 NOTE — Telephone Encounter (Signed)
Looks like patient was referred for lung cancer screening. Would you be able to help with this? ? ?

## 2022-01-10 ENCOUNTER — Other Ambulatory Visit: Payer: Self-pay | Admitting: *Deleted

## 2022-01-10 DIAGNOSIS — F17210 Nicotine dependence, cigarettes, uncomplicated: Secondary | ICD-10-CM

## 2022-01-10 DIAGNOSIS — Z87891 Personal history of nicotine dependence: Secondary | ICD-10-CM

## 2022-01-10 DIAGNOSIS — Z122 Encounter for screening for malignant neoplasm of respiratory organs: Secondary | ICD-10-CM

## 2022-01-10 NOTE — Telephone Encounter (Signed)
Spoke with pt and scheduled lung cancer screening CT scan for 01/22/2022. Pt already had shared decision making visit on 12/06/19.

## 2022-01-22 ENCOUNTER — Ambulatory Visit
Admission: RE | Admit: 2022-01-22 | Discharge: 2022-01-22 | Disposition: A | Discharge status: Home / Self Care | Payer: Medicare Other | Source: Ambulatory Visit | Attending: Acute Care | Admitting: Acute Care

## 2022-01-22 DIAGNOSIS — F17210 Nicotine dependence, cigarettes, uncomplicated: Secondary | ICD-10-CM | POA: Insufficient documentation

## 2022-01-22 DIAGNOSIS — Z87891 Personal history of nicotine dependence: Secondary | ICD-10-CM | POA: Insufficient documentation

## 2022-01-22 DIAGNOSIS — Z122 Encounter for screening for malignant neoplasm of respiratory organs: Secondary | ICD-10-CM | POA: Diagnosis not present

## 2022-01-23 ENCOUNTER — Telehealth: Payer: Self-pay | Admitting: Nurse Practitioner

## 2022-01-23 ENCOUNTER — Telehealth: Payer: Self-pay | Admitting: Acute Care

## 2022-01-23 DIAGNOSIS — R590 Localized enlarged lymph nodes: Secondary | ICD-10-CM

## 2022-01-23 NOTE — Telephone Encounter (Signed)
-----   Message from Christie Beckers, RN sent at 01/23/2022 12:03 PM EDT ----- Orpah Cobb, I see that this pt is scheduled for a routine mammo on 03/04/22. This is her first CT done through the screening program so nothing to compare it to really. I have not called the pt yet. Eric Form, NP is out of the office until Monday. I wanted to make sure someone sees this to address it with the pt.

## 2022-01-23 NOTE — Telephone Encounter (Signed)
Attempted to call patient about lung CT results, no answer and voicemail box is full.  Will have staff attempt to call as PCP is on vacation upcoming days.  She has mammogram scheduled 03/04/22, will try to get scheduled sooner.

## 2022-01-23 NOTE — Telephone Encounter (Signed)
This is an FYI to the provider.

## 2022-01-24 NOTE — Telephone Encounter (Signed)
Attempted to contact patient Heather Hill, unable to LVM.   OK for [facility] to give patient results if patient calls back.

## 2022-01-24 NOTE — Telephone Encounter (Signed)
Scheduled patient for 02/12/22 at 9:40, patient is aware.

## 2022-01-24 NOTE — Telephone Encounter (Signed)
Pt. Given CT results per PCP, verbalizes understanding. States any time further tests can be scheduled is fine. Please let pt. Know when this is done.

## 2022-01-24 NOTE — Telephone Encounter (Signed)
See other telephone note from 01/23/22 from Marnee Guarneri, NP

## 2022-01-27 ENCOUNTER — Other Ambulatory Visit: Payer: Self-pay | Admitting: Acute Care

## 2022-01-27 DIAGNOSIS — F17210 Nicotine dependence, cigarettes, uncomplicated: Secondary | ICD-10-CM

## 2022-01-27 DIAGNOSIS — Z87891 Personal history of nicotine dependence: Secondary | ICD-10-CM

## 2022-01-27 DIAGNOSIS — Z122 Encounter for screening for malignant neoplasm of respiratory organs: Secondary | ICD-10-CM

## 2022-01-27 NOTE — Telephone Encounter (Signed)
Yes I will send a letter regarding the lung rads s findings of her CT. I have placed order for 12 mth follow up lung screening CT scan.

## 2022-02-12 ENCOUNTER — Ambulatory Visit
Admission: RE | Admit: 2022-02-12 | Discharge: 2022-02-12 | Disposition: A | Discharge status: Home / Self Care | Payer: Medicare Other | Source: Ambulatory Visit | Attending: Nurse Practitioner | Admitting: Nurse Practitioner

## 2022-02-12 ENCOUNTER — Other Ambulatory Visit: Payer: Self-pay | Admitting: Nurse Practitioner

## 2022-02-12 DIAGNOSIS — R590 Localized enlarged lymph nodes: Secondary | ICD-10-CM | POA: Diagnosis not present

## 2022-02-12 DIAGNOSIS — R922 Inconclusive mammogram: Secondary | ICD-10-CM | POA: Diagnosis not present

## 2022-02-12 DIAGNOSIS — N6489 Other specified disorders of breast: Secondary | ICD-10-CM | POA: Diagnosis not present

## 2022-02-13 ENCOUNTER — Other Ambulatory Visit: Payer: Self-pay | Admitting: Nurse Practitioner

## 2022-02-13 DIAGNOSIS — N63 Unspecified lump in unspecified breast: Secondary | ICD-10-CM

## 2022-02-13 DIAGNOSIS — R921 Mammographic calcification found on diagnostic imaging of breast: Secondary | ICD-10-CM

## 2022-02-13 DIAGNOSIS — R928 Other abnormal and inconclusive findings on diagnostic imaging of breast: Secondary | ICD-10-CM

## 2022-03-04 ENCOUNTER — Ambulatory Visit
Admission: RE | Admit: 2022-03-04 | Discharge: 2022-03-04 | Disposition: A | Discharge status: Home / Self Care | Payer: Medicare Other | Source: Ambulatory Visit | Attending: Nurse Practitioner | Admitting: Nurse Practitioner

## 2022-03-04 DIAGNOSIS — Z780 Asymptomatic menopausal state: Secondary | ICD-10-CM | POA: Insufficient documentation

## 2022-03-04 NOTE — Progress Notes (Signed)
Good afternoon, please let Heather Hill know her bone density testing returned normal.  Great news.  We will repeat in 10 years.  Have a wonderful day!!

## 2022-03-06 ENCOUNTER — Ambulatory Visit
Admission: RE | Admit: 2022-03-06 | Discharge: 2022-03-06 | Disposition: A | Discharge status: Home / Self Care | Payer: Medicare Other | Source: Ambulatory Visit | Attending: Nurse Practitioner | Admitting: Nurse Practitioner

## 2022-03-06 DIAGNOSIS — R590 Localized enlarged lymph nodes: Secondary | ICD-10-CM | POA: Diagnosis not present

## 2022-03-06 DIAGNOSIS — R921 Mammographic calcification found on diagnostic imaging of breast: Secondary | ICD-10-CM | POA: Insufficient documentation

## 2022-03-06 DIAGNOSIS — R928 Other abnormal and inconclusive findings on diagnostic imaging of breast: Secondary | ICD-10-CM | POA: Diagnosis not present

## 2022-03-06 DIAGNOSIS — N63 Unspecified lump in unspecified breast: Secondary | ICD-10-CM

## 2022-03-06 DIAGNOSIS — N6082 Other benign mammary dysplasias of left breast: Secondary | ICD-10-CM | POA: Diagnosis not present

## 2022-03-06 HISTORY — PX: BREAST BIOPSY: SHX20

## 2022-03-07 LAB — SURGICAL PATHOLOGY

## 2022-03-11 ENCOUNTER — Encounter: Payer: Self-pay | Admitting: Diagnostic Radiology

## 2022-03-11 LAB — SURGICAL PATHOLOGY

## 2022-03-13 ENCOUNTER — Other Ambulatory Visit: Payer: Self-pay

## 2022-03-13 DIAGNOSIS — D360 Benign neoplasm of lymph nodes: Secondary | ICD-10-CM

## 2022-03-13 NOTE — Progress Notes (Signed)
Spoke with Heather Hill regarding request to see surgeon for consideration of excision of axillary lymph node. She is agreeable to referral. Referral sent to [facility]. They will contact her for scheduling.

## 2022-03-25 HISTORY — PX: BREAST EXCISIONAL BIOPSY: SUR124

## 2022-03-26 ENCOUNTER — Encounter: Payer: Self-pay | Admitting: Surgery

## 2022-03-26 ENCOUNTER — Ambulatory Visit: Payer: Medicare Other | Admitting: Surgery

## 2022-03-26 VITALS — BP 106/70 | HR 97 | Temp 98.0°F | Ht 65.0 in | Wt 194.2 lb

## 2022-03-26 DIAGNOSIS — R590 Localized enlarged lymph nodes: Secondary | ICD-10-CM

## 2022-03-26 NOTE — H&P (View-Only) (Signed)
03/26/2022  Reason for Visit:  Left axillary lymphadenopathy  Requesting Provider:  Jolene Cannady, NP  History of Present Illness: Heather Hill is a 65 y.o. female presenting for evaluation of left axillary lymphadenopathy.  The patient had a CT chest for lung cancer screening on 01/22/22, which noted bilateral axillary lymph nodes, with the left side having a 2.3 x 1.6 cm rounded suspicious node.  Given the findings, she had a diagnostic mammogram and ultrasound on 02/12/22, which showed some 9mm calcifications in the left breast upper outer quadrant, in addition to the 2.8 x 1.7 x 1.8 cm lymph node.  There was also an adjacent lymph node measuring 1 x 0.7 x 0.6 cm.  She had a left breast biopsy and left axillary node biopsy on 03/06/22, and the pathology for both are benign.  There is no evidence of breast cancer or DCIS in the breast, and no evidence of cancer or abnormal lymphocyte population.    The patient reports that she has probably felt the mass in the left axilla for about 2-3 years, since about the time of her husband's death.  She denies having any pain from it, and denies feeling any masses in the left breast.  Denies any skin changes or nipple changes.  Currently there is some soreness from the biopsies themselves.  She denies any fevers, chills, weight loss, sweats, fatigue, or malaise.  Past Medical History: Past Medical History:  Diagnosis Date   Anxiety    Depression    Vertigo    none for over 8 yrs   Wears dentures    partial upper     Past Surgical History: Past Surgical History:  Procedure Laterality Date   BREAST BIOPSY Left 03/06/2022   stereo bx, calcs, "COIL" clip-path pending   COLONOSCOPY WITH PROPOFOL N/A 02/02/2019   Procedure: COLONOSCOPY WITH PROPOFOL;  Surgeon: Vanga, Rohini Reddy, MD;  Location: [facility];  Service: Endoscopy;  Laterality: N/A;   POLYPECTOMY  02/02/2019   Procedure: POLYPECTOMY;  Surgeon: Vanga, Rohini Reddy, MD;   Location: [facility];  Service: Endoscopy;;   TOTAL ABDOMINAL HYSTERECTOMY     TUBAL LIGATION      Home Medications: Prior to Admission medications   Medication Sig Start Date End Date Taking? Authorizing Provider  ARIPiprazole (ABILIFY) 5 MG tablet Take 5 mg by mouth daily.  10/26/18  Yes [provider]  atorvastatin (LIPITOR) 10 MG tablet TAKE 1 TABLET(10 MG) BY MOUTH DAILY 01/03/22  Yes Cannady, Jolene T, NP  FLUoxetine (PROZAC) 20 MG capsule Take 20 mg by mouth daily.    Yes [provider]  FLUoxetine (PROZAC) 40 MG capsule Take 40 mg by mouth 2 (two) times daily. 06/21/21  Yes [provider]  LORazepam (ATIVAN) 1 MG tablet Take 1 mg by mouth every 8 (eight) hours as needed.    Yes [provider]  traZODone (DESYREL) 100 MG tablet Take 100 mg by mouth at bedtime. 11/21/21  Yes [provider]    Allergies: Allergies  Allergen Reactions   Peanut-Containing Drug Products Itching and Rash    Peanuts    Social History:  reports that she has been smoking cigarettes. She has a 24.00 pack-year smoking history. She has never used smokeless tobacco. She reports current alcohol use of about 2.0 standard drinks of alcohol per week. She reports that she does not use drugs.   Family History: Family History  Problem Relation Age of Onset   Diabetes Mother      Alzheimer's disease Mother    Hypertension Father    Asthma Father    Diabetes Father    Bipolar disorder Sister    Diabetes Sister    Breast cancer Maternal Aunt    Diabetes Maternal Grandfather    Alzheimer's disease Maternal Grandfather    Heart disease Maternal Grandfather    Asthma Paternal Grandfather    Diabetes Paternal Grandfather    Hypertension Paternal Grandfather    Diabetes Brother     Review of Systems: Review of Systems  Constitutional:  Negative for chills, fever, malaise/fatigue and weight loss.  HENT:  Negative for hearing loss.   Respiratory:   Negative for shortness of breath.   Cardiovascular:  Negative for chest pain.  Gastrointestinal:  Negative for abdominal pain, nausea and vomiting.  Genitourinary:  Negative for dysuria.  Musculoskeletal:  Negative for myalgias.  Skin:  Negative for rash.  Neurological:  Negative for dizziness.  Psychiatric/Behavioral:  Negative for depression.     Physical Exam BP 106/70   Pulse 97   Temp 98 F (36.7 C) (Oral)   Ht 5' 5" (1.651 m)   Wt 194 lb 3.2 oz (88.1 kg)   SpO2 95%   BMI 32.32 kg/m  CONSTITUTIONAL: No acute distress, well nourished. HEENT:  Normocephalic, atraumatic, extraocular motion intact. NECK: Trachea is midline, and there is no jugular venous distension.  RESPIRATORY:  Lungs are clear, and breath sounds are equal bilaterally. Normal respiratory effort without pathologic use of accessory muscles. CARDIOVASCULAR: Heart is regular without murmurs, gallops, or rubs. BREAST:  The left breast has no palpable masses, skin changes other than biopsy site, and no nipple changes.  The left axilla has some palpable lymphadenopathy, though at first the 2.8 cm lymph node was difficult to palpate, I was able to localized it at bedside with ultrasound and then palpate it better.  Right breast has no palpable masses, skin changes, or nipple changes.  Right axilla without any lymphadenopathy. MUSCULOSKELETAL:  Normal muscle strength and tone in all four extremities.  No peripheral edema or cyanosis. SKIN: Skin turgor is normal. There are no pathologic skin lesions.  NEUROLOGIC:  Motor and sensation is grossly normal.  Cranial nerves are grossly intact. PSYCH:  Alert and oriented to person, place and time. Affect is normal.  Laboratory Analysis: Left breast biopsy 03/06/22: DIAGNOSIS:  A.  BREAST, LEFT UPPER OUTER QUADRANT; STEREOTACTIC CORE NEEDLE BIOPSY:  - BENIGN MAMMARY PARENCHYMA WITH STROMAL FIBROSIS, FIBROADENOMATOID CHANGES, AND FOCAL FAT NECROSIS, WITH ASSOCIATED COARSE  DYSTROPHIC CALCIFICATIONS.  - NEGATIVE FOR ATYPICAL PROLIFERATIVE BREAST DISEASE.   Left axillary lymph node biopsy 03/06/22: DIAGNOSIS:  A. LYMPH NODE, LEFT AXILLARY; ULTRASOUND GUIDED CORE NEEDLE BIOPSY:  - MULTIPLE DISAGGREGATED FRAGMENTS OF LYMPH NODE, FAVOR BENIGN, WITH REACTIVE PARACORTICAL HYPERPLASIA.   Comment:  Biopsy cores display fragmented portions of lymph node with intact follicular architecture, and germinal centers comprised of a normal mix of small centrocytes, larger centroblasts, and tingible body  macrophages.  There is mild expansion of mantle/marginal zones, as well as interfollicular/paracortical regions.  No abnormal large lymphocyte population is identified.  There is no evidence of metastatic carcinoma.   Concurrent flow cytometric studies show no immunophenotypic evidence of monoclonal B cells or aberrant T cells. For further details, see scanned report in [facility].   Taken together, the findings are most suggestive of sampling of a benign lymph node with reactive hyperplasia. If clinical concern persists, excisional biopsy may provide a more comprehensive assessment of overall lymph node architecture.     Labs from 01/03/22: Na 138, K 4.4, Cl 101, CO2 26, BUN 12, Cr 0.93.  Total bili 0.3, AST 10, Alt 14, Alk Phos 113, albumin 4.3.    Imaging: Mammogram and U/S 02/12/22: FINDINGS: Diagnostic images of the LEFT breast demonstrate enlarged LEFT axillary lymph nodes. There is a dominant rounded LEFT axillary oval mass, likely a lymph node, measuring approximately 29 mm. Spot magnification views of the LEFT breast demonstrate a 9 mm group of coarse heterogeneous calcifications in the LEFT upper outer breast at middle depth. No additional suspicious findings are noted in the LEFT breast. No suspicious mass, distortion, or microcalcifications are identified to suggest presence of malignancy in the RIGHT breast.   Targeted ultrasound was performed of the LEFT axilla. In the LEFT  axillary tail, there is an oval hypoechoic mass with circumscribed margins. It measures 2.8 x 1.7 by 1.8 cm. It is not demonstrate a discrete fatty hilum. This corresponds to the dominant mass/favored lymph node noted on prior CT and mammogram.   Immediately adjacent is an additional similar-appearing oval hypoechoic mass which measures 10 x 7 by 6 mm. This does not demonstrate a normal echogenic hilum. There is an additional immediately adjacent lymph node which demonstrates cortical thickening of approximately 3-4 mm with a preserved echogenic hila.   IMPRESSION: 1. There is a 28 mm mass in the LEFT axilla which likely reflects an abnormal lymph node. Recommend ultrasound-guided biopsy for definitive characterization and to evaluate for underlying lymphoproliferative process. 2. Adjacent to the 28 mm mass, there are 2 additional abnormal appearing lymph nodes. Recommend management of these lymph nodes be based upon biopsy results. 3. A 9 mm group of coarse heterogeneous calcifications in the LEFT upper outer breast are indeterminate. They are unlikely to be related to LEFT axillary adenopathy. Recommend stereotactic guided biopsy for definitive characterization. 4. No mammographic evidence of malignancy in the RIGHT breast.  Assessment and Plan: This is a 65 y.o. female with left axillary lymphadenopathy.  --Discussed with the patient the results of her imaging studies and the two biopsies done.  The left breast biopsy was negative for atypia or malignancy.  The axillary lymph node biopsy also shows no evidence of malignancy and normal lymphocyte population.  However, both pathology and radiology recommend that given the findings, may be prudent to excise the lymph node for better evaluation.  I agree with the recommendations and discussed with the patient that I do not think these would be reactive since she has not had any recent infections or vaccinations.  She's in agreement and wants to proceed  with excision to be 100% sure of the diagnosis. --Reviewed with her the plan for an ultrasound guided excision of left axillary lymph node.  Since I was able to visualize the lymph node with biopsy clip using bedside ultrasound and on palpation, I do not think we need to further localize it with RF tag.  Since this is not cancer either, would not need to excise the calcifications in the left breast or do a sentinel node biopsy either.  Discussed the planned incision, risks of bleeding, infection, injury to surrounding structures, lymphedema, post-op activity restrictions, that this would be an outpatient surgery, and she's willing to proceed. --Will schedule her for 04/03/22.  I spent 55 minutes dedicated to the care of this patient on the date of this encounter to include pre-visit review of records, face-to-face time with the patient discussing diagnosis and management, and any post-visit coordination of care.   Sharnise Blough   Wynne Dust, MD [facility]

## 2022-03-26 NOTE — Progress Notes (Signed)
03/26/2022  Reason for Visit:  Left axillary lymphadenopathy  Requesting Provider:  Marnee Guarneri, NP  History of Present Illness: Heather Hill is a 66 y.o. female presenting for evaluation of left axillary lymphadenopathy.  The patient had a CT chest for lung cancer screening on 01/22/22, which noted bilateral axillary lymph nodes, with the left side having a 2.3 x 1.6 cm rounded suspicious node.  Given the findings, she had a diagnostic mammogram and ultrasound on 02/12/22, which showed some 71m calcifications in the left breast upper outer quadrant, in addition to the 2.8 x 1.7 x 1.8 cm lymph node.  There was also an adjacent lymph node measuring 1 x 0.7 x 0.6 cm.  She had a left breast biopsy and left axillary node biopsy on 03/06/22, and the pathology for both are benign.  There is no evidence of breast cancer or DCIS in the breast, and no evidence of cancer or abnormal lymphocyte population.    The patient reports that she has probably felt the mass in the left axilla for about 2-3 years, since about the time of her husband's death.  She denies having any pain from it, and denies feeling any masses in the left breast.  Denies any skin changes or nipple changes.  Currently there is some soreness from the biopsies themselves.  She denies any fevers, chills, weight loss, sweats, fatigue, or malaise.  Past Medical History: Past Medical History:  Diagnosis Date   Anxiety    Depression    Vertigo    none for over 8 yrs   Wears dentures    partial upper     Past Surgical History: Past Surgical History:  Procedure Laterality Date   BREAST BIOPSY Left 03/06/2022   stereo bx, calcs, "COIL" clip-path pending   COLONOSCOPY WITH PROPOFOL N/A 02/02/2019   Procedure: COLONOSCOPY WITH PROPOFOL;  Surgeon: VLin Landsman MD;  [place] [facility]  Service: Endoscopy;  Laterality: N/A;   POLYPECTOMY  02/02/2019   Procedure: POLYPECTOMY;  Surgeon: VLin Landsman MD;   [place] [facility]  Service: Endoscopy;;   TOTAL ABDOMINAL HYSTERECTOMY     TUBAL LIGATION      Home Medications: Prior to Admission medications   Medication Sig Start Date End Date Taking? Authorizing Provider  ARIPiprazole (ABILIFY) 5 MG tablet Take 5 mg by mouth daily.  10/26/18  Yes [provider]  atorvastatin (LIPITOR) 10 MG tablet TAKE 1 TABLET(10 MG) BY MOUTH DAILY 01/03/22  Yes Cannady, Jolene T, NP  FLUoxetine (PROZAC) 20 MG capsule Take 20 mg by mouth daily.    Yes [provider]  FLUoxetine (PROZAC) 40 MG capsule Take 40 mg by mouth 2 (two) times daily. 06/21/21  Yes [provider]  LORazepam (ATIVAN) 1 MG tablet Take 1 mg by mouth every 8 (eight) hours as needed.    Yes [provider]  traZODone (DESYREL) 100 MG tablet Take 100 mg by mouth at bedtime. 11/21/21  Yes [provider]    Allergies: Allergies  Allergen Reactions   Peanut-Containing Drug Products Itching and Rash    Peanuts    Social History:  reports that she has been smoking cigarettes. She has a 24.00 pack-year smoking history. She has never used smokeless tobacco. She reports current alcohol use of about 2.0 standard drinks of alcohol per week. She reports that she does not use drugs.   Family History: Family History  Problem Relation Age of Onset   Diabetes Mother  Alzheimer's disease Mother    Hypertension Father    Asthma Father    Diabetes Father    Bipolar disorder Sister    Diabetes Sister    Breast cancer Maternal Aunt    Diabetes Maternal Grandfather    Alzheimer's disease Maternal Grandfather    Heart disease Maternal Grandfather    Asthma Paternal Grandfather    Diabetes Paternal Grandfather    Hypertension Paternal Grandfather    Diabetes Brother     Review of Systems: Review of Systems  Constitutional:  Negative for chills, fever, malaise/fatigue and weight loss.  HENT:  Negative for hearing loss.   Respiratory:   Negative for shortness of breath.   Cardiovascular:  Negative for chest pain.  Gastrointestinal:  Negative for abdominal pain, nausea and vomiting.  Genitourinary:  Negative for dysuria.  Musculoskeletal:  Negative for myalgias.  Skin:  Negative for rash.  Neurological:  Negative for dizziness.  Psychiatric/Behavioral:  Negative for depression.     Physical Exam BP 106/70   Pulse 97   Temp 98 F (36.7 C) (Oral)   Ht 5' 5" (1.651 m)   Wt 194 lb 3.2 oz (88.1 kg)   SpO2 95%   BMI 32.32 kg/m  CONSTITUTIONAL: No acute distress, well nourished. HEENT:  Normocephalic, atraumatic, extraocular motion intact. NECK: Trachea is midline, and there is no jugular venous distension.  RESPIRATORY:  Lungs are clear, and breath sounds are equal bilaterally. Normal respiratory effort without pathologic use of accessory muscles. CARDIOVASCULAR: Heart is regular without murmurs, gallops, or rubs. BREAST:  The left breast has no palpable masses, skin changes other than biopsy site, and no nipple changes.  The left axilla has some palpable lymphadenopathy, though at first the 2.8 cm lymph node was difficult to palpate, I was able to localized it at bedside with ultrasound and then palpate it better.  Right breast has no palpable masses, skin changes, or nipple changes.  Right axilla without any lymphadenopathy. MUSCULOSKELETAL:  Normal muscle strength and tone in all four extremities.  No peripheral edema or cyanosis. SKIN: Skin turgor is normal. There are no pathologic skin lesions.  NEUROLOGIC:  Motor and sensation is grossly normal.  Cranial nerves are grossly intact. PSYCH:  Alert and oriented to person, place and time. Affect is normal.  Laboratory Analysis: Left breast biopsy 03/06/22: DIAGNOSIS:  A.  BREAST, LEFT UPPER OUTER QUADRANT; STEREOTACTIC CORE NEEDLE BIOPSY:  - BENIGN MAMMARY PARENCHYMA WITH STROMAL FIBROSIS, FIBROADENOMATOID CHANGES, AND FOCAL FAT NECROSIS, WITH ASSOCIATED COARSE  DYSTROPHIC CALCIFICATIONS.  - NEGATIVE FOR ATYPICAL PROLIFERATIVE BREAST DISEASE.   Left axillary lymph node biopsy 03/06/22: DIAGNOSIS:  A. LYMPH NODE, LEFT AXILLARY; ULTRASOUND GUIDED CORE NEEDLE BIOPSY:  - MULTIPLE DISAGGREGATED FRAGMENTS OF LYMPH NODE, FAVOR BENIGN, WITH REACTIVE PARACORTICAL HYPERPLASIA.   Comment:  Biopsy cores display fragmented portions of lymph node with intact follicular architecture, and germinal centers comprised of a normal mix of small centrocytes, larger centroblasts, and tingible body  macrophages.  There is mild expansion of mantle/marginal zones, as well as interfollicular/paracortical regions.  No abnormal large lymphocyte population is identified.  There is no evidence of metastatic carcinoma.   Concurrent flow cytometric studies show no immunophenotypic evidence of monoclonal B cells or aberrant T cells. For further details, see scanned report in [facility].   Taken together, the findings are most suggestive of sampling of a benign lymph node with reactive hyperplasia. If clinical concern persists, excisional biopsy may provide a more comprehensive assessment of overall lymph node architecture.  Labs from 01/03/22: Na 138, K 4.4, Cl 101, CO2 26, BUN 12, Cr 0.93.  Total bili 0.3, AST 10, Alt 14, Alk Phos 113, albumin 4.3.    Imaging: Mammogram and U/S 02/12/22: FINDINGS: Diagnostic images of the LEFT breast demonstrate enlarged LEFT axillary lymph nodes. There is a dominant rounded LEFT axillary oval mass, likely a lymph node, measuring approximately 29 mm. Spot magnification views of the LEFT breast demonstrate a 9 mm group of coarse heterogeneous calcifications in the LEFT upper outer breast at middle depth. No additional suspicious findings are noted in the LEFT breast. No suspicious mass, distortion, or microcalcifications are identified to suggest presence of malignancy in the RIGHT breast.   Targeted ultrasound was performed of the LEFT axilla. In the LEFT  axillary tail, there is an oval hypoechoic mass with circumscribed margins. It measures 2.8 x 1.7 by 1.8 cm. It is not demonstrate a discrete fatty hilum. This corresponds to the dominant mass/favored lymph node noted on prior CT and mammogram.   Immediately adjacent is an additional similar-appearing oval hypoechoic mass which measures 10 x 7 by 6 mm. This does not demonstrate a normal echogenic hilum. There is an additional immediately adjacent lymph node which demonstrates cortical thickening of approximately 3-4 mm with a preserved echogenic hila.   IMPRESSION: 1. There is a 28 mm mass in the LEFT axilla which likely reflects an abnormal lymph node. Recommend ultrasound-guided biopsy for definitive characterization and to evaluate for underlying lymphoproliferative process. 2. Adjacent to the 28 mm mass, there are 2 additional abnormal appearing lymph nodes. Recommend management of these lymph nodes be based upon biopsy results. 3. A 9 mm group of coarse heterogeneous calcifications in the LEFT upper outer breast are indeterminate. They are unlikely to be related to LEFT axillary adenopathy. Recommend stereotactic guided biopsy for definitive characterization. 4. No mammographic evidence of malignancy in the RIGHT breast.  Assessment and Plan: This is a 66 y.o. female with left axillary lymphadenopathy.  --Discussed with the patient the results of her imaging studies and the two biopsies done.  The left breast biopsy was negative for atypia or malignancy.  The axillary lymph node biopsy also shows no evidence of malignancy and normal lymphocyte population.  However, both pathology and radiology recommend that given the findings, may be prudent to excise the lymph node for better evaluation.  I agree with the recommendations and discussed with the patient that I do not think these would be reactive since she has not had any recent infections or vaccinations.  She's in agreement and wants to proceed  with excision to be 100% sure of the diagnosis. --Reviewed with her the plan for an ultrasound guided excision of left axillary lymph node.  Since I was able to visualize the lymph node with biopsy clip using bedside ultrasound and on palpation, I do not think we need to further localize it with RF tag.  Since this is not cancer either, would not need to excise the calcifications in the left breast or do a sentinel node biopsy either.  Discussed the planned incision, risks of bleeding, infection, injury to surrounding structures, lymphedema, post-op activity restrictions, that this would be an outpatient surgery, and she's willing to proceed. --Will schedule her for 04/03/22.  I spent 55 minutes dedicated to the care of this patient on the date of this encounter to include pre-visit review of records, face-to-face time with the patient discussing diagnosis and management, and any post-visit coordination of care.   [employer]  Wynne Dust, MD [facility]

## 2022-03-26 NOTE — Patient Instructions (Addendum)
Our surgery scheduler Pamala Hurry will call you within 24-48 hours to get you scheduled. If you have not heard from her after 48 hours, please call our office. Have the blue sheet available when she calls to write down important information.  If you have any concerns or questions, please feel free to call our office.  You will need to avoid any strenuous lifting, pushing, or pulling on the affected side for 2 weeks after surgery.   Open Lymph Node Biopsy An open lymph node biopsy is a procedure to remove a lymph node so that it can be checked for disease. Lymph nodes are part of the body's disease-fighting system (immune system). The immune system protects the body from infections, germs, and diseases. An open lymph node biopsy may be done to: Look for germs or cancer cells in your lymph node. Find out why your lymph node is swollen. Find out more about a condition you have. Lymph nodes are found in many locations in the body. Biopsies are often done on lymph nodes in the head, neck, armpit, or groin. Tell a health care provider about: Any allergies you have. All medicines you are taking, including vitamins, herbs, eye drops, creams, and over-the-counter medicines. Any problems you or family members have had with anesthetic medicines. Any blood disorders you have. Any surgeries you have had. Any medical conditions you have or have had. Whether you are pregnant or may be pregnant. What are the risks? Generally, this is a safe procedure. However, problems may occur, including: Infection. Bleeding. Allergic reactions to medicines. Damage to surrounding structures or organs, such as a nerve. Scarring. What happens before the procedure? Medicines Ask your health care provider about: Changing or stopping your regular medicines. This is especially important if you are taking diabetes medicines or blood thinners. Taking medicines such as aspirin and ibuprofen. These medicines can thin your blood.  Do not take these medicines before the procedure unless your health care provider tells you to take them. Taking over-the-counter medicines, vitamins, herbs, and supplements. Surgery safety Ask your health care provider: How your surgery site will be marked. What steps will be taken to help prevent infection. These steps may include: Removing hair at the surgery site. Washing skin with a germ-killing soap. Receiving antibiotic medicine. General instructions Follow instructions from your health care provider about eating or drinking restrictions. You may have an exam or testing. You may have a blood or urine sample taken. If you will be going home right after the procedure, plan to have a responsible adult care for you for the time you are told. This is important. What happens during the procedure?  An IV will be inserted into one of your veins. You will be given one or more of the following: A medicine to help you relax (sedative). A medicine to numb the area (local anesthetic). An incision will be made in the area where your lymph node is located. Your lymph node will be removed. Your incision will be closed with stitches (sutures). An antibiotic ointment may be applied to your incision. A bandage (dressing) will be placed over your incision. The procedure may vary among health care providers and hospitals. What happens after the procedure? Your blood pressure, heart rate, breathing rate, and blood oxygen level will be monitored until you leave the hospital or clinic. Do not drive for 24 hours if you were given a sedative during your procedure. It is up to you to get the results of your procedure. Ask your  health care provider, or the department that is doing the procedure, when your results will be ready. Summary An open lymph node biopsy is a procedure to remove a lymph node so that it can be checked for disease. Generally, this is a safe procedure. However, problems may occur,  including bleeding, infection, allergic reaction to medicines, and damage to other structures or organs. During the procedure, an incision will be made in the area of the lymph node, the lymph node will be removed, and the incision will be closed with sutures. You will be monitored after the procedure. Do not drive for 24 hours if you were given a sedative during your procedure. This information is not intended to replace advice given to you by your health care provider. Make sure you discuss any questions you have with your health care provider. Document Revised: 05/24/2020 Document Reviewed: 05/24/2020 Elsevier Patient Education  [employer].

## 2022-03-27 ENCOUNTER — Telehealth: Payer: Self-pay | Admitting: Surgery

## 2022-03-27 NOTE — Telephone Encounter (Signed)
Patient has been advised of Pre-Admission date/time, and Surgery date.  Surgery Date: 04/03/22 Preadmission Testing Date: 04/01/22 (phone 1p-5p)  Patient has been made aware to call [phone], between 1-3:00pm the day before surgery, to find out what time to arrive for surgery.

## 2022-03-27 NOTE — Telephone Encounter (Signed)
Updated information regarding rescheduled surgery at patients request.    Patient has been advised of Pre-Admission date/time, and Surgery date.  Surgery Date: 04/10/22 Preadmission Testing Date: 04/01/22 (phone 1p-5p)  Patient has been made aware to call [phone], between 1-3:00pm the day before surgery, to find out what time to arrive for surgery.

## 2022-04-01 ENCOUNTER — Inpatient Hospital Stay
Admission: RE | Admit: 2022-04-01 | Discharge: 2022-04-01 | Disposition: A | Discharge status: Home / Self Care | Payer: Medicare Other | Source: Ambulatory Visit

## 2022-04-01 NOTE — Patient Instructions (Signed)
Your procedure is scheduled on: Thursday April 10, 2022. Report to Day Surgery inside [facility] 2nd floor, stop by admissions desk before getting on elevator. To find out your arrival time please call [phone] between 1PM - 3PM on Wednesday April 09, 2022.  Remember: Instructions that are not followed completely may result in serious medical risk,  up to and including death, or upon the discretion of your surgeon and anesthesiologist your  surgery may need to be rescheduled.     _X__ 1. Do not eat food after midnight the night before your procedure.                 No chewing gum or hard candies. You may drink clear liquids up to 2 hours                 before you are scheduled to arrive for your surgery- DO not drink clear                 liquids within 2 hours of the start of your surgery.                 Clear Liquids include:  water, apple juice without pulp, clear Gatorade, G2 or                  Gatorade Zero (avoid Red/Purple/Blue), Black Coffee or Tea (Do not add                 anything to coffee or tea).  __X__2.  On the morning of surgery brush your teeth with toothpaste and water, you                may rinse your mouth with mouthwash if you wish.  Do not swallow any toothpaste of mouthwash.     _X__ 3.  No Alcohol for 24 hours before or after surgery.   _X__ 4.  Do Not Smoke or use e-cigarettes For 24 Hours Prior to Your Surgery.                 Do not use any chewable tobacco products for at least 6 hours prior to                 Surgery.  _X__  5.  Do not use any recreational drugs (marijuana, cocaine, heroin, ecstasy, MDMA or other)                For at least one week prior to your surgery.  Combination of these drugs with anesthesia                May have life threatening results.  ____  6.  Bring all medications with you on the day of surgery if instructed.   __X__  7.  Notify your doctor if there is any change in your medical  condition      (cold, fever, infections).     Do not wear jewelry, make-up, hairpins, clips or nail polish. Do not wear lotions, powders, or perfumes. You may wear deodorant. Do not shave 48 hours prior to surgery. Men may shave face and neck. Do not bring valuables to the hospital.    [facility] is not responsible for any belongings or valuables.  Contacts, dentures or bridgework may not be worn into surgery. Leave your suitcase in the car. After surgery it may be brought to your room. For patients admitted to the hospital, discharge time is determined  by your treatment team.   Patients discharged the day of surgery will not be allowed to drive home.   Make arrangements for someone to be with you for the first 24 hours of your Same Day Discharge.   __X__ Take these medicines the morning of surgery with A SIP OF WATER:    1. ARIPiprazole (ABILIFY) 5 MG tablet  2. FLUoxetine (PROZAC) 40 MG capsule  3.   4.  5.  6.  ____ Fleet Enema (as directed)   __X__ Use CHG Soap (or wipes) as directed  ____ Use Benzoyl Peroxide Gel as instructed  ____ Use inhalers on the day of surgery  ____ Stop metformin 2 days prior to surgery    ____ Take 1/2 of usual insulin dose the night before surgery. No insulin the morning          of surgery.   ____ Call your PCP, cardiologist, or Pulmonologist if taking Coumadin/Plavix/aspirin and ask when to stop before your surgery.   __X__ One Week prior to surgery- Stop Anti-inflammatories such as Ibuprofen, Aleve, Advil, Motrin, meloxicam (MOBIC), diclofenac, etodolac, ketorolac, Toradol, Daypro, piroxicam, Goody's or BC powders. OK TO USE TYLENOL IF NEEDED   __X__ Stop supplements until after surgery.    ____ Bring C-Pap to the hospital.    If you have any questions regarding your pre-procedure instructions,  Please call Pre-admit Testing at [phone]

## 2022-04-02 ENCOUNTER — Encounter
Admission: RE | Admit: 2022-04-02 | Discharge: 2022-04-02 | Disposition: A | Discharge status: Home / Self Care | Payer: Medicare Other | Source: Ambulatory Visit | Attending: Surgery | Admitting: Surgery

## 2022-04-02 NOTE — Patient Instructions (Addendum)
Your procedure is scheduled on: Thursday April 10, 2022. Report to Day Surgery inside [facility] 2nd floor, stop by admissions desk before getting on elevator.  To find out your arrival time please call [phone] between 1PM - 3PM on Wednesday April 09, 2022.  Remember: Instructions that are not followed completely may result in serious medical risk,  up to and including death, or upon the discretion of your surgeon and anesthesiologist your  surgery may need to be rescheduled.     _X__ 1. Do not eat food after midnight the night before your procedure.                 No chewing gum or hard candies. You may drink clear liquids up to 2 hours                 before you are scheduled to arrive for your surgery- DO not drink clear                 liquids within 2 hours of the start of your surgery.                 Clear Liquids include:  water, apple juice without pulp, clear Gatorade, G2 or                  Gatorade Zero (avoid Red/Purple/Blue), Black Coffee or Tea (Do not add                 anything to coffee or tea).  __X__2.  On the morning of surgery brush your teeth with toothpaste and water, you                may rinse your mouth with mouthwash if you wish.  Do not swallow any toothpaste or mouthwash.     _X__ 3.  No Alcohol for 24 hours before or after surgery.   _X__ 4.  Do Not Smoke or use e-cigarettes For 24 Hours Prior to Your Surgery.                 Do not use any chewable tobacco products for at least 6 hours prior to                 Surgery.  _X__  5.  Do not use any recreational drugs (marijuana, cocaine, heroin, ecstasy, MDMA or other)                For at least one week prior to your surgery.  Combination of these drugs with anesthesia                May have life threatening results.  ____  6.  Bring all medications with you on the day of surgery if instructed.   __X__  7.  Notify your doctor if there is any change in your medical  condition      (cold, fever, infections).     Do not wear jewelry, make-up, hairpins, clips or nail polish. Do not wear lotions, powders, or perfumes. You may wear deodorant. Do not shave 48 hours prior to surgery. Men may shave face and neck. Do not bring valuables to the hospital.    [facility] is not responsible for any belongings or valuables.  Contacts, dentures or bridgework may not be worn into surgery. Leave your suitcase in the car. After surgery it may be brought to your room. For patients admitted to the hospital, discharge time is  determined by your treatment team.   Patients discharged the day of surgery will not be allowed to drive home.   Make arrangements for someone to be with you for the first 24 hours of your Same Day Discharge.   __X__ Take these medicines the morning of surgery with A SIP OF WATER:    1. ARIPiprazole (ABILIFY) 5 MG tablet  2. FLUoxetine (PROZAC) 40 MG capsule  3. atorvastatin (LIPITOR) 10 MG tablet  4.  5.  6.  ____ Fleet Enema (as directed)   __X__ Use CHG Soap (or wipes) as directed  ____ Use Benzoyl Peroxide Gel as instructed  ____ Use inhalers on the day of surgery  ____ Stop metformin 2 days prior to surgery    ____ Take 1/2 of usual insulin dose the night before surgery. No insulin the morning          of surgery.   ____ Call your PCP, cardiologist, or Pulmonologist if taking Coumadin/Plavix/aspirin and ask when to stop before your surgery.   __X__ One Week prior to surgery- Stop Anti-inflammatories such as Ibuprofen, Aleve, Advil, Motrin, meloxicam (MOBIC), diclofenac, etodolac, ketorolac, Toradol, Daypro, piroxicam, Goody's or BC powders. OK TO USE TYLENOL IF NEEDED   __X__ Stop supplements until after surgery.    ____ Bring C-Pap to the hospital.    If you have any questions regarding your pre-procedure instructions,  Please call Pre-admit Testing at [phone]

## 2022-04-04 ENCOUNTER — Encounter
Admission: RE | Admit: 2022-04-04 | Discharge: 2022-04-04 | Disposition: A | Discharge status: Home / Self Care | Payer: Medicare Other | Source: Ambulatory Visit | Attending: Surgery | Admitting: Surgery

## 2022-04-04 DIAGNOSIS — E669 Obesity, unspecified: Secondary | ICD-10-CM | POA: Diagnosis not present

## 2022-04-04 DIAGNOSIS — E785 Hyperlipidemia, unspecified: Secondary | ICD-10-CM | POA: Diagnosis not present

## 2022-04-04 DIAGNOSIS — Z01818 Encounter for other preprocedural examination: Secondary | ICD-10-CM | POA: Diagnosis not present

## 2022-04-04 DIAGNOSIS — Z01810 Encounter for preprocedural cardiovascular examination: Secondary | ICD-10-CM

## 2022-04-04 DIAGNOSIS — Z01812 Encounter for preprocedural laboratory examination: Secondary | ICD-10-CM

## 2022-04-04 LAB — CBC
HCT: 42.2 % (ref 36.0–46.0)
Hemoglobin: 13.2 g/dL (ref 12.0–15.0)
MCH: 26.5 pg (ref 26.0–34.0)
MCHC: 31.3 g/dL (ref 30.0–36.0)
MCV: 84.7 fL (ref 80.0–100.0)
Platelets: 298 10*3/uL (ref 150–400)
RBC: 4.98 MIL/uL (ref 3.87–5.11)
RDW: 15.6 % — ABNORMAL HIGH (ref 11.5–15.5)
WBC: 6.2 10*3/uL (ref 4.0–10.5)
nRBC: 0 % (ref 0.0–0.2)

## 2022-04-09 MED ORDER — BUPIVACAINE LIPOSOME 1.3 % IJ SUSP: 20.0000 mL | Freq: Once | INTRAMUSCULAR | Status: DC

## 2022-04-09 MED ORDER — ACETAMINOPHEN 500 MG PO TABS: 1000.0000 mg | ORAL_TABLET | ORAL | Status: AC

## 2022-04-09 MED ORDER — CEFAZOLIN SODIUM-DEXTROSE 2-4 GM/100ML-% IV SOLN
2.0000 g | INTRAVENOUS | Status: AC
  Administered 2022-04-10: 2 g via INTRAVENOUS

## 2022-04-09 MED ORDER — FAMOTIDINE 20 MG PO TABS: 20.0000 mg | ORAL_TABLET | Freq: Once | ORAL | Status: AC

## 2022-04-09 MED ORDER — GABAPENTIN 300 MG PO CAPS: 300.0000 mg | ORAL_CAPSULE | ORAL | Status: AC

## 2022-04-09 MED ORDER — LACTATED RINGERS IV SOLN: INTRAVENOUS | Status: DC

## 2022-04-09 MED ORDER — CHLORHEXIDINE GLUCONATE CLOTH 2 % EX PADS: 6.0000 | MEDICATED_PAD | Freq: Once | CUTANEOUS | Status: DC

## 2022-04-09 MED ORDER — CHLORHEXIDINE GLUCONATE 0.12 % MT SOLN: 15.0000 mL | Freq: Once | OROMUCOSAL | Status: AC

## 2022-04-09 MED ORDER — CHLORHEXIDINE GLUCONATE CLOTH 2 % EX PADS
6.0000 | MEDICATED_PAD | Freq: Once | CUTANEOUS | Status: AC
  Administered 2022-04-10: 6 via TOPICAL

## 2022-04-09 MED ORDER — ORAL CARE MOUTH RINSE: 15.0000 mL | Freq: Once | OROMUCOSAL | Status: AC

## 2022-04-10 ENCOUNTER — Ambulatory Visit
Admission: RE | Admit: 2022-04-10 | Discharge: 2022-04-10 | Disposition: A | Discharge status: Home / Self Care | Payer: Medicare Other | Source: Ambulatory Visit | Attending: Surgery | Admitting: Surgery

## 2022-04-10 ENCOUNTER — Encounter: Payer: Self-pay | Admitting: Surgery

## 2022-04-10 ENCOUNTER — Other Ambulatory Visit: Payer: Self-pay

## 2022-04-10 ENCOUNTER — Ambulatory Visit: Payer: Medicare Other | Admitting: Urgent Care

## 2022-04-10 ENCOUNTER — Encounter
Admission: RE | Disposition: A | Discharge status: Home / Self Care | Payer: Self-pay | Source: Ambulatory Visit | Attending: Surgery

## 2022-04-10 ENCOUNTER — Ambulatory Visit: Payer: Medicare Other

## 2022-04-10 DIAGNOSIS — F17210 Nicotine dependence, cigarettes, uncomplicated: Secondary | ICD-10-CM | POA: Insufficient documentation

## 2022-04-10 DIAGNOSIS — Z01810 Encounter for preprocedural cardiovascular examination: Secondary | ICD-10-CM

## 2022-04-10 DIAGNOSIS — Z6832 Body mass index (BMI) 32.0-32.9, adult: Secondary | ICD-10-CM | POA: Diagnosis not present

## 2022-04-10 DIAGNOSIS — F418 Other specified anxiety disorders: Secondary | ICD-10-CM | POA: Diagnosis not present

## 2022-04-10 DIAGNOSIS — E669 Obesity, unspecified: Secondary | ICD-10-CM | POA: Diagnosis not present

## 2022-04-10 DIAGNOSIS — Z01812 Encounter for preprocedural laboratory examination: Secondary | ICD-10-CM

## 2022-04-10 DIAGNOSIS — R590 Localized enlarged lymph nodes: Secondary | ICD-10-CM | POA: Diagnosis not present

## 2022-04-10 DIAGNOSIS — E785 Hyperlipidemia, unspecified: Secondary | ICD-10-CM

## 2022-04-10 HISTORY — PX: AXILLARY LYMPH NODE BIOPSY: SHX5737

## 2022-04-10 SURGERY — AXILLARY LYMPH NODE BIOPSY
Anesthesia: General | Laterality: Left

## 2022-04-10 MED ORDER — SUCCINYLCHOLINE CHLORIDE 200 MG/10ML IV SOSY
PREFILLED_SYRINGE | INTRAVENOUS | Status: DC | PRN
  Administered 2022-04-10: 80 mg via INTRAVENOUS

## 2022-04-10 MED ORDER — ONDANSETRON HCL 4 MG/2ML IJ SOLN: 4.0000 mg | Freq: Once | INTRAMUSCULAR | Status: DC | PRN

## 2022-04-10 MED ORDER — CEFAZOLIN SODIUM-DEXTROSE 2-4 GM/100ML-% IV SOLN
INTRAVENOUS | Status: AC
  Filled 2022-04-10: qty 100

## 2022-04-10 MED ORDER — ONDANSETRON HCL 4 MG/2ML IJ SOLN
INTRAMUSCULAR | Status: DC | PRN
  Administered 2022-04-10: 4 mg via INTRAVENOUS

## 2022-04-10 MED ORDER — PHENYLEPHRINE HCL-NACL 20-0.9 MG/250ML-% IV SOLN
INTRAVENOUS | Status: AC
  Filled 2022-04-10: qty 250

## 2022-04-10 MED ORDER — OXYCODONE HCL 5 MG PO TABS: 5.0000 mg | ORAL_TABLET | Freq: Once | ORAL | Status: DC | PRN

## 2022-04-10 MED ORDER — GABAPENTIN 300 MG PO CAPS
ORAL_CAPSULE | ORAL | Status: AC
  Administered 2022-04-10: 300 mg via ORAL
  Filled 2022-04-10: qty 1

## 2022-04-10 MED ORDER — LIDOCAINE HCL (CARDIAC) PF 100 MG/5ML IV SOSY
PREFILLED_SYRINGE | INTRAVENOUS | Status: DC | PRN
  Administered 2022-04-10: 100 mg via INTRAVENOUS

## 2022-04-10 MED ORDER — LACTATED RINGERS IV SOLN: INTRAVENOUS | Status: DC

## 2022-04-10 MED ORDER — LIDOCAINE HCL (PF) 2 % IJ SOLN
INTRAMUSCULAR | Status: AC
  Filled 2022-04-10: qty 5

## 2022-04-10 MED ORDER — PHENYLEPHRINE HCL (PRESSORS) 10 MG/ML IV SOLN
INTRAVENOUS | Status: DC | PRN
  Administered 2022-04-10 (×4): 80 ug via INTRAVENOUS

## 2022-04-10 MED ORDER — FENTANYL CITRATE (PF) 100 MCG/2ML IJ SOLN
INTRAMUSCULAR | Status: AC
  Filled 2022-04-10: qty 2

## 2022-04-10 MED ORDER — MIDAZOLAM HCL 2 MG/2ML IJ SOLN
INTRAMUSCULAR | Status: AC
  Filled 2022-04-10: qty 2

## 2022-04-10 MED ORDER — PHENYLEPHRINE 80 MCG/ML (10ML) SYRINGE FOR IV PUSH (FOR BLOOD PRESSURE SUPPORT)
PREFILLED_SYRINGE | INTRAVENOUS | Status: AC
  Filled 2022-04-10: qty 10

## 2022-04-10 MED ORDER — PROPOFOL 10 MG/ML IV BOLUS
INTRAVENOUS | Status: DC | PRN
  Administered 2022-04-10: 30 mg via INTRAVENOUS
  Administered 2022-04-10: 120 mg via INTRAVENOUS

## 2022-04-10 MED ORDER — OXYCODONE HCL 5 MG/5ML PO SOLN: 5.0000 mg | Freq: Once | ORAL | Status: DC | PRN

## 2022-04-10 MED ORDER — CHLORHEXIDINE GLUCONATE 0.12 % MT SOLN
OROMUCOSAL | Status: AC
  Administered 2022-04-10: 15 mL via OROMUCOSAL
  Filled 2022-04-10: qty 15

## 2022-04-10 MED ORDER — BUPIVACAINE-EPINEPHRINE 0.5% -1:200000 IJ SOLN
INTRAMUSCULAR | Status: DC | PRN
  Administered 2022-04-10: 30 mL

## 2022-04-10 MED ORDER — PROPOFOL 10 MG/ML IV BOLUS
INTRAVENOUS | Status: AC
  Filled 2022-04-10: qty 20

## 2022-04-10 MED ORDER — ONDANSETRON HCL 4 MG/2ML IJ SOLN
INTRAMUSCULAR | Status: AC
  Filled 2022-04-10: qty 2

## 2022-04-10 MED ORDER — BUPIVACAINE-EPINEPHRINE (PF) 0.5% -1:200000 IJ SOLN
INTRAMUSCULAR | Status: AC
  Filled 2022-04-10: qty 30

## 2022-04-10 MED ORDER — ACETAMINOPHEN 500 MG PO TABS
ORAL_TABLET | ORAL | Status: AC
  Administered 2022-04-10: 1000 mg via ORAL
  Filled 2022-04-10: qty 2

## 2022-04-10 MED ORDER — ACETAMINOPHEN 10 MG/ML IV SOLN: 1000.0000 mg | Freq: Once | INTRAVENOUS | Status: DC | PRN

## 2022-04-10 MED ORDER — KETOROLAC TROMETHAMINE 30 MG/ML IJ SOLN
INTRAMUSCULAR | Status: DC | PRN
  Administered 2022-04-10: 30 mg via INTRAVENOUS

## 2022-04-10 MED ORDER — FENTANYL CITRATE (PF) 100 MCG/2ML IJ SOLN: 25.0000 ug | INTRAMUSCULAR | Status: DC | PRN

## 2022-04-10 MED ORDER — KETOROLAC TROMETHAMINE 30 MG/ML IJ SOLN
INTRAMUSCULAR | Status: AC
Start: 2022-04-10 — End: ?
  Filled 2022-04-10: qty 1

## 2022-04-10 MED ORDER — FENTANYL CITRATE (PF) 100 MCG/2ML IJ SOLN
INTRAMUSCULAR | Status: DC | PRN
  Administered 2022-04-10: 25 ug via INTRAVENOUS
  Administered 2022-04-10: 50 ug via INTRAVENOUS
  Administered 2022-04-10: 25 ug via INTRAVENOUS

## 2022-04-10 MED ORDER — DEXAMETHASONE SODIUM PHOSPHATE 10 MG/ML IJ SOLN
INTRAMUSCULAR | Status: DC | PRN
  Administered 2022-04-10: 10 mg via INTRAVENOUS

## 2022-04-10 MED ORDER — ACETAMINOPHEN 500 MG PO TABS: 1000.0000 mg | ORAL_TABLET | Freq: Four times a day (QID) | ORAL | Status: DC | PRN

## 2022-04-10 MED ORDER — IBUPROFEN 600 MG PO TABS: 600.0000 mg | ORAL_TABLET | Freq: Three times a day (TID) | ORAL | 1 refills | Status: DC | PRN

## 2022-04-10 MED ORDER — FAMOTIDINE 20 MG PO TABS
ORAL_TABLET | ORAL | Status: AC
  Administered 2022-04-10: 20 mg via ORAL
  Filled 2022-04-10: qty 1

## 2022-04-10 MED ORDER — DEXAMETHASONE SODIUM PHOSPHATE 10 MG/ML IJ SOLN
INTRAMUSCULAR | Status: AC
  Filled 2022-04-10: qty 1

## 2022-04-10 MED ORDER — METHYLENE BLUE 1 % INJ SOLN
INTRAVENOUS | Status: AC
  Filled 2022-04-10: qty 10

## 2022-04-10 MED ORDER — OXYCODONE HCL 5 MG PO TABS: 5.0000 mg | ORAL_TABLET | ORAL | 0 refills | Status: DC | PRN

## 2022-04-10 MED ORDER — SUCCINYLCHOLINE CHLORIDE 200 MG/10ML IV SOSY
PREFILLED_SYRINGE | INTRAVENOUS | Status: AC
  Filled 2022-04-10: qty 10

## 2022-04-10 MED ORDER — MIDAZOLAM HCL 2 MG/2ML IJ SOLN
INTRAMUSCULAR | Status: DC | PRN
  Administered 2022-04-10: 2 mg via INTRAVENOUS

## 2022-04-10 SURGICAL SUPPLY — 49 items
APPLIER CLIP 11 MED OPEN (CLIP)
APPLIER CLIP 9.375 SM OPEN (CLIP)
BINDER BREAST LRG (GAUZE/BANDAGES/DRESSINGS) ×1 IMPLANT
BLADE SURG 15 STRL LF DISP TIS (BLADE) ×1 IMPLANT
BLADE SURG 15 STRL SS (BLADE) ×2
CHLORAPREP W/TINT 26 (MISCELLANEOUS) ×2 IMPLANT
CLIP APPLIE 11 MED OPEN (CLIP) IMPLANT
CLIP APPLIE 9.375 SM OPEN (CLIP) IMPLANT
CNTNR SPEC 2.5X3XGRAD LEK (MISCELLANEOUS)
CONT SPEC 4OZ STER OR WHT (MISCELLANEOUS)
CONTAINER SPEC 2.5X3XGRAD LEK (MISCELLANEOUS) ×1 IMPLANT
COVER PROBE GAMMA FINDER SLV (MISCELLANEOUS) ×2 IMPLANT
DERMABOND ADVANCED (GAUZE/BANDAGES/DRESSINGS) ×2
DERMABOND ADVANCED .7 DNX12 (GAUZE/BANDAGES/DRESSINGS) ×1 IMPLANT
DRAPE LAPAROTOMY TRNSV 106X77 (MISCELLANEOUS) ×2 IMPLANT
DRSG GAUZE FLUFF 36X18 (GAUZE/BANDAGES/DRESSINGS) ×1 IMPLANT
ELECT CAUTERY BLADE TIP 2.5 (TIP) ×2
ELECT REM PT RETURN 9FT ADLT (ELECTROSURGICAL) ×2
ELECTRODE CAUTERY BLDE TIP 2.5 (TIP) ×1 IMPLANT
ELECTRODE REM PT RTRN 9FT ADLT (ELECTROSURGICAL) ×1 IMPLANT
GAUZE 4X4 16PLY ~~LOC~~+RFID DBL (SPONGE) ×1 IMPLANT
GLOVE SURG SYN 7.0 (GLOVE) ×4 IMPLANT
GLOVE SURG SYN 7.0 PF PI (GLOVE) ×1 IMPLANT
GLOVE SURG SYN 7.5  E (GLOVE) ×4
GLOVE SURG SYN 7.5 E (GLOVE) ×2 IMPLANT
GLOVE SURG SYN 7.5 PF PI (GLOVE) ×1 IMPLANT
GOWN STRL REUS W/ TWL LRG LVL3 (GOWN DISPOSABLE) ×2 IMPLANT
GOWN STRL REUS W/TWL LRG LVL3 (GOWN DISPOSABLE) ×4
KIT TURNOVER KIT A (KITS) ×2 IMPLANT
LABEL OR SOLS (LABEL) ×2 IMPLANT
MANIFOLD NEPTUNE II (INSTRUMENTS) ×2 IMPLANT
NDL FILTER BLUNT 18X1 1/2 (NEEDLE) ×1 IMPLANT
NDL HYPO 25X1 1.5 SAFETY (NEEDLE) ×1 IMPLANT
NEEDLE FILTER BLUNT 18X 1/2SAF (NEEDLE) ×2
NEEDLE FILTER BLUNT 18X1 1/2 (NEEDLE) ×1 IMPLANT
NEEDLE HYPO 22GX1.5 SAFETY (NEEDLE) ×2 IMPLANT
NEEDLE HYPO 25X1 1.5 SAFETY (NEEDLE) ×2 IMPLANT
PACK BASIN MINOR ARMC (MISCELLANEOUS) ×2 IMPLANT
SUT MNCRL 4-0 (SUTURE) ×2
SUT MNCRL 4-0 27XMFL (SUTURE) ×1
SUT VIC AB 3-0 SH 27 (SUTURE) ×2
SUT VIC AB 3-0 SH 27X BRD (SUTURE) ×1 IMPLANT
SUTURE MNCRL 4-0 27XMF (SUTURE) ×1 IMPLANT
SYR 10ML LL (SYRINGE) ×4 IMPLANT
SYR BULB IRRIG 60ML STRL (SYRINGE) ×1 IMPLANT
TAPE TRANSPORE STRL 2 31045 (GAUZE/BANDAGES/DRESSINGS) IMPLANT
TRAP FLUID SMOKE EVACUATOR (MISCELLANEOUS) ×2 IMPLANT
WATER STERILE IRR 1000ML POUR (IV SOLUTION) ×2 IMPLANT
WATER STERILE IRR 500ML POUR (IV SOLUTION) ×2 IMPLANT

## 2022-04-10 NOTE — Anesthesia Preprocedure Evaluation (Addendum)
Anesthesia Evaluation  Patient identified by MRN, date of birth, ID band Patient awake    Reviewed: Allergy & Precautions, NPO status , Patient's Chart, lab work & pertinent test results  History of Anesthesia Complications Negative for: history of anesthetic complications  Airway Mallampati: III   Neck ROM: Full    Dental  (+) Upper Dentures, Lower Dentures   Pulmonary Current Smoker (1/2 ppd)Patient did not abstain from smoking.,    Pulmonary exam normal breath sounds clear to auscultation       Cardiovascular Exercise Tolerance: Good negative cardio ROS Normal cardiovascular exam Rhythm:Regular Rate:Normal  ECG 04/04/22: normal   Neuro/Psych PSYCHIATRIC DISORDERS Anxiety Depression Vertigo     GI/Hepatic negative GI ROS,   Endo/Other  Obesity   Renal/GU negative Renal ROS     Musculoskeletal   Abdominal   Peds  Hematology negative hematology ROS (+)   Anesthesia Other Findings   Reproductive/Obstetrics                            Anesthesia Physical Anesthesia Plan  ASA: 2  Anesthesia Plan: General   Post-op Pain Management:    Induction: Intravenous  PONV Risk Score and Plan: 2 and Ondansetron, Dexamethasone and Treatment may vary due to age or medical condition  Airway Management Planned: Oral ETT  Additional Equipment:   Intra-op Plan:   Post-operative Plan: Extubation in OR  Informed Consent: I have reviewed the patients History and Physical, chart, labs and discussed the procedure including the risks, benefits and alternatives for the proposed anesthesia with the patient or authorized representative who has indicated his/her understanding and acceptance.     Dental advisory given  Plan Discussed with: CRNA  Anesthesia Plan Comments: (Patient consented for risks of anesthesia including but not limited to:  - adverse reactions to medications - damage to eyes,  teeth, lips or other oral mucosa - nerve damage due to positioning  - sore throat or hoarseness - damage to heart, brain, nerves, lungs, other parts of body or loss of life  Informed patient about role of CRNA in peri- and intra-operative care.  Patient voiced understanding.)        Anesthesia Quick Evaluation

## 2022-04-10 NOTE — Progress Notes (Signed)
Teeth returned to patient

## 2022-04-10 NOTE — Op Note (Signed)
  Procedure Date:  04/10/2022  Pre-operative Diagnosis:  Enlarged left axillary lymph node  Post-operative Diagnosis: Enlarged left axillary lymph node  Procedure:  Ultrasound-guided left axillary lymph node excisional biopsy.  Surgeon:  Melvyn Neth, MD  Anesthesia:  General endotracheal  Estimated Blood Loss:  10 ml  Specimens:  Left axillary lymph node  Complications:  None  Indications for Procedure:  This is a 66 y.o. female who presents with an enlarged left axillary lymph node.  She had a percutaneous biopsy on 03/06/22, which did not show any atypia or malignancy and was also negative for lymphoproliferative disorders.  However, due to the abnormal size, it was recommended to excise it.  The risks of bleeding, infection, injury to surrounding structures, hematoma, seroma, open wound, lymphedema, cosmetic deformity, and the need for further surgery were all discussed with the patient and was willing to proceed.  Description of Procedure: The patient was correctly identified in the preoperative area and brought into the operating room.  The patient was placed supine with VTE prophylaxis in place.  Appropriate time-outs were performed.  Anesthesia was induced and the patient was intubated.  Appropriate antibiotics were infused.  Ultrasound was used at bedside to identify the enlarged lymph node and the biopsy clip.  The left chest and axilla were prepped and draped in usual sterile fashion.  Then using ultrasound again, a 5 cm incision was made in the axilla at the site of the lymph node.  Cautery was used to dissect down the subcutaneous tissue and the enlarged lymph node was identified.  It was palpable.  It was dissected free carefully to not injure it.  Hemostasis was achieved with cautery.  Once removed, the lymph node was imaged and it was confirmed the clip was inside the specimen. The cavity was irrigated and hemostasis was assured with electrocautery.  Local anesthetic was  infiltrated into the skin and subcutaneous tissue of the cavity.  The wound was then closed in three layers with 2-0 Vicryl, 3-0 Vicryl and 4-0 Monocryl and sealed with DermaBond.  The patient was emerged from anesthesia and extubated and brought to the recovery room for further management.  The patient tolerated the procedure well and all counts were correct at the end of the case.   Melvyn Neth, MD

## 2022-04-10 NOTE — Transfer of Care (Signed)
Immediate Anesthesia Transfer of Care Note  Patient: Heather Hill  Procedure(s) Performed: AXILLARY LYMPH NODE BIOPSY, excisional (Left)  Patient Location: PACU  Anesthesia Type:General  Level of Consciousness: awake, drowsy and patient cooperative  Airway & Oxygen Therapy: Patient Spontanous Breathing and Patient connected to face mask oxygen  Post-op Assessment: Report given to RN and Post -op Vital signs reviewed and stable  Post vital signs: Reviewed and stable  Last Vitals:  Vitals Value Taken Time  BP 107/72 04/10/22 0910  Temp    Pulse    Resp    SpO2    Vitals shown include unvalidated device data.  Last Pain:  Vitals:   04/10/22 0614  TempSrc: Temporal  PainSc: 0-No pain         Complications: No notable events documented.

## 2022-04-10 NOTE — Interval H&P Note (Signed)
History and Physical Interval Note:  04/10/2022 7:13 AM  Heather Hill  has presented today for surgery, with the diagnosis of left axillary lymphadenopathy.  The various methods of treatment have been discussed with the patient and family. After consideration of risks, benefits and other options for treatment, the patient has consented to  Procedure(s): AXILLARY LYMPH NODE BIOPSY, excisional (Left) as a surgical intervention.  The patient's history has been reviewed, patient examined, no change in status, stable for surgery.  I have reviewed the patient's chart and labs.  Questions were answered to the patient's satisfaction.     [name]

## 2022-04-10 NOTE — Anesthesia Postprocedure Evaluation (Signed)
Anesthesia Post Note  Patient: Heather Hill  Procedure(s) Performed: AXILLARY LYMPH NODE BIOPSY, excisional (Left)  Patient location during evaluation: PACU Anesthesia Type: General Level of consciousness: awake and alert Pain management: pain level controlled Vital Signs Assessment: post-procedure vital signs reviewed and stable Respiratory status: spontaneous breathing, nonlabored ventilation, respiratory function stable and patient connected to nasal cannula oxygen Cardiovascular status: blood pressure returned to baseline and stable Postop Assessment: no apparent nausea or vomiting Anesthetic complications: no   No notable events documented.   Last Vitals:  Vitals:   04/10/22 0945 04/10/22 1000  BP:  119/80  Pulse: 70 80  Resp: 15 16  Temp: (!) 36.1 C (!) 36.2 C  SpO2: 96% 96%    Last Pain:  Vitals:   04/10/22 1000  TempSrc: Temporal  PainSc: 0-No pain                 Precious Haws Arsenio Schnorr

## 2022-04-10 NOTE — Anesthesia Procedure Notes (Signed)
Procedure Name: Intubation Date/Time: 04/10/2022 7:45 AM  Performed by: Debe Coder, CRNAPre-anesthesia Checklist: Patient identified, Emergency Drugs available, Suction available and Patient being monitored Patient Re-evaluated:Patient Re-evaluated prior to induction Oxygen Delivery Method: Circle system utilized Preoxygenation: Pre-oxygenation with 100% oxygen Induction Type: IV induction Ventilation: Mask ventilation without difficulty Laryngoscope Size: McGraph and 3 Grade View: Grade I Tube type: Oral Tube size: 7.0 mm Number of attempts: 1 Airway Equipment and Method: Stylet and Oral airway Placement Confirmation: ETT inserted through vocal cords under direct vision, positive ETCO2 and breath sounds checked- equal and bilateral Secured at: 19 cm Tube secured with: Tape Dental Injury: Teeth and Oropharynx as per pre-operative assessment

## 2022-04-10 NOTE — Discharge Instructions (Signed)
AMBULATORY SURGERY  ?DISCHARGE INSTRUCTIONS ? ? ?The drugs that you were given will stay in your system until tomorrow so for the next 24 hours you should not: ? ?Drive an automobile ?Make any legal decisions ?Drink any alcoholic beverage ? ? ?You may resume regular meals tomorrow.  Today it is better to start with liquids and gradually work up to solid foods. ? ?You may eat anything you prefer, but it is better to start with liquids, then soup and crackers, and gradually work up to solid foods. ? ? ?Please notify your doctor immediately if you have any unusual bleeding, trouble breathing, redness and pain at the surgery site, drainage, fever, or pain not relieved by medication. ? ? ? ?Additional Instructions: ? ? ? ?Please contact your physician with any problems or Same Day Surgery at [phone], Monday through Friday 6 am to 4 pm, or [place] [facility] Main number at [phone].  ?

## 2022-04-15 LAB — SURGICAL PATHOLOGY

## 2022-04-15 NOTE — Progress Notes (Signed)
04/15/22  Path results reviewed.  Excised lymph node is negative for malignancy, and appears to be a reactive lymph node.  Tried calling patient x 3, but voicemail is full and not able to leave a message.  Olean Ree, MD

## 2022-04-16 ENCOUNTER — Telehealth: Payer: Self-pay

## 2022-04-16 NOTE — Telephone Encounter (Signed)
Left message for patient to return call to office regarding pathology results. Dr.Piscoya would like to schedule follow up visit and discuss pathology results.

## 2022-04-23 ENCOUNTER — Encounter: Payer: Medicare Other | Admitting: Surgery

## 2022-04-30 ENCOUNTER — Encounter: Payer: Medicare Other | Admitting: Surgery

## 2022-05-09 ENCOUNTER — Other Ambulatory Visit: Payer: Self-pay

## 2022-05-09 ENCOUNTER — Encounter: Payer: Self-pay | Admitting: Surgery

## 2022-05-09 ENCOUNTER — Ambulatory Visit (INDEPENDENT_AMBULATORY_CARE_PROVIDER_SITE_OTHER): Payer: Medicare PPO | Admitting: Surgery

## 2022-05-09 VITALS — BP 135/88 | HR 69 | Temp 98.0°F | Ht 64.0 in | Wt 194.0 lb

## 2022-05-09 DIAGNOSIS — R590 Localized enlarged lymph nodes: Secondary | ICD-10-CM

## 2022-05-09 DIAGNOSIS — Z09 Encounter for follow-up examination after completed treatment for conditions other than malignant neoplasm: Secondary | ICD-10-CM

## 2022-05-09 NOTE — Patient Instructions (Signed)
Please call the office with any questions or concerns.

## 2022-05-09 NOTE — Progress Notes (Signed)
05/09/2022  HPI: Heather Hill is a 66 y.o. female s/p left axilla lymph node excision on 04/10/2022.  Patient presents today for follow-up.  She reports that she is doing very well and denies any significant pain or swelling in the area.  No complaints with the incision.  Vital signs: BP 135/88   Pulse 69   Temp 98 F (36.7 C) (Oral)   Ht '5\' 4"'$  (1.626 m)   Wt 194 lb (88 kg)   SpO2 96%   BMI 33.30 kg/m    Physical Exam: Constitutional: No acute distress Skin: Left axillary incision is healing well and is clean, dry, intact without any significant firmness or swelling.  No evidence of infection.  Assessment/Plan: This is a 66 y.o. female s/p left axilla lymph node excision.  - Discussed with the patient the pathology results showing a benign lymph node which is likely reactive from a previous infection.  However there is no evidence of malignancy or any atypical changes. - Patient is healing well and no complications with her incision. - Follow-up as needed.   Melvyn Neth, [facility]

## 2022-05-14 DIAGNOSIS — F331 Major depressive disorder, recurrent, moderate: Secondary | ICD-10-CM | POA: Diagnosis not present

## 2022-05-14 DIAGNOSIS — F410 Panic disorder [episodic paroxysmal anxiety] without agoraphobia: Secondary | ICD-10-CM | POA: Diagnosis not present

## 2022-06-02 ENCOUNTER — Telehealth: Payer: Self-pay | Admitting: Nurse Practitioner

## 2022-06-02 NOTE — Telephone Encounter (Signed)
Copied from [place] [id number]. Topic: Medicare AWV >> Jun 02, 2022  3:43 PM Josephina Gip wrote: Reason for CRM: N/A unable to leave a message for patient to call back and schedule Medicare Annual Wellness Visit (AWV) to be done virtually or by telephone.  No hx of AWV eligible as of 04/25/22  Please schedule at anytime with [facility]-Nurse Health Advisor.      73 Minutes appointment   Any questions, please call me at [phone]

## 2022-06-03 ENCOUNTER — Telehealth: Payer: Self-pay | Admitting: Nurse Practitioner

## 2022-06-03 NOTE — Telephone Encounter (Signed)
Pt scheduled for [place]

## 2022-06-03 NOTE — Telephone Encounter (Signed)
Patient giving a call back. Patient was contacted by Nurse health adviser, PJ  to schedule Medicare AWV. Contacted PJ, no answer. Unable to schedule AWV, please advise patient.

## 2022-06-11 ENCOUNTER — Ambulatory Visit (INDEPENDENT_AMBULATORY_CARE_PROVIDER_SITE_OTHER): Payer: Medicare PPO | Admitting: *Deleted

## 2022-06-11 DIAGNOSIS — Z0000 Encounter for general adult medical examination without abnormal findings: Secondary | ICD-10-CM

## 2022-06-11 NOTE — Patient Instructions (Signed)
Ms. Heather Hill , Thank you for taking time to come for your Medicare Wellness Visit. I appreciate your ongoing commitment to your health goals. Please review the following plan we discussed and let me know if I can assist you in the future.   These are the goals we discussed:  Goals      Increase physical activity        This is a list of the screening recommended for you and due dates:  Health Maintenance  Topic Date Due   COVID-19 Vaccine (5 - Moderna risk series) 08/21/2021   Flu Shot  03/25/2022   Zoster (Shingles) Vaccine (1 of 2) 09/11/2022*   Mammogram  02/13/2024   Colon Cancer Screening  02/01/2026   Tetanus Vaccine  01/18/2029   Pneumonia Vaccine  Completed   DEXA scan (bone density measurement)  Completed   Hepatitis C Screening: USPSTF Recommendation to screen - Ages 63-79 yo.  Completed   HPV Vaccine  Aged Out  *Topic was postponed. The date shown is not the original due date.    Advanced directives: Education provided  Conditions/risks identified:   Next appointment: Follow up in one year for your annual wellness visit   07-09-2022 @ 10:00  [place] 65 Years and Older, Female Preventive care refers to lifestyle choices and visits with your health care provider that can promote health and wellness. What does preventive care include? A yearly physical exam. This is also called an annual well check. Dental exams once or twice a year. Routine eye exams. Ask your health care provider how often you should have your eyes checked. Personal lifestyle choices, including: Daily care of your teeth and gums. Regular physical activity. Eating a healthy diet. Avoiding tobacco and drug use. Limiting alcohol use. Practicing safe sex. Taking low-dose aspirin every day. Taking vitamin and mineral supplements as recommended by your health care provider. What happens during an annual well check? The services and screenings done by your health care provider  during your annual well check will depend on your age, overall health, lifestyle risk factors, and family history of disease. Counseling  Your health care provider may ask you questions about your: Alcohol use. Tobacco use. Drug use. Emotional well-being. Home and relationship well-being. Sexual activity. Eating habits. History of falls. Memory and ability to understand (cognition). Work and work Statistician. Reproductive health. Screening  You may have the following tests or measurements: Height, weight, and BMI. Blood pressure. Lipid and cholesterol levels. These may be checked every 5 years, or more frequently if you are over 26 years old. Skin check. Lung cancer screening. You may have this screening every year starting at age 45 if you have a 30-pack-year history of smoking and currently smoke or have quit within the past 15 years. Fecal occult blood test (FOBT) of the stool. You may have this test every year starting at age 22. Flexible sigmoidoscopy or colonoscopy. You may have a sigmoidoscopy every 5 years or a colonoscopy every 10 years starting at age 15. Hepatitis C blood test. Hepatitis B blood test. Sexually transmitted disease (STD) testing. Diabetes screening. This is done by checking your blood sugar (glucose) after you have not eaten for a while (fasting). You may have this done every 1-3 years. Bone density scan. This is done to screen for osteoporosis. You may have this done starting at age 31. Mammogram. This may be done every 1-2 years. Talk to your health care provider about how often you should have  regular mammograms. Talk with your health care provider about your test results, treatment options, and if necessary, the need for more tests. Vaccines  Your health care provider may recommend certain vaccines, such as: Influenza vaccine. This is recommended every year. Tetanus, diphtheria, and acellular pertussis (Tdap, Td) vaccine. You may need a Td booster every  10 years. Zoster vaccine. You may need this after age 31. Pneumococcal 13-valent conjugate (PCV13) vaccine. One dose is recommended after age 35. Pneumococcal polysaccharide (PPSV23) vaccine. One dose is recommended after age 29. Talk to your health care provider about which screenings and vaccines you need and how often you need them. This information is not intended to replace advice given to you by your health care provider. Make sure you discuss any questions you have with your health care provider. Document Released: 09/07/2015 Document Revised: 04/30/2016 Document Reviewed: 06/12/2015 Elsevier Interactive Patient Education  2017 [employer] Prevention in the Home Falls can cause injuries. They can happen to people of all ages. There are many things you can do to make your home safe and to help prevent falls. What can I do on the outside of my home? Regularly fix the edges of walkways and driveways and fix any cracks. Remove anything that might make you trip as you walk through a door, such as a raised step or threshold. Trim any bushes or trees on the path to your home. Use bright outdoor lighting. Clear any walking paths of anything that might make someone trip, such as rocks or tools. Regularly check to see if handrails are loose or broken. Make sure that both sides of any steps have handrails. Any raised decks and porches should have guardrails on the edges. Have any leaves, snow, or ice cleared regularly. Use sand or salt on walking paths during winter. Clean up any spills in your garage right away. This includes oil or grease spills. What can I do in the bathroom? Use night lights. Install grab bars by the toilet and in the tub and shower. Do not use towel bars as grab bars. Use non-skid mats or decals in the tub or shower. If you need to sit down in the shower, use a plastic, non-slip stool. Keep the floor dry. Clean up any water that spills on the floor as soon as it  happens. Remove soap buildup in the tub or shower regularly. Attach bath mats securely with double-sided non-slip rug tape. Do not have throw rugs and other things on the floor that can make you trip. What can I do in the bedroom? Use night lights. Make sure that you have a light by your bed that is easy to reach. Do not use any sheets or blankets that are too big for your bed. They should not hang down onto the floor. Have a firm chair that has side arms. You can use this for support while you get dressed. Do not have throw rugs and other things on the floor that can make you trip. What can I do in the kitchen? Clean up any spills right away. Avoid walking on wet floors. Keep items that you use a lot in easy-to-reach places. If you need to reach something above you, use a strong step stool that has a grab bar. Keep electrical cords out of the way. Do not use floor polish or wax that makes floors slippery. If you must use wax, use non-skid floor wax. Do not have throw rugs and other things on the floor that  can make you trip. What can I do with my stairs? Do not leave any items on the stairs. Make sure that there are handrails on both sides of the stairs and use them. Fix handrails that are broken or loose. Make sure that handrails are as long as the stairways. Check any carpeting to make sure that it is firmly attached to the stairs. Fix any carpet that is loose or worn. Avoid having throw rugs at the top or bottom of the stairs. If you do have throw rugs, attach them to the floor with carpet tape. Make sure that you have a light switch at the top of the stairs and the bottom of the stairs. If you do not have them, ask someone to add them for you. What else can I do to help prevent falls? Wear shoes that: Do not have high heels. Have rubber bottoms. Are comfortable and fit you well. Are closed at the toe. Do not wear sandals. If you use a stepladder: Make sure that it is fully opened.  Do not climb a closed stepladder. Make sure that both sides of the stepladder are locked into place. Ask someone to hold it for you, if possible. Clearly mark and make sure that you can see: Any grab bars or handrails. First and last steps. Where the edge of each step is. Use tools that help you move around (mobility aids) if they are needed. These include: Canes. Walkers. Scooters. Crutches. Turn on the lights when you go into a dark area. Replace any light bulbs as soon as they burn out. Set up your furniture so you have a clear path. Avoid moving your furniture around. If any of your floors are uneven, fix them. If there are any pets around you, be aware of where they are. Review your medicines with your doctor. Some medicines can make you feel dizzy. This can increase your chance of falling. Ask your doctor what other things that you can do to help prevent falls. This information is not intended to replace advice given to you by your health care provider. Make sure you discuss any questions you have with your health care provider. Document Released: 06/07/2009 Document Revised: 01/17/2016 Document Reviewed: 09/15/2014 Elsevier Interactive Patient Education  2017 [employer].

## 2022-06-11 NOTE — Progress Notes (Signed)
Subjective:   Heather Hill is a 66 y.o. female who presents for Medicare Annual (Subsequent) preventive examination.  I connected with  Heather Hill on 06/11/22 by a  telephone enabled telemedicine application and verified that I am speaking with the correct person using two identifiers.   I discussed the limitations of evaluation and management by telemedicine. The patient expressed understanding and agreed to proceed.  Patient location: home  Provider location: Tele-health-home    Review of Systems     Cardiac Risk Factors include: advanced age (>25mn, >>4women);obesity (BMI >30kg/m2);smoking/ tobacco exposure     Objective:    Today's Vitals   There is no height or weight on file to calculate BMI.     06/11/2022    9:34 AM 04/10/2022    6:15 AM 04/02/2022    3:47 PM 02/02/2019    7:25 AM  Advanced Directives  Does Patient Have a Medical Advance Directive? No No No No  Would patient like information on creating a medical advance directive? No - Patient declined   No - Patient declined    Current Medications (verified) Outpatient Encounter Medications as of 06/11/2022  Medication Sig   acetaminophen (TYLENOL) 500 MG tablet Take 2 tablets (1,000 mg total) by mouth every 6 (six) hours as needed for mild pain.   ARIPiprazole (ABILIFY) 5 MG tablet Take 5 mg by mouth daily.    atorvastatin (LIPITOR) 10 MG tablet TAKE 1 TABLET(10 MG) BY MOUTH DAILY   Cyanocobalamin (VITAMIN B-12 ER PO) Take 1,200 mcg by mouth.   FLUoxetine (PROZAC) 20 MG capsule Take 20 mg by mouth daily.   FLUoxetine (PROZAC) 40 MG capsule Take 40 mg by mouth 2 (two) times daily.   ibuprofen (ADVIL) 600 MG tablet Take 1 tablet (600 mg total) by mouth every 8 (eight) hours as needed for moderate pain.   LORazepam (ATIVAN) 1 MG tablet Take 1 mg by mouth every 8 (eight) hours as needed.    traZODone (DESYREL) 100 MG tablet Take 100 mg by mouth at bedtime.   No facility-administered encounter  medications on file as of 06/11/2022.    Allergies (verified) Penicillins and Peanut-containing drug products   History: Past Medical History:  Diagnosis Date   Anxiety    Depression    Vertigo    none for over 8 yrs   Wears dentures    partial upper   Past Surgical History:  Procedure Laterality Date   AXILLARY LYMPH NODE BIOPSY Left 04/10/2022   Procedure: AXILLARY LYMPH NODE BIOPSY, excisional;  Surgeon: POlean Ree MD;  Location: [facility];  Service: General;  Laterality: Left;   BREAST BIOPSY Left 03/06/2022   stereo bx, calcs, "COIL" clip-path pending   COLONOSCOPY WITH PROPOFOL N/A 02/02/2019   Procedure: COLONOSCOPY WITH PROPOFOL;  Surgeon: VLin Landsman MD;  Location: [facility]  Service: Endoscopy;  Laterality: N/A;   POLYPECTOMY  02/02/2019   Procedure: POLYPECTOMY;  Surgeon: VLin Landsman MD;  Location: [facility]  Service: Endoscopy;;   TOTAL ABDOMINAL HYSTERECTOMY     TUBAL LIGATION     Family History  Problem Relation Age of Onset   Diabetes Mother    Alzheimer's disease Mother    Hypertension Father    Asthma Father    Diabetes Father    Bipolar disorder Sister    Diabetes Sister    Breast cancer Maternal Aunt    Diabetes Maternal Grandfather    Alzheimer's disease Maternal Grandfather  Heart disease Maternal Grandfather    Asthma Paternal Grandfather    Diabetes Paternal Grandfather    Hypertension Paternal Grandfather    Diabetes Brother    Social History   Socioeconomic History   Marital status: Married    Spouse name: Not on file   Number of children: Not on file   Years of education: Not on file   Highest education level: Not on file  Occupational History   Occupation: retired  Tobacco Use   Smoking status: Every Day    Packs/day: 0.50    Years: 48.00    Total pack years: 24.00    Types: Cigarettes   Smokeless tobacco: Never   Tobacco comments:    she currently uses a patch  Vaping Use    Vaping Use: Former  Substance and Sexual Activity   Alcohol use: Yes    Alcohol/week: 2.0 standard drinks of alcohol    Types: 2 Glasses of wine per week   Drug use: Never   Sexual activity: Not Currently  Other Topics Concern   Not on file  Social History Narrative   Not on file   Social Determinants of Health   Financial Resource Strain: Low Risk  (06/11/2022)   Overall Financial Resource Strain (CARDIA)    Difficulty of Paying Living Expenses: Not hard at all  Food Insecurity: No Food Insecurity (06/11/2022)   Hunger Vital Sign    Worried About Running Out of Food in the Last Year: Never true    Ran Out of Food in the Last Year: Never true  Transportation Needs: No Transportation Needs (06/11/2022)   PRAPARE - Hydrologist (Medical): No    Lack of Transportation (Non-Medical): No  Physical Activity: Inactive (06/11/2022)   Exercise Vital Sign    Days of Exercise per Week: 0 days    Minutes of Exercise per Session: 0 min  Stress: No Stress Concern Present (06/11/2022)   [place]    Feeling of Stress : Not at all  Social Connections: Moderately Isolated (06/11/2022)   Social Connection and Isolation Panel [NHANES]    Frequency of Communication with Friends and Family: Once a week    Frequency of Social Gatherings with Friends and Family: Once a week    Attends Religious Services: 1 to 4 times per year    Active Member of [employer] or Organizations: No    Attends Music therapist: Never    Marital Status: Married    Tobacco Counseling Ready to quit: Not Answered Counseling given: Not Answered Tobacco comments: she currently uses a patch   Clinical Intake:  Pre-visit preparation completed: Yes  Pain : No/denies pain     Diabetes: No  How often do you need to have someone help you when you read instructions, pamphlets, or other written materials from your  doctor or pharmacy?: 1 - Never  Diabetic?  no  Interpreter Needed?: No  Information entered by :: Leroy Kennedy LPN   Activities of Daily Living    06/11/2022    9:48 AM 04/02/2022    3:48 PM  In your present state of health, do you have any difficulty performing the following activities:  Hearing? 0   Vision? 0   Difficulty concentrating or making decisions? 0   Walking or climbing stairs? 1   Dressing or bathing? 0   Doing errands, shopping? 0 0  Preparing Food and eating ? N  Using the Toilet? N   In the past six months, have you accidently leaked urine? N   Do you have problems with loss of bowel control? N   Managing your Medications? N   Managing your Finances? N   Housekeeping or managing your Housekeeping? N     Patient Care Team: Venita Lick, NP as PCP - General (Nurse Practitioner)  Indicate any recent Medical Services you may have received from other than [facility] providers in the past year (date may be approximate).     Assessment:   This is a routine wellness examination for Jodye.  Hearing/Vision screen Hearing Screening - Comments:: No trouble hearing Vision Screening - Comments:: Not up to date  Dietary issues and exercise activities discussed: Current Exercise Habits: The patient does not participate in regular exercise at present   Goals Addressed             This Visit's Progress    Increase physical activity         Depression Screen    06/11/2022    9:42 AM 06/11/2022    9:41 AM 01/03/2022   11:01 AM 07/01/2021    4:06 PM 11/09/2019    3:22 PM 08/05/2019    9:26 AM 01/19/2019    4:45 PM  PHQ 2/9 Scores  PHQ - 2 Score 0 0 '5 5 4 5 6  '$ PHQ- 9 Score 0  '18 19 20 15 19    '$ Fall Risk    06/11/2022    9:34 AM 05/09/2022   10:53 AM 01/03/2022   11:00 AM 01/19/2019   10:10 AM  Fall Risk   Falls in the past year? 0 0 0 0  Number falls in past yr: 0  0   Injury with Fall? 0  0   Risk for fall due to : Impaired balance/gait  No Fall  Risks   Follow up Falls evaluation completed;Education provided;Falls prevention discussed  Falls evaluation completed Falls evaluation completed    FALL RISK PREVENTION PERTAINING TO THE HOME:  Any stairs in or around the home? Yes  If so, are there any without handrails? No  Home free of loose throw rugs in walkways, pet beds, electrical cords, etc? Yes  Adequate lighting in your home to reduce risk of falls? Yes   ASSISTIVE DEVICES UTILIZED TO PREVENT FALLS:  Life alert? No  Use of a cane, walker or w/c? No  Grab bars in the bathroom? No  Shower chair or bench in shower? No  Elevated toilet seat or a handicapped toilet? No   TIMED UP AND GO:  Was the test performed? No .    Cognitive Function:        06/11/2022    9:35 AM  6CIT Screen  What Year? 0 points  What month? 0 points  What time? 0 points  Count back from 20 0 points  Months in reverse 4 points  Repeat phrase 0 points  Total Score 4 points    Immunizations Immunization History  Administered Date(s) Administered   Influenza,inj,Quad PF,6+ Mos 06/10/2019   Influenza-Unspecified 06/17/2021   Moderna Sars-Covid-2 Vaccination 10/05/2019, 11/02/2019, 07/26/2020, 06/26/2021   PNEUMOCOCCAL CONJUGATE-20 01/03/2022   PPD Test 05/23/2016, 04/06/2020   Tdap 01/19/2019    TDAP status: Up to date  Flu Vaccine status: Due, Education has been provided regarding the importance of this vaccine. Advised may receive this vaccine at local pharmacy or Health Dept. Aware to provide a copy of the vaccination record if  obtained from local pharmacy or Health Dept. Verbalized acceptance and understanding.  Pneumococcal vaccine status: Up to date  Covid-19 vaccine status: Information provided on how to obtain vaccines.   Qualifies for Shingles Vaccine? Yes   Zostavax completed No   Shingrix Completed?: No.    Education has been provided regarding the importance of this vaccine. Patient has been advised to call insurance  company to determine out of pocket expense if they have not yet received this vaccine. Advised may also receive vaccine at local pharmacy or Health Dept. Verbalized acceptance and understanding.  Screening Tests Health Maintenance  Topic Date Due   COVID-19 Vaccine (5 - Moderna risk series) 08/21/2021   INFLUENZA VACCINE  03/25/2022   Zoster Vaccines- Shingrix (1 of 2) 09/11/2022 (Originally 05/08/1975)   MAMMOGRAM  02/13/2024   COLONOSCOPY (Pts 45-74yr Insurance coverage will need to be confirmed)  02/01/2026   TETANUS/TDAP  01/18/2029   Pneumonia Vaccine 66 Years old  Completed   DEXA SCAN  Completed   Hepatitis C Screening  Completed   HPV VACCINES  Aged Out    Health Maintenance  Health Maintenance Due  Topic Date Due   COVID-19 Vaccine (5 - Moderna risk series) 08/21/2021   INFLUENZA VACCINE  03/25/2022    Colorectal cancer screening: Type of screening: Colonoscopy. Completed 2020. Repeat every 7 years  Mammogram status: Completed  . Repeat every year  Bone Density status: Completed 2023. Results reflect: Bone density results: NORMAL. Repeat every 10 years.  Lung Cancer Screening: (Low Dose CT Chest recommended if Age 66-80years, 30 pack-year currently smoking OR have quit w/in 15years.) does qualify.   Lung Cancer Screening Referral: ordered  Additional Screening:  Hepatitis C Screening: does not qualify; Completed 2020  Vision Screening: Recommended annual ophthalmology exams for early detection of glaucoma and other disorders of the eye. Is the patient up to date with their annual eye exam?  No  Who is the provider or what is the name of the office in which the patient attends annual eye exams?  If pt is not established with a provider, would they like to be referred to a provider to establish care? No .   Dental Screening: Recommended annual dental exams for proper oral hygiene  Community Resource Referral / Chronic Care Management: CRR required this visit?   No   CCM required this visit?  No      Plan:     I have personally reviewed and noted the following in the patient's chart:   Medical and social history Use of alcohol, tobacco or illicit drugs  Current medications and supplements including opioid prescriptions. Patient is not currently taking opioid prescriptions. Functional ability and status Nutritional status Physical activity Advanced directives List of other physicians Hospitalizations, surgeries, and ER visits in previous 12 months Vitals Screenings to include cognitive, depression, and falls Referrals and appointments  In addition, I have reviewed and discussed with patient certain preventive protocols, quality metrics, and best practice recommendations. A written personalized care plan for preventive services as well as general preventive health recommendations were provided to patient.     JLeroy Kennedy LPN   [id number]  Nurse Notes:

## 2022-07-06 DIAGNOSIS — J432 Centrilobular emphysema: Secondary | ICD-10-CM | POA: Insufficient documentation

## 2022-07-06 DIAGNOSIS — E559 Vitamin D deficiency, unspecified: Secondary | ICD-10-CM | POA: Insufficient documentation

## 2022-07-06 DIAGNOSIS — I700 Atherosclerosis of aorta: Secondary | ICD-10-CM | POA: Insufficient documentation

## 2022-07-06 NOTE — Patient Instructions (Signed)

## 2022-07-09 ENCOUNTER — Ambulatory Visit (INDEPENDENT_AMBULATORY_CARE_PROVIDER_SITE_OTHER): Payer: Medicare PPO | Admitting: Nurse Practitioner

## 2022-07-09 ENCOUNTER — Encounter: Payer: Self-pay | Admitting: Nurse Practitioner

## 2022-07-09 VITALS — BP 99/71 | HR 76 | Temp 98.1°F | Ht 64.0 in | Wt 195.2 lb

## 2022-07-09 DIAGNOSIS — J432 Centrilobular emphysema: Secondary | ICD-10-CM

## 2022-07-09 DIAGNOSIS — F331 Major depressive disorder, recurrent, moderate: Secondary | ICD-10-CM

## 2022-07-09 DIAGNOSIS — F17210 Nicotine dependence, cigarettes, uncomplicated: Secondary | ICD-10-CM

## 2022-07-09 DIAGNOSIS — R7301 Impaired fasting glucose: Secondary | ICD-10-CM

## 2022-07-09 DIAGNOSIS — E782 Mixed hyperlipidemia: Secondary | ICD-10-CM

## 2022-07-09 DIAGNOSIS — E559 Vitamin D deficiency, unspecified: Secondary | ICD-10-CM

## 2022-07-09 DIAGNOSIS — Z23 Encounter for immunization: Secondary | ICD-10-CM

## 2022-07-09 DIAGNOSIS — I700 Atherosclerosis of aorta: Secondary | ICD-10-CM | POA: Diagnosis not present

## 2022-07-09 DIAGNOSIS — Z6835 Body mass index (BMI) 35.0-35.9, adult: Secondary | ICD-10-CM

## 2022-07-09 DIAGNOSIS — E6609 Other obesity due to excess calories: Secondary | ICD-10-CM | POA: Diagnosis not present

## 2022-07-09 MED ORDER — ATORVASTATIN CALCIUM 10 MG PO TABS: ORAL_TABLET | ORAL | 4 refills | Status: DC

## 2022-07-09 NOTE — Assessment & Plan Note (Signed)
BMI 33.51.  Recommended eating smaller high protein, low fat meals more frequently and exercising 30 mins a day 5 times a week with a goal of 10-15lb weight loss in the next 3 months. Patient voiced their understanding and motivation to adhere to these recommendations.

## 2022-07-09 NOTE — Assessment & Plan Note (Signed)
Noted on recent labs and does take Abilify for mood, will check A1c today.  Recommend ongoing diet focus.

## 2022-07-09 NOTE — Assessment & Plan Note (Signed)
Chronic, noted on imaging 01/04/22.  Recommend she continue statin therapy and adjust as needed.  Start Baby ASA 81 MG daily.

## 2022-07-09 NOTE — Assessment & Plan Note (Signed)
Ongoing, noted on past labs. Recheck today and recommend she continue Vitamin D3 2000 units daily.  Recent DEXA was normal.

## 2022-07-09 NOTE — Progress Notes (Signed)
BP 99/71   Pulse 76   Temp 98.1 F (36.7 C) (Oral)   Ht '5\' 4"'$  (1.626 m)   Wt 195 lb 3.2 oz (88.5 kg)   SpO2 97%   BMI 33.51 kg/m    Subjective:    Patient ID: Heather Hill, female    DOB: Jun 20, 1956, 66 y.o.   MRN: [id number]  HPI: Heather Hill is a 67 y.o. female  Chief Complaint  Patient presents with   Hyperlipidemia   Depression   DEPRESSION/ANXIETY Currently on Abilify, Ativan, Prozac, Prazosin, and Trazodone which is managed by [facility], last saw in September or October per patient.  Her husband passed away last year, which was difficult for her. Mood status: stable Satisfied with current treatment?: no Symptom severity: mild  Duration of current treatment : chronic Side effects: no Medication compliance: good compliance Psychotherapy/counseling: not current Previous psychiatric medications: multiple she does not recall Depressed mood: occasional Anxious mood: only when not enough sleep Anhedonia: no Significant weight loss or gain: no Insomnia: yes hard to fall asleep, however this has improved with medication Fatigue: no Feelings of worthlessness or guilt: no Impaired concentration/indecisiveness: no Suicidal ideations: no Hopelessness: yes Crying spells: no    07/09/2022   10:15 AM 06/11/2022    9:42 AM 06/11/2022    9:41 AM 01/03/2022   11:01 AM 07/01/2021    4:06 PM  Depression screen PHQ 2/9  Decreased Interest 1 0 0 3 2  Down, Depressed, Hopeless 2 0 0 2 3  PHQ - 2 Score 3 0 0 5 5  Altered sleeping 3 0  3 3  Tired, decreased energy 3 0  3 3  Change in appetite 3 0  3 3  Feeling bad or failure about yourself  0 0  0 1  Trouble concentrating 2 0  2 3  Moving slowly or fidgety/restless 0 0  2 1  Suicidal thoughts 0 0  0 0  PHQ-9 Score 14 0  18 19  Difficult doing work/chores Somewhat difficult Not difficult at all  Not difficult at all Somewhat difficult      07/09/2022   10:16 AM 01/03/2022   11:01 AM  07/01/2021    4:08 PM 08/05/2019    9:28 AM  GAD 7 : Generalized Anxiety Score  Nervous, Anxious, on Edge '2 3 3 3  '$ Control/stop worrying '2 2 3 2  '$ Worry too much - different things '2 2 3 3  '$ Trouble relaxing '3 3 3 3  '$ Restless 0 0 2 2  Easily annoyed or irritable 0 '2 2 3  '$ Afraid - awful might happen '3 2 3 3  '$ Total GAD 7 Score '12 14 19 19  '$ Anxiety Difficulty Somewhat difficult Not difficult at all Somewhat difficult Somewhat difficult   COPD Continues to smoke 1/2 PPD and not interested in quitting.  Started smoking when in 9th grade.  Went for initial lung screening on 01/22/22, this noted mild centrilobular emphysema and aortic atherosclerosis.  Did notice left axillary lymphadenopathy for which she had further imaging and biopsy performed which was benign.    History of low Vitamin D levels, takes supplement = DEXA normal.  COPD status: stable Satisfied with current treatment?: yes Oxygen use: no Dyspnea frequency: no Cough frequency: no Rescue inhaler frequency:   Limitation of activity: no Productive cough: none Last Spirometry: none Pneumovax: Up to Date Influenza: Up to Date   HYPERLIPIDEMIA Continues on Atorvastatin 10 MG daily, reports she  ran out and has been out of it for one week.   Hyperlipidemia status: good compliance Satisfied with current treatment?  yes Side effects:  no Medication compliance: good compliance Past cholesterol meds: atorvastain (lipitor) Supplements: none Aspirin:  no The ASCVD Risk score (Arnett DK, et al., 2019) failed to calculate for the following reasons:   The valid total cholesterol range is 130 to 320 mg/dL   Relevant past medical, surgical, family and social history reviewed and updated as indicated. Interim medical history since our last visit reviewed. Allergies and medications reviewed and updated.  Review of Systems  Constitutional:  Negative for activity change, appetite change, diaphoresis, fatigue and fever.  Respiratory:  Negative.    Cardiovascular: Negative.   Psychiatric/Behavioral:  Positive for sleep disturbance. Negative for decreased concentration, self-injury and suicidal ideas. The patient is nervous/anxious.     Per HPI unless specifically indicated above     Objective:    BP 99/71   Pulse 76   Temp 98.1 F (36.7 C) (Oral)   Ht '5\' 4"'$  (1.626 m)   Wt 195 lb 3.2 oz (88.5 kg)   SpO2 97%   BMI 33.51 kg/m   Wt Readings from Last 3 Encounters:  07/09/22 195 lb 3.2 oz (88.5 kg)  05/09/22 194 lb (88 kg)  04/10/22 194 lb (88 kg)    Physical Exam Vitals and nursing note reviewed.  Constitutional:      General: She is awake. She is not in acute distress.    Appearance: She is well-developed and well-groomed. She is obese. She is not ill-appearing or toxic-appearing.  HENT:     Head: Normocephalic.     Right Ear: Hearing normal.     Left Ear: Hearing normal.  Eyes:     General: Lids are normal.        Right eye: No discharge.        Left eye: No discharge.     Conjunctiva/sclera: Conjunctivae normal.  Neck:     Thyroid: No thyromegaly.     Vascular: No carotid bruit.  Cardiovascular:     Rate and Rhythm: Normal rate.     Heart sounds: Normal heart sounds.     No systolic murmur is present.     No diastolic murmur is present.  Pulmonary:     Effort: Pulmonary effort is normal. No accessory muscle usage or respiratory distress.  Abdominal:     General: Bowel sounds are normal. There is no distension.     Palpations: Abdomen is soft.     Tenderness: There is no abdominal tenderness.  Musculoskeletal:        General: Normal range of motion.     Cervical back: Normal range of motion.     Right lower leg: No edema.     Left lower leg: No edema.  Neurological:     Mental Status: She is alert and oriented to person, place, and time.  Psychiatric:        Attention and Perception: Attention normal.        Mood and Affect: Mood normal.        Speech: Speech normal.        Behavior:  Behavior normal. Behavior is cooperative.     Results for orders placed or performed during the hospital encounter of 04/10/22  Surgical pathology  Result Value Ref Range   SURGICAL PATHOLOGY      SURGICAL PATHOLOGY CASE: [id number] PATIENT: Heather Hill Surgical Pathology Report  Specimen Submitted: A. Lymph node, left axillary  Clinical History: Left axillary lymphadenopathy    DIAGNOSIS: A. LYMPH NODE, LEFT AXILLARY; EXCISION: - BENIGN LYMPH NODE WITH REACTIVE HYPERPLASIA. - NO EVIDENCE OF MALIGNANCY.  Comment: Sections display an enlarged lymph node with intact follicular architecture, comprised of numerous follicles of variable size, with mild expansion of the interfollicular compartment. There is moderate sinus histiocytosis. Prominent germinal centers are comprised of a normal complement of small centrocytes, larger centroblasts, and tingible body macrophages. These germinal centers show appropriate polarization, with mildly accentuated mantle/marginal zones. Interfollicular regions are without evidence of an abnormal large cell population.  Immunohistochemical studies highlight WG95 + B cell follicles, with AO1+ T cells in interfo llicular regions.  CD5 marks mature T cells, without aberrant B-cell staining.  CD10 decorates the germinal centers, with CD23 highlighting the underlying intact follicular dendritic meshwork, without abnormal expansion.  Bcl-2 shows appropriate marking of interfollicular T cells and mantle/marginal B cells, without abnormal follicular marking.  CD138 stains scattered plasma cells, without definite increase.  A cyclin D1 stain is negative.  Taken together, the findings are most suggestive of sampling of a reactive lymph node.  Flow cytometric studies performed on the prior core biopsy ([id number]) showed no immunophenotypic evidence of a monoclonal B-cell or aberrant T-cell population. These findings are relatively  non-specific, and do not suggest a specific cause for the patient's lymphadenopathy. The differential diagnosis may include viral infection, autoimmune/rheumatologic disease, dermatopathic change, or a variety of other etiologies. There is no evidence of meta static carcinoma.  IHC slides were prepared by [facility], [place], [place]. All controls stained appropriately.  This test was developed and its performance characteristics determined by [facility]. It has not been cleared or approved by the [place] Food and Drug Administration. The FDA does not require this test to go through premarket FDA review. This test is used for clinical purposes. It should not be regarded as investigational or for research. This laboratory is certified under the Clinical Laboratory Improvement Amendments (CLIA) as qualified to perform high complexity clinical laboratory testing.  GROSS DESCRIPTION: A. Labeled: Left axillary lymph node Received: Fresh Collection time: 8:30 AM on 04/10/2022 Placed into formalin time: 9:04 AM on 04/10/2022 Tissue fragment(s): 1 Size: 2.7 x 2.2 x 1.5 cm Description: Received is a pink to red candidate lymph node with tan to red, fleshy cut surfaces and a focally hemorrhagic area Entirel y submitted in 8 cassettes.  CM 04/10/2022  Final Diagnosis performed by Allena Napoleon, MD.   Electronically signed 04/15/2022 1:49:55PM The electronic signature indicates that the named Attending Pathologist has evaluated the specimen Technical component performed at [facility], [address], [place], [place] [address] Lab: [phone] Dir: Rush Farmer, MD, MMM  Professional component performed at [facility], [facility] [place], [place], [place] [address] Lab: [phone] Dir: Kathi Simpers, MD       Assessment & Plan:   Problem List Items Addressed This Visit       Cardiovascular and Mediastinum   Aortic atherosclerosis ([place])     Chronic, noted on imaging 01/04/22.  Recommend she continue statin therapy and adjust as needed.  Start Baby ASA 81 MG daily.      Relevant Medications   prazosin (MINIPRESS) 1 MG capsule   atorvastatin (LIPITOR) 10 MG tablet   Other Relevant Orders   Comprehensive metabolic panel   Lipid Panel w/o Chol/HDL Ratio     Respiratory   Centrilobular emphysema (HCC) -  Primary    Chronic, noted on initial CT lung screening 01/22/22.  At this time no inhalers or symptoms reported.  Will plan on spirometry at next visit to further assess, discussed with patient.  She agrees with plan of care.  Initiate inhalers as needed.  Recommend complete cessation of smoking.      Relevant Orders   CBC with Differential/Platelet     Endocrine   IFG (impaired fasting glucose)    Noted on recent labs and does take Abilify for mood, will check A1c today.  Recommend ongoing diet focus.      Relevant Orders   HgB A1c     Other   Hyperlipidemia    Chronic, ongoing with risk factors.  Continue statin and adjust dose as needed. Lipid panel and CMP today.  Return in 6 months for follow-up.      Relevant Medications   prazosin (MINIPRESS) 1 MG capsule   atorvastatin (LIPITOR) 10 MG tablet   Other Relevant Orders   Comprehensive metabolic panel   Lipid Panel w/o Chol/HDL Ratio   Moderate episode of recurrent major depressive disorder (HCC)    Chronic, ongoing, suspect some PTSD due to past traumatic experience with loss of son & possible Bipolar due to family history.  Denies SI/ HI.  Continue current medication regimen and collaboration with psychiatry. She is aware all psychiatry medications are to remain to be obtained from her psychiatrist.  Return in 6 months.      Relevant Orders   TSH   Nicotine dependence, cigarettes, uncomplicated    I have recommended complete cessation of tobacco use. I have discussed various options available for assistance with tobacco cessation including over the counter  methods (Nicotine gum, patch and lozenges). We also discussed prescription options (Chantix, Nicotine Inhaler / Nasal Spray). The patient is not interested in pursuing any prescription tobacco cessation options at this time.  Continue annual lung screening.       Obesity    BMI 33.51.  Recommended eating smaller high protein, low fat meals more frequently and exercising 30 mins a day 5 times a week with a goal of 10-15lb weight loss in the next 3 months. Patient voiced their understanding and motivation to adhere to these recommendations.       Vitamin D deficiency    Ongoing, noted on past labs. Recheck today and recommend she continue Vitamin D3 2000 units daily.  Recent DEXA was normal.      Relevant Orders   VITAMIN D 25 Hydroxy (Vit-D Deficiency, Fractures)   Other Visit Diagnoses     Flu vaccine need       Flu vaccine in office today.   Relevant Orders   Flu Vaccine QUAD High Dose(Fluad) (Completed)        Follow up plan: Return in about 6 months (around 01/07/2023) for MOOD, HLD, EMPHYSEMA -- need spirometry.

## 2022-07-09 NOTE — Assessment & Plan Note (Signed)
Chronic, ongoing, suspect some PTSD due to past traumatic experience with loss of son & possible Bipolar due to family history.  Denies SI/ HI.  Continue current medication regimen and collaboration with psychiatry. She is aware all psychiatry medications are to remain to be obtained from her psychiatrist.  Return in 6 months.

## 2022-07-09 NOTE — Assessment & Plan Note (Addendum)
Chronic, noted on initial CT lung screening 01/22/22.  At this time no inhalers or symptoms reported.  Will plan on spirometry at next visit to further assess, discussed with patient.  She agrees with plan of care.  Initiate inhalers as needed.  Recommend complete cessation of smoking.

## 2022-07-09 NOTE — Assessment & Plan Note (Signed)
Chronic, ongoing with risk factors.  Continue statin and adjust dose as needed. Lipid panel and CMP today.  Return in 6 months for follow-up.

## 2022-07-09 NOTE — Assessment & Plan Note (Signed)
I have recommended complete cessation of tobacco use. I have discussed various options available for assistance with tobacco cessation including over the counter methods (Nicotine gum, patch and lozenges). We also discussed prescription options (Chantix, Nicotine Inhaler / Nasal Spray). The patient is not interested in pursuing any prescription tobacco cessation options at this time.  Continue annual lung screening. 

## 2022-07-10 ENCOUNTER — Other Ambulatory Visit: Payer: Self-pay | Admitting: Nurse Practitioner

## 2022-07-10 LAB — COMPREHENSIVE METABOLIC PANEL
ALT: 15 IU/L (ref 0–32)
AST: 13 IU/L (ref 0–40)
Albumin/Globulin Ratio: 1.3 (ref 1.2–2.2)
Albumin: 3.9 g/dL (ref 3.9–4.9)
Alkaline Phosphatase: 113 IU/L (ref 44–121)
BUN/Creatinine Ratio: 9 — ABNORMAL LOW (ref 12–28)
BUN: 8 mg/dL (ref 8–27)
Bilirubin Total: 0.4 mg/dL (ref 0.0–1.2)
CO2: 28 mmol/L (ref 20–29)
Calcium: 9.4 mg/dL (ref 8.7–10.3)
Chloride: 103 mmol/L (ref 96–106)
Creatinine, Ser: 0.91 mg/dL (ref 0.57–1.00)
Globulin, Total: 2.9 g/dL (ref 1.5–4.5)
Glucose: 95 mg/dL (ref 70–99)
Potassium: 4.5 mmol/L (ref 3.5–5.2)
Sodium: 142 mmol/L (ref 134–144)
Total Protein: 6.8 g/dL (ref 6.0–8.5)
eGFR: 70 mL/min/{1.73_m2} (ref >59)

## 2022-07-10 LAB — CBC WITH DIFFERENTIAL/PLATELET
Basophils Absolute: 0.1 10*3/uL (ref 0.0–0.2)
Basos: 1 %
EOS (ABSOLUTE): 0.2 10*3/uL (ref 0.0–0.4)
Eos: 3 %
Hematocrit: 39.8 % (ref 34.0–46.6)
Hemoglobin: 12.7 g/dL (ref 11.1–15.9)
Immature Grans (Abs): 0 10*3/uL (ref 0.0–0.1)
Immature Granulocytes: 0 %
Lymphocytes Absolute: 2.1 10*3/uL (ref 0.7–3.1)
Lymphs: 34 %
MCH: 26.8 pg (ref 26.6–33.0)
MCHC: 31.9 g/dL (ref 31.5–35.7)
MCV: 84 fL (ref 79–97)
Monocytes Absolute: 0.6 10*3/uL (ref 0.1–0.9)
Monocytes: 10 %
Neutrophils Absolute: 3.2 10*3/uL (ref 1.4–7.0)
Neutrophils: 52 %
Platelets: 276 10*3/uL (ref 150–450)
RBC: 4.73 x10E6/uL (ref 3.77–5.28)
RDW: 14.5 % (ref 11.7–15.4)
WBC: 6.1 10*3/uL (ref 3.4–10.8)

## 2022-07-10 LAB — LIPID PANEL W/O CHOL/HDL RATIO
Cholesterol, Total: 132 mg/dL (ref 100–199)
HDL: 47 mg/dL (ref >39)
LDL Chol Calc (NIH): 67 mg/dL (ref 0–99)
Triglycerides: 94 mg/dL (ref 0–149)
VLDL Cholesterol Cal: 18 mg/dL (ref 5–40)

## 2022-07-10 LAB — VITAMIN D 25 HYDROXY (VIT D DEFICIENCY, FRACTURES): Vit D, 25-Hydroxy: 16.7 ng/mL — ABNORMAL LOW (ref 30.0–100.0)

## 2022-07-10 LAB — HEMOGLOBIN A1C
Est. average glucose Bld gHb Est-mCnc: 128 mg/dL
Hgb A1c MFr Bld: 6.1 % — ABNORMAL HIGH (ref 4.8–5.6)

## 2022-07-10 LAB — TSH: TSH: 0.94 u[IU]/mL (ref 0.450–4.500)

## 2022-07-10 MED ORDER — CHOLECALCIFEROL 1.25 MG (50000 UT) PO TABS: 1.0000 | ORAL_TABLET | ORAL | 4 refills | Status: DC

## 2022-07-10 NOTE — Progress Notes (Signed)
Good morning, please let Heather Hill know her labs have returned: - Kidney and liver function remain normal - Vitamin D remains on lower side, I am going to send in a weekly higher dose Vitamin D3 supplement for you to take for bone health. - A1c 6.1% == remains in prediabetic range please ensure focus on diet and regular exercise. - All remaining labs are stable, continue current medications.  Have a great day!! Keep being stellar!!  Thank you for allowing me to participate in your care.  I appreciate you. Kindest regards, Genevieve Ritzel

## 2022-11-26 ENCOUNTER — Encounter: Payer: Self-pay | Admitting: Nurse Practitioner

## 2022-11-26 ENCOUNTER — Ambulatory Visit (INDEPENDENT_AMBULATORY_CARE_PROVIDER_SITE_OTHER): Payer: Medicare HMO | Admitting: Nurse Practitioner

## 2022-11-26 VITALS — BP 103/70 | HR 84 | Temp 98.0°F | Ht 64.02 in | Wt 191.3 lb

## 2022-11-26 DIAGNOSIS — R42 Dizziness and giddiness: Secondary | ICD-10-CM | POA: Diagnosis not present

## 2022-11-26 DIAGNOSIS — Z599 Problem related to housing and economic circumstances, unspecified: Secondary | ICD-10-CM

## 2022-11-26 DIAGNOSIS — E6609 Other obesity due to excess calories: Secondary | ICD-10-CM

## 2022-11-26 DIAGNOSIS — F17210 Nicotine dependence, cigarettes, uncomplicated: Secondary | ICD-10-CM

## 2022-11-26 DIAGNOSIS — F331 Major depressive disorder, recurrent, moderate: Secondary | ICD-10-CM | POA: Diagnosis not present

## 2022-11-26 DIAGNOSIS — R69 Illness, unspecified: Secondary | ICD-10-CM | POA: Diagnosis not present

## 2022-11-26 DIAGNOSIS — J432 Centrilobular emphysema: Secondary | ICD-10-CM

## 2022-11-26 DIAGNOSIS — Z6832 Body mass index (BMI) 32.0-32.9, adult: Secondary | ICD-10-CM | POA: Diagnosis not present

## 2022-11-26 MED ORDER — MECLIZINE HCL 12.5 MG PO TABS
12.5000 mg | ORAL_TABLET | Freq: Three times a day (TID) | ORAL | 0 refills | Status: DC | PRN
Start: 2022-11-26 — End: 2023-02-03

## 2022-11-26 MED ORDER — ARIPIPRAZOLE 5 MG PO TABS
5.0000 mg | ORAL_TABLET | Freq: Every day | ORAL | 0 refills | Status: DC
Start: 2022-11-26 — End: 2022-12-21

## 2022-11-26 MED ORDER — TRAZODONE HCL 100 MG PO TABS
100.0000 mg | ORAL_TABLET | Freq: Every day | ORAL | 0 refills | Status: DC
Start: 2022-11-26 — End: 2022-12-16

## 2022-11-26 MED ORDER — FLUOXETINE HCL 40 MG PO CAPS: 40.0000 mg | ORAL_CAPSULE | Freq: Two times a day (BID) | ORAL | 0 refills | Status: DC

## 2022-11-26 NOTE — Assessment & Plan Note (Signed)
Referral to CCM team and food bag provided today.

## 2022-11-26 NOTE — Assessment & Plan Note (Signed)
Chronic, noted on initial CT lung screening 01/22/22.  At this time no inhalers or symptoms reported.  Will plan on spirometry at next visit to further assess, discussed with patient.  She agrees with plan of care.  Initiate inhalers as needed.  Recommend complete cessation of smoking.  Referral to CCM team.  Continue yearly lung screening.

## 2022-11-26 NOTE — Patient Instructions (Signed)
Managing Anxiety, Adult After being diagnosed with anxiety, you may be relieved to know why you have felt or behaved a certain way. You may also feel overwhelmed about the treatment ahead and what it will mean for your life. With care and support, you can manage your anxiety. How to manage lifestyle changes Understanding the difference between stress and anxiety Although stress can play a role in anxiety, it is not the same as anxiety. Stress is your body's reaction to life changes and events, both good and bad. Stress is often caused by something external, such as a deadline, test, or competition. It normally goes away after the event has ended and will last just a few hours. But, stress can be ongoing and can lead to more than just stress. Anxiety is caused by something internal, such as imagining a terrible outcome or worrying that something will go wrong that will greatly upset you. Anxiety often does not go away even after the event is over, and it can become a long-term (chronic) worry. Lowering stress and anxiety Talk with your health care provider or a counselor to learn more about lowering anxiety and stress. They may suggest tension-reduction techniques, such as: Music. Spend time creating or listening to music that you enjoy and that inspires you. Mindfulness-based meditation. Practice being aware of your normal breaths while not trying to control your breathing. It can be done while sitting or walking. Centering prayer. Focus on a word, phrase, or sacred image that means something to you and brings you peace. Deep breathing. Expand your stomach and inhale slowly through your nose. Hold your breath for 3-5 seconds. Then breathe out slowly, letting your stomach muscles relax. Self-talk. Learn to notice and spot thought patterns that lead to anxiety reactions. Change those patterns to thoughts that feel peaceful. Muscle relaxation. Take time to tense muscles and then relax them. Choose a  tension-reduction technique that fits your lifestyle and personality. These techniques take time and practice. Set aside 5-15 minutes a day to do them. Specialized therapists can offer counseling and training in these techniques. The training to help with anxiety may be covered by some insurance plans. Other things you can do to manage stress and anxiety include: Keeping a stress diary. This can help you learn what triggers your reaction and then learn ways to manage your response. Thinking about how you react to certain situations. You may not be able to control everything, but you can control your response. Making time for activities that help you relax and not feeling guilty about spending your time in this way. Doing visual imagery. This involves imagining or creating mental pictures to help you relax. Practicing yoga. Through yoga poses, you can lower tension and relax.  Medicines Medicines for anxiety include: Antidepressant medicines. These are usually prescribed for long-term daily control. Anti-anxiety medicines. These may be added in severe cases, especially when panic attacks occur. When used together, medicines, psychotherapy, and tension-reduction techniques may be the most effective treatment. Relationships Relationships can play a big part in helping you recover. Spend more time connecting with trusted friends and family members. Think about going to couples counseling if you have a partner, taking family education classes, or going to family therapy. Therapy can help you and others better understand your anxiety. How to recognize changes in your anxiety Everyone responds differently to treatment for anxiety. Recovery from anxiety happens when symptoms lessen and stop interfering with your daily life at home or work. This may mean that you   will start to: Have better concentration and focus. Worry will interfere less in your daily thinking. Sleep better. Be less irritable. Have more  energy. Have improved memory. Try to recognize when your condition is getting worse. Contact your provider if your symptoms interfere with home or work and you feel like your condition is not improving. Follow these instructions at home: Activity Exercise. Adults should: Exercise for at least 150 minutes each week. The exercise should increase your heart rate and make you sweat (moderate-intensity exercise). Do strengthening exercises at least twice a week. Get the right amount and quality of sleep. Most adults need 7-9 hours of sleep each night. Lifestyle  Eat a healthy diet that includes plenty of vegetables, fruits, whole grains, low-fat dairy products, and lean protein. Do not eat a lot of foods that are high in fats, added sugars, or salt (sodium). Make choices that simplify your life. Do not use any products that contain nicotine or tobacco. These products include cigarettes, chewing tobacco, and vaping devices, such as e-cigarettes. If you need help quitting, ask your provider. Avoid caffeine, alcohol, and certain over-the-counter cold medicines. These may make you feel worse. Ask your pharmacist which medicines to avoid. General instructions Take over-the-counter and prescription medicines only as told by your provider. Keep all follow-up visits. This is to make sure you are managing your anxiety well or if you need more support. Where to find support You can get help and support from: Self-help groups. Online and community organizations. A trusted spiritual leader. Couples counseling. Family education classes. Family therapy. Where to find more information You may find that joining a support group helps you deal with your anxiety. The following sources can help you find counselors or support groups near you: Mental Health America: mentalhealthamerica.net Anxiety and Depression Association of America (ADAA): adaa.org National Alliance on Mental Illness (NAMI): nami.org Contact  a health care provider if: You have a hard time staying focused or finishing tasks. You spend many hours a day feeling worried about everyday life. You are very tired because you cannot stop worrying. You start to have headaches or often feel tense. You have chronic nausea or diarrhea. Get help right away if: Your heart feels like it is racing. You have shortness of breath. You have thoughts of hurting yourself or others. Get help right away if you feel like you may hurt yourself or others, or have thoughts about taking your own life. Go to your nearest emergency room or: Call 911. Call the National Suicide Prevention Lifeline at 1-800-273-8255 or 988. This is open 24 hours a day. Text the Crisis Text Line at 741741. This information is not intended to replace advice given to you by your health care provider. Make sure you discuss any questions you have with your health care provider. Document Revised: 05/20/2022 Document Reviewed: 12/02/2020 Elsevier Patient Education  2023 Elsevier Inc.  

## 2022-11-26 NOTE — Assessment & Plan Note (Signed)
New onset over past 2 weeks with increased stressors, has history of same.  Neuro exam reassuring today.  Meclizine ordered to take as needed, educated on this.  Suspect related to increased stressors.

## 2022-11-26 NOTE — Progress Notes (Addendum)
BP 103/70   Pulse 84   Temp 98 F (36.7 C) (Oral)   Ht 5' 4.02" (1.626 m)   Wt 191 lb 4.8 oz (86.8 kg)   SpO2 98%   BMI 32.82 kg/m    Subjective:    Patient ID: Heather Hill, female    DOB: 1956-08-01, 67 y.o.   MRN: [id number]  HPI: AVI FELVER is a 67 y.o. female  Chief Complaint  Patient presents with   Dizziness    For the past 2 weeks, patient thinks it may be related to her BP   DIZZINESS Started 2 weeks ago.  Lots of stressors financially at this time.  At times is unable to get food on table at home + is paying bills although sometimes late.  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  Goes to see new psychiatrist on 23rd of this month, no longer will be at [facility] and switching locally.  Has been off medications since January due to cost, only has $20 to name at this time.  Continues to smoke about 4-5 a day.  No recent alcohol use.  Lung screening last May 2023 noted emphysema and aortic atherosclerosis.   Duration: weeks Description of symptoms: off kilter Duration of episode:  all day at times Dizziness frequency:  had vertigo years ago Provoking factors: increase stressors recently Aggravating factors:   anxiety Triggered by rolling over in bed: no Triggered by bending over: no Aggravated by head movement: no Aggravated by exertion, coughing, loud noises: no Recent head injury: no Recent or current viral symptoms: no History of vasovagal episodes: no Nausea: yes Vomiting: no Tinnitus: no Hearing loss: no Aural fullness: no Headache: no Photophobia/phonophobia: no Unsteady gait: no Postural instability: no Diplopia, dysarthria, dysphagia or weakness: no Related to exertion: no Pallor: no Diaphoresis: no Dyspnea: no Chest pain: no     11/26/2022    2:09 PM 07/09/2022   10:15 AM 06/11/2022    9:42 AM 06/11/2022    9:41 AM 01/03/2022   11:01 AM  Depression  screen PHQ 2/9  Decreased Interest 3 1 0 0 3  Down, Depressed, Hopeless 3 2 0 0 2  PHQ - 2 Score 6 3 0 0 5  Altered sleeping 3 3 0  3  Tired, decreased energy 3 3 0  3  Change in appetite 3 3 0  3  Feeling bad or failure about yourself  1 0 0  0  Trouble concentrating 3 2 0  2  Moving slowly or fidgety/restless 3 0 0  2  Suicidal thoughts 0 0 0  0  PHQ-9 Score 22 14 0  18  Difficult doing work/chores Very difficult Somewhat difficult Not difficult at all  Not difficult at all       11/26/2022    2:09 PM 07/09/2022   10:16 AM 01/03/2022   11:01 AM 07/01/2021    4:08 PM  GAD 7 : Generalized Anxiety Score  Nervous, Anxious, on Edge 3 2 3 3   Control/stop worrying 3 2 2 3   Worry too much - different things 3 2 2 3   Trouble relaxing 3 3 3 3   Restless 3 0 0 2  Easily annoyed or irritable 3 0 2 2  Afraid - awful might happen 3 3 2 3   Total GAD 7 Score 21 12 14 19   Anxiety Difficulty Extremely difficult Somewhat difficult Not  difficult at all Somewhat difficult   SDOH Screenings   Food Insecurity: Food Insecurity Present (11/26/2022)  Housing: Low Risk  (11/26/2022)  Transportation Needs: No Transportation Needs (11/26/2022)  Utilities: Not At Risk (11/26/2022)  Alcohol Screen: Low Risk  (11/26/2022)  Depression (PHQ2-9): High Risk (11/26/2022)  Financial Resource Strain: High Risk (11/26/2022)  Physical Activity: Insufficiently Active (11/26/2022)  Social Connections: Moderately Isolated (11/26/2022)  Stress: Stress Concern Present (11/26/2022)  Tobacco Use: High Risk (11/26/2022)   Relevant past medical, surgical, family and social history reviewed and updated as indicated. Interim medical history since our last visit reviewed. Allergies and medications reviewed and updated.  Review of Systems  Constitutional:  Negative for activity change, appetite change, diaphoresis, fatigue and fever.  Respiratory:  Negative for cough, chest tightness and shortness of breath.   Cardiovascular:  Negative for  chest pain, palpitations and leg swelling.  Gastrointestinal: Negative.   Endocrine: Negative for cold intolerance, heat intolerance, polydipsia, polyphagia and polyuria.  Neurological:  Positive for dizziness. Negative for syncope, weakness, light-headedness, numbness and headaches.  Psychiatric/Behavioral:  Positive for decreased concentration and sleep disturbance. Negative for self-injury and suicidal ideas. The patient is nervous/anxious.    Per HPI unless specifically indicated above     Objective:    BP 103/70   Pulse 84   Temp 98 F (36.7 C) (Oral)   Ht 5' 4.02" (1.626 m)   Wt 191 lb 4.8 oz (86.8 kg)   SpO2 98%   BMI 32.82 kg/m   Wt Readings from Last 3 Encounters:  11/26/22 191 lb 4.8 oz (86.8 kg)  07/09/22 195 lb 3.2 oz (88.5 kg)  05/09/22 194 lb (88 kg)    Physical Exam Vitals and nursing note reviewed.  Constitutional:      General: She is awake. She is not in acute distress.    Appearance: She is well-developed and well-groomed. She is obese. She is not ill-appearing or toxic-appearing.  HENT:     Head: Normocephalic.     Right Ear: Hearing and external ear normal.     Left Ear: Hearing and external ear normal.  Eyes:     General: Lids are normal.        Right eye: No discharge.        Left eye: No discharge.     Extraocular Movements: Extraocular movements intact.     Right eye: No nystagmus.     Left eye: No nystagmus.     Conjunctiva/sclera: Conjunctivae normal.     Pupils: Pupils are equal, round, and reactive to light.     Visual Fields: Right eye visual fields normal and left eye visual fields normal.  Neck:     Thyroid: No thyromegaly.     Vascular: No carotid bruit.  Cardiovascular:     Rate and Rhythm: Normal rate and regular rhythm.     Heart sounds: Normal heart sounds. No murmur heard.    No gallop.  Pulmonary:     Effort: Pulmonary effort is normal. No accessory muscle usage or respiratory distress.     Breath sounds: Normal breath  sounds.  Abdominal:     General: Bowel sounds are normal.     Palpations: Abdomen is soft. There is no hepatomegaly or splenomegaly.  Musculoskeletal:     Cervical back: Normal range of motion and neck supple.     Right lower leg: No edema.     Left lower leg: No edema.  Lymphadenopathy:     Cervical: No cervical adenopathy.  Skin:    General: Skin is warm and dry.  Neurological:     Mental Status: She is alert and oriented to person, place, and time.     Cranial Nerves: Cranial nerves 2-12 are intact.     Sensory: Sensation is intact.     Motor: Motor function is intact.     Coordination: Coordination is intact.     Gait: Gait is intact.     Deep Tendon Reflexes: Reflexes are normal and symmetric.     Reflex Scores:      Brachioradialis reflexes are 2+ on the right side and 2+ on the left side.      Patellar reflexes are 2+ on the right side and 2+ on the left side. Psychiatric:        Attention and Perception: Attention normal.        Mood and Affect: Mood normal.        Speech: Speech normal.        Behavior: Behavior normal. Behavior is cooperative.        Thought Content: Thought content normal.     Results for orders placed or performed in visit on 07/09/22  Comprehensive metabolic panel  Result Value Ref Range   Glucose 95 70 - 99 mg/dL   BUN 8 8 - 27 mg/dL   Creatinine, Ser 0.91 0.57 - 1.00 mg/dL   eGFR 70 >59 mL/min/1.73   BUN/Creatinine Ratio 9 (L) 12 - 28   Sodium 142 134 - 144 mmol/L   Potassium 4.5 3.5 - 5.2 mmol/L   Chloride 103 96 - 106 mmol/L   CO2 28 20 - 29 mmol/L   Calcium 9.4 8.7 - 10.3 mg/dL   Total Protein 6.8 6.0 - 8.5 g/dL   Albumin 3.9 3.9 - 4.9 g/dL   Globulin, Total 2.9 1.5 - 4.5 g/dL   Albumin/Globulin Ratio 1.3 1.2 - 2.2   Bilirubin Total 0.4 0.0 - 1.2 mg/dL   Alkaline Phosphatase 113 44 - 121 IU/L   AST 13 0 - 40 IU/L   ALT 15 0 - 32 IU/L  Lipid Panel w/o Chol/HDL Ratio  Result Value Ref Range   Cholesterol, Total 132 100 - 199  mg/dL   Triglycerides 94 0 - 149 mg/dL   HDL 47 >39 mg/dL   VLDL Cholesterol Cal 18 5 - 40 mg/dL   LDL Chol Calc (NIH) 67 0 - 99 mg/dL  VITAMIN D 25 Hydroxy (Vit-D Deficiency, Fractures)  Result Value Ref Range   Vit D, 25-Hydroxy 16.7 (L) 30.0 - 100.0 ng/mL  HgB A1c  Result Value Ref Range   Hgb A1c MFr Bld 6.1 (H) 4.8 - 5.6 %   Est. average glucose Bld gHb Est-mCnc 128 mg/dL  CBC with Differential/Platelet  Result Value Ref Range   WBC 6.1 3.4 - 10.8 x10E3/uL   RBC 4.73 3.77 - 5.28 x10E6/uL   Hemoglobin 12.7 11.1 - 15.9 g/dL   Hematocrit 39.8 34.0 - 46.6 %   MCV 84 79 - 97 fL   MCH 26.8 26.6 - 33.0 pg   MCHC 31.9 31.5 - 35.7 g/dL   RDW 14.5 11.7 - 15.4 %   Platelets 276 150 - 450 x10E3/uL   Neutrophils 52 Not Estab. %   Lymphs 34 Not Estab. %   Monocytes 10 Not Estab. %   Eos 3 Not Estab. %   Basos 1 Not Estab. %   Neutrophils Absolute 3.2 1.4 - 7.0 x10E3/uL   Lymphocytes Absolute 2.1 0.7 -  3.1 x10E3/uL   Monocytes Absolute 0.6 0.1 - 0.9 x10E3/uL   EOS (ABSOLUTE) 0.2 0.0 - 0.4 x10E3/uL   Basophils Absolute 0.1 0.0 - 0.2 x10E3/uL   Immature Granulocytes 0 Not Estab. %   Immature Grans (Abs) 0.0 0.0 - 0.1 x10E3/uL  TSH  Result Value Ref Range   TSH 0.940 0.450 - 4.500 uIU/mL      Assessment & Plan:   Problem List Items Addressed This Visit       Respiratory   Centrilobular emphysema    Chronic, noted on initial CT lung screening 01/22/22.  At this time no inhalers or symptoms reported.  Will plan on spirometry at next visit to further assess, discussed with patient.  She agrees with plan of care.  Initiate inhalers as needed.  Recommend complete cessation of smoking.  Referral to CCM team.  Continue yearly lung screening.      Relevant Orders   AMB Referral to Chronic Care Management Services     Other   Moderate episode of recurrent major depressive disorder - Primary    Chronic, exacerbated as has been off medication since January.  Suspect some PTSD due to  past traumatic experience with loss of son & possible Bipolar due to family history.  Denies SI/ HI.  Continue current medication regimen and collaboration with psychiatry -- she had visit upcoming. She is aware all psychiatry medications are to remain to be obtained from her psychiatrist, however will send in refills today on Abilify/Prozac/Trazodone as suspect increased anxiety without medication on board is causing vertigo.  Return in May as scheduled.  Referral to CCM for SW, PharmD, and nursing -- as current social issues present with finances and medication costs.      Relevant Medications   FLUoxetine (PROZAC) 40 MG capsule   traZODone (DESYREL) 100 MG tablet   Other Relevant Orders   AMB Referral to Chronic Care Management Services   Need for financial support    Referral to CCM team and food bag provided today.      Nicotine dependence, cigarettes, uncomplicated    I have recommended complete cessation of tobacco use. I have discussed various options available for assistance with tobacco cessation including over the counter methods (Nicotine gum, patch and lozenges). We also discussed prescription options (Chantix, Nicotine Inhaler / Nasal Spray). The patient is not interested in pursuing any prescription tobacco cessation options at this time.  Continue annual lung screening.       Obesity    BMI 32.82.  Recommended eating smaller high protein, low fat meals more frequently and exercising 30 mins a day 5 times a week with a goal of 10-15lb weight loss in the next 3 months. Patient voiced their understanding and motivation to adhere to these recommendations.       Vertigo    New onset over past 2 weeks with increased stressors, has history of same.  Neuro exam reassuring today.  Meclizine ordered to take as needed, educated on this.  Suspect related to increased stressors.          Follow up plan: Return for as scheduled in May.

## 2022-11-26 NOTE — Assessment & Plan Note (Signed)
Chronic, exacerbated as has been off medication since January.  Suspect some PTSD due to past traumatic experience with loss of son & possible Bipolar due to family history.  Denies SI/ HI.  Continue current medication regimen and collaboration with psychiatry -- she had visit upcoming. She is aware all psychiatry medications are to remain to be obtained from her psychiatrist, however will send in refills today on Abilify/Prozac/Trazodone as suspect increased anxiety without medication on board is causing vertigo.  Return in May as scheduled.  Referral to CCM for SW, PharmD, and nursing -- as current social issues present with finances and medication costs.

## 2022-11-26 NOTE — Assessment & Plan Note (Signed)
I have recommended complete cessation of tobacco use. I have discussed various options available for assistance with tobacco cessation including over the counter methods (Nicotine gum, patch and lozenges). We also discussed prescription options (Chantix, Nicotine Inhaler / Nasal Spray). The patient is not interested in pursuing any prescription tobacco cessation options at this time.  Continue annual lung screening. 

## 2022-11-26 NOTE — Assessment & Plan Note (Signed)
BMI 32.82.  Recommended eating smaller high protein, low fat meals more frequently and exercising 30 mins a day 5 times a week with a goal of 10-15lb weight loss in the next 3 months. Patient voiced their understanding and motivation to adhere to these recommendations.

## 2022-12-05 ENCOUNTER — Telehealth: Payer: Self-pay

## 2022-12-05 NOTE — Progress Notes (Signed)
  Chronic Care Management   Note  12/05/2022 Name: Heather Hill MRN: [id number] DOB: [birth year]  Heather Hill is a 67 y.o. year old female who is a primary care patient of Cannady, Dorie Rank, NP. I reached out to [employer] by phone today in response to a referral sent by Ms. Tayjah L Corne's PCP.  Ms. Sipe was given information about Chronic Care Management services today including:  CCM service includes personalized support from designated clinical staff supervised by the physician, including individualized plan of care and coordination with other care providers 24/7 contact phone numbers for assistance for urgent and routine care needs. Service will only be billed when office clinical staff spend 20 minutes or more in a month to coordinate care. Only one practitioner may furnish and bill the service in a calendar month. The patient may stop CCM services at amy time (effective at the end of the month) by phone call to the office staff. The patient will be responsible for cost sharing (co-pay) or up to 20% of the service fee (after annual deductible is met)  Ms. Heather Hill  agreedto scheduling an appointment with the CCM RN Case Manager   Follow up plan: Patient agreed to scheduled appointment with RN Case Manager on 12/08/2022 Pharm d 12/10/2022(date/time).   Penne Lash, RMA Care Guide [facility]  [place], [place] [address] Direct Dial: [phone] [email or web address]

## 2022-12-08 ENCOUNTER — Ambulatory Visit (INDEPENDENT_AMBULATORY_CARE_PROVIDER_SITE_OTHER): Payer: Medicare HMO

## 2022-12-08 ENCOUNTER — Telehealth: Payer: Self-pay

## 2022-12-08 ENCOUNTER — Ambulatory Visit: Payer: Self-pay

## 2022-12-08 DIAGNOSIS — F331 Major depressive disorder, recurrent, moderate: Secondary | ICD-10-CM

## 2022-12-08 DIAGNOSIS — J432 Centrilobular emphysema: Secondary | ICD-10-CM

## 2022-12-08 NOTE — Progress Notes (Addendum)
See new encounter  Error in charting

## 2022-12-08 NOTE — Patient Instructions (Addendum)
Please call the care guide team at [phone] if you need to cancel or reschedule your appointment.   If you are experiencing a Mental Health or Behavioral Health Crisis or need someone to talk to, please call the Suicide and Crisis Lifeline: 988 call the [place] National Suicide Prevention Lifeline: (541) 128-3944 or TTY: 209-010-9714 TTY 865-262-3389) to talk to a trained counselor call 1-800-273-TALK (toll free, 24 hour hotline)   Following is a copy of the CCM Program Consent:  CCM service includes personalized support from designated clinical staff supervised by the physician, including individualized plan of care and coordination with other care providers 24/7 contact phone numbers for assistance for urgent and routine care needs. Service will only be billed when office clinical staff spend 20 minutes or more in a month to coordinate care. Only one practitioner may furnish and bill the service in a calendar month. The patient may stop CCM services at amy time (effective at the end of the month) by phone call to the office staff. The patient will be responsible for cost sharing (co-pay) or up to 20% of the service fee (after annual deductible is met)  Following is a copy of your full provider care plan:   Goals Addressed             This Visit's Progress    CCM Expected Outcome:  Monitor, Self-Manage and Reduce Symptoms [id number]       Current Barriers:  Knowledge Deficits related to resources in the community to help with expressed needs to effectively manage her health and wellbeing Care Coordination needs related to medication cost and mental health resources and support  in a patient with depression Chronic Disease Management support and education needs related to effective management of depression Loss of her husband in 2022, loss of her only son who was in the service in 2003  Planned Interventions: Evaluation of current treatment plan related to depression  and patient's  adherence to plan as established by provider Advised patient to call the office for changes in mood, anxiety, depression, or changes in her mental health and well being Provided education to patient re: GriefShare.org for support of effective management of the grief process with the loss of her husband in 2022 and her son in 2003 Reviewed medications with patient and discussed compliance. The patient states compliance with medications. Is receptive to working with the pharm D for help with medications cost constraints  Provided patient with mindfulness and stress reduction educational materials related to dealing effectively with anxiety and stress.  Reviewed scheduled/upcoming provider appointments including 01-07-2023 at 1020 am Care Guide referral for resources in the community for financial constraints, utilities, and food resources Social Work referral for help with questions about social support and if she would qualify for [facility] referral for help with cost constraints related to medication cost Discussed plans with patient for ongoing care management follow up and provided patient with direct contact information for care management team Advised patient to discuss changes in mood, anxiety, depression or other mentla health concerns with provider Screening for signs and symptoms of depression related to chronic disease state  Assessed social determinant of health barriers  Symptom Management: Take medications as prescribed   Attend all scheduled provider appointments Call provider office for new concerns or questions  call the Suicide and Crisis Lifeline: 988 call the [place] National Suicide Prevention Lifeline: 705-747-8558 or TTY: (779)398-5027 TTY (269) 636-7417) to talk to a trained counselor call 1-800-273-TALK (toll free, 24 hour hotline) if  experiencing a Mental Health or Behavioral Health Crisis   Follow Up Plan: Telephone follow up appointment with care management team  member scheduled for: 01-20-2023 at 0900 am       CCM:  Maintain, Monitor and Self-Manage Symptoms of COPD       Current Barriers:  Knowledge Deficits related to the importance of smoking cessation in a patient with COPD, support and education for resources to help with management of COPD and other chronic conditions Care Coordination needs related to medication cost and community resources needed in a patient with COPD Chronic Disease Management support and education needs related to Effective management of COPD Current everyday smoker  Planned Interventions: Provided patient with basic written and verbal COPD education on self care/management/and exacerbation prevention Advised patient to track and manage COPD triggers Provided instruction about proper use of medications used for management of COPD including inhalers Advised patient to self assesses COPD action plan zone and make appointment with provider if in the yellow zone for 48 hours without improvement Advised patient to engage in light exercise as tolerated 3-5 days a week to aid in the the management of COPD Provided education about and advised patient to utilize infection prevention strategies to reduce risk of respiratory infection Discussed the importance of adequate rest and management of fatigue with COPD Referral made to community resources care guide team for assistance with resources in the community for food insecurities and financial constraints  Screening for signs and symptoms of depression related to chronic disease state  Assessed social determinant of health barriers Discussed smoking cessation and discussing options with the pcp about smoking cessation. The patient states that she has been smoking since she was in the 10th grade of high school.   Symptom Management: Take medications as prescribed   Attend all scheduled provider appointments Call provider office for new concerns or questions  call the Suicide and  Crisis Lifeline: 988 call the [place] National Suicide Prevention Lifeline: 856 138 3941 or TTY: (586) 776-2833 TTY 647-796-1201) to talk to a trained counselor call 1-800-273-TALK (toll free, 24 hour hotline) if experiencing a Mental Health or Behavioral Health Crisis  eliminate smoking in my home identify and remove indoor air pollutants limit outdoor activity during cold weather listen for public air quality announcements every day do breathing exercises every day develop a rescue plan eliminate symptom triggers at home follow rescue plan if symptoms flare-up  Follow Up Plan: Telephone follow up appointment with care management team member scheduled for: 01-20-2023 at 0900 am          The patient verbalized understanding of instructions, educational materials, and care plan provided today and agreed to receive a mailed copy of patient instructions, educational materials, and care plan.  Telephone follow up appointment with care management team member scheduled for: 01-20-2023 at 0900 am  Mindfulness-Based Stress Reduction Mindfulness-based stress reduction (MBSR) is a program that helps people learn to practice mindfulness. Mindfulness is the practice of consciously paying attention to the present moment. MBSR focuses on developing self-awareness, which lets you respond to life stress without judgment or negative feelings. It can be learned and practiced through techniques such as education, breathing exercises, meditation, and yoga. MBSR includes several mindfulness techniques in one program. MBSR works best when you understand the treatment, are willing to try new things, and can commit to spending time practicing what you learn. MBSR training may include learning about: How your feelings, thoughts, and reactions affect your body. New ways to respond to things that  cause negative thoughts to start (triggers). How to notice your thoughts and let go of them. Practicing awareness of  everyday things that you normally do without thinking. The techniques and goals of different types of meditation. What are the benefits of MBSR? MBSR can have many benefits, which include helping you to: Develop self-awareness. This means knowing and understanding yourself. Learn skills and attitudes that help you to take part in your own health care. Learn new ways to care for yourself. Be more accepting about how things are, and let things go. Be less judgmental and approach things with an open mind. Be patient with yourself and trust yourself more. MBSR has also been shown to: Reduce negative emotions, such as sadness, overwhelm, and worry. Improve memory and focus. Change how you sense and react to pain. Boost your body's ability to fight infections. Help you connect better with other people. Improve your sense of well-being. How to practice mindfulness To do a basic awareness exercise: Find a comfortable place to sit. Pay attention to the present moment. Notice your thoughts, feelings, and surroundings just as they are. Avoid judging yourself, your feelings, or your surroundings. Make note of any judgment that comes up and let it go. Your mind may wander, and that is okay. Make note of when your thoughts drift, and return your attention to the present moment. To do basic mindfulness meditation: Find a comfortable place to sit. This may include a stable chair or a firm floor cushion. Sit upright with your back straight. Let your arms fall next to your sides, with your hands resting on your legs. If you are sitting in a chair, rest your feet flat on the floor. If you are sitting on a cushion, cross your legs in front of you. Keep your head in a neutral position with your chin dropped slightly. Relax your jaw and rest the tip of your tongue on the roof of your mouth. Drop your gaze to the floor or close your eyes. Breathe normally and pay attention to your breath. Feel the air moving  in and out of your nose. Feel your belly expanding and relaxing with each breath. Your mind may wander, and that is okay. Make note of when your thoughts drift, and return your attention to your breath. Avoid judging yourself, your feelings, or your surroundings. Make note of any judgment or feelings that come up, let them go, and bring your attention back to your breath. When you are ready, lift your gaze or open your eyes. Pay attention to how your body feels after the meditation. Follow these instructions at home:  Find a local in-person or online MBSR program. Set aside some time regularly for mindfulness practice. Practice every day if you can. Even 10 minutes of practice is helpful. Find a mindfulness practice that works best for you. This may include one or more of the following: Meditation. This involves focusing your mind on a certain thought or activity. Breathing awareness exercises. These help you to stay present by focusing on your breath. Body scan. For this practice, you lie down and pay attention to each part of your body from head to toe. You can identify tension and soreness and consciously relax parts of your body. Yoga. Yoga involves stretching and breathing, and it can improve your ability to move and be flexible. It can also help you to test your body's limits, which can help you release stress. Mindful eating. This way of eating involves focusing on the taste, texture,  color, and smell of each bite of food. This slows down eating and helps you feel full sooner. For this reason, it can be an important part of a weight loss plan. Find a podcast or recording that provides guidance for breathing awareness, body scan, or meditation exercises. You can listen to these any time when you have a free moment to rest without distractions. Follow your treatment plan as told by your health care provider. This may include taking regular medicines and making changes to your diet or lifestyle as  recommended. Where to find more information You can find more information about MBSR from: Your health care provider. Community-based meditation centers or programs. Programs offered near you. Summary Mindfulness-based stress reduction (MBSR) is a program that teaches you how to consciously pay attention to the present moment. It is used to help you deal better with daily stress, feelings, and pain. MBSR focuses on developing self-awareness, which allows you to respond to life stress without judgment or negative feelings. MBSR programs may involve learning different mindfulness practices, such as breathing exercises, meditation, yoga, body scan, or mindful eating. Find a mindfulness practice that works best for you, and set aside time for it on a regular basis. This information is not intended to replace advice given to you by your health care provider. Make sure you discuss any questions you have with your health care provider. Document Revised: 03/21/2021 Document Reviewed: 03/21/2021 Elsevier Patient Education  2023 Elsevier Inc. Major Depressive Disorder, Adult Major depressive disorder (MDD) is a mental health condition. It may also be called clinical depression or unipolar depression. MDD causes symptoms of sadness, hopelessness, and loss of interest in things. These symptoms last most of the day, almost every day, for 2 weeks. MDD can also cause physical symptoms. It can interfere with relationships and activities, such as work, school, and activities that are usually pleasant. MDD may be mild, moderate, or severe. It may be single-episode MDD, which happens once, or recurrent MDD, which may occur many times. What are the causes? The exact cause of this condition is not known. What increases the risk? The following factors may make someone more likely to develop MDD: A family history of depression. Being female. Long-term (chronic) stress, physical illness, other mental health  disorders, or substance misuse. Trauma, including: Family problems. Violence or abuse. Loss of a parent or close family member. Experiencing discrimination. What are the signs or symptoms? The main symptoms of MDD usually include: Constant depressed or irritable mood. A loss of interest in activities. Sleeping or eating too much or too little. Tiredness or low energy. Other symptoms include: Unexplained weight gain or weight loss. Being agitated, restless, or weak. Feeling hopeless, worthless, or guilty. Trouble thinking clearly or making decisions. Thoughts of suicide or harming others. Spending a lot of time alone. Not being able to complete daily tasks or work. Severe symptoms of this condition may include: Psychotic depression.This may include false beliefs or delusions. It may also include seeing, hearing, tasting, smelling, or feeling things that are not real (hallucinations). Chronic depression or persistent depressive disorder. This is low-level depression that lasts for at least 2 years. Melancholic depression, or feeling extremely sad and hopeless. Catatonic depression, which includes trouble speaking and trouble moving. Seasonal depression, which is caused by changes in the seasons. How is this diagnosed? This condition may be diagnosed based on: Your symptoms. Your medical and mental health history. A physical exam. Blood tests to rule out other conditions. MDD is confirmed if  you have either a depressed mood or loss of interest and at least four other MDD symptoms, most of the day, nearly every day, in a 2-week period. How is this treated? This condition is usually treated by mental health professionals, such as psychologists, psychiatrists, and clinical social workers. You may need more than one type of treatment. Treatment may include: Psychotherapy, also called talk therapy or counseling. Types of psychotherapy include: Cognitive behavioral therapy (CBT). This  teaches you to recognize unhealthy feelings, thoughts, and behaviors, and replace them with positive thoughts and actions. Interpersonal therapy (IPT). This helps you to improve the way you communicate with others or relate to them. Family therapy. This treatment includes members of your family. Medicines to treat anxiety and depression. These medicines help to balance the brain chemicals that affect your emotions. Lifestyle changes. You may be asked to: Limit alcohol use and avoid drug use. Get regular exercise. Get plenty of sleep. Make healthy eating choices. Spend more time outdoors. Brain stimulation. This may be done if symptoms are very severe and other treatments have not worked. Examples of this treatment are electroconvulsive therapy and transcranial magnetic stimulation. Follow these instructions at home: Alcohol use Do not drink alcohol if: Your health care provider tells you not to drink. You are pregnant, may be pregnant, or are planning to become pregnant. If you drink alcohol: Limit how much you have to: 0-1 drink a day for women 0-2 drinks a day for men. Know how much alcohol is in your drink. In the [place] one drink equals one 12 oz bottle of beer (355 mL), one 5 oz glass of wine (148 mL), or one 1 oz glass of hard liquor (44 mL). Activity Exercise regularly and spend time outdoors. Find activities that you enjoy and make time to do them. Find healthy ways to manage stress, such as: Meditation or deep breathing. Spending time in nature. Journaling. Return to your normal activities as told by your health care provider. Ask your health care provider what activities are safe for you. General instructions  Take over-the-counter and prescription medicines only as told by your health care provider. Discuss alcohol use with your health care provider. Alcohol can affect any antidepressant medicines you are taking. Discuss any drug use with your health care provider. Eat a  healthy diet and get enough sleep. Consider joining a support group. Your health care provider may be able to recommend one. Keep all follow-up visits. It is important for your health care provider to check on your mood, behavior, and medicines. Your health care provider will make changes to your treatment as needed. Where to find more information [employer] on Mental Illness: nami[employer] of Mental Health: BloggerCourse.com American Psychiatric Association: psychiatry.org Contact a health care provider if: Your symptoms get worse. You develop new symptoms. Get help right away if: You hurt yourself on purpose (self-harm). You have thoughts about hurting yourself or others. You have hallucinations. Get help right away if you feel like you may hurt yourself or others, or have thoughts about taking your own life. Go to your nearest emergency room or: Call 911. Call the National Suicide Prevention Lifeline at (469)462-2999 or 988. This is open 24 hours a day. Text the Crisis Text Line at (402) 803-6037. This information is not intended to replace advice given to you by your health care provider. Make sure you discuss any questions you have with your health care provider. Document Revised: 12/17/2021 Document Reviewed: 12/17/2021 Elsevier Patient Education  2023  Elsevier Inc. Managing the Challenge of Quitting Smoking Quitting smoking is a physical and mental challenge. You may have cravings, withdrawal symptoms, and temptation to smoke. Before quitting, work with your health care provider to make a plan that can help you manage quitting. Making a plan before you quit may keep you from smoking when you have the urge to smoke while trying to quit. How to manage lifestyle changes Managing stress Stress can make you want to smoke, and wanting to smoke may cause stress. It is important to find ways to manage your stress. You could try some of the following: Practice relaxation  techniques. Breathe slowly and deeply, in through your nose and out through your mouth. Listen to music. Soak in a bath or take a shower. Imagine a peaceful place or vacation. Get some support. Talk with family or friends about your stress. Join a support group. Talk with a counselor or therapist. Get some physical activity. Go for a walk, run, or bike ride. Play a favorite sport. Practice yoga.  Medicines Talk with your health care provider about medicines that might help you deal with cravings and make quitting easier for you. Relationships Social situations can be difficult when you are quitting smoking. To manage this, you can: Avoid parties and other social situations where people might be smoking. Avoid alcohol. Leave right away if you have the urge to smoke. Explain to your family and friends that you are quitting smoking. Ask for support and let them know you might be a bit grumpy. Plan activities where smoking is not an option. General instructions Be aware that many people gain weight after they quit smoking. However, not everyone does. To keep from gaining weight, have a plan in place before you quit, and stick to the plan after you quit. Your plan should include: Eating healthy snacks. When you have a craving, it may help to: Eat popcorn, or try carrots, celery, or other cut vegetables. Chew sugar-free gum. Changing how you eat. Eat small portion sizes at meals. Eat 4-6 small meals throughout the day instead of 1-2 large meals a day. Be mindful when you eat. You should avoid watching television or doing other things that might distract you as you eat. Exercising regularly. Make time to exercise each day. If you do not have time for a long workout, do short bouts of exercise for 5-10 minutes several times a day. Do some form of strengthening exercise, such as weight lifting. Do some exercise that gets your heart beating and causes you to breathe deeply, such as walking  fast, running, swimming, or biking. This is very important. Drinking plenty of water or other low-calorie or no-calorie drinks. Drink enough fluid to keep your urine pale yellow.  How to recognize withdrawal symptoms Your body and mind may experience discomfort as you try to get used to not having nicotine in your system. These effects are called withdrawal symptoms. They may include: Feeling hungrier than normal. Having trouble concentrating. Feeling irritable or restless. Having trouble sleeping. Feeling depressed. Craving a cigarette. These symptoms may surprise you, but they are normal to have when quitting smoking. To manage withdrawal symptoms: Avoid places, people, and activities that trigger your cravings. Remember why you want to quit. Get plenty of sleep. Avoid coffee and other drinks that contain caffeine. These may worsen some of your symptoms. How to manage cravings Come up with a plan for how to deal with your cravings. The plan should include the following: A definition of  the specific situation you want to deal with. An activity or action you will take to replace smoking. A clear idea for how this action will help. The name of someone who could help you with this. Cravings usually last for 5-10 minutes. Consider taking the following actions to help you with your plan to deal with cravings: Keep your mouth busy. Chew sugar-free gum. Suck on hard candies or a straw. Brush your teeth. Keep your hands and body busy. Change to a different activity right away. Squeeze or play with a ball. Do an activity or a hobby, such as making bead jewelry, practicing needlepoint, or working with wood. Mix up your normal routine. Take a short exercise break. Go for a quick walk, or run up and down stairs. Focus on doing something kind or helpful for someone else. Call a friend or family member to talk during a craving. Join a support group. Contact a quitline. Where to find  support To get help or find a support group: Call the National Cancer Institute's Smoking Quitline: 1-800-QUIT-NOW 680-862-8970) Text QUIT to SmokefreeTXT: 454098 Where to find more information Visit these websites to find more information on quitting smoking: [place] Department of Health and Human Services: www.smokefree.gov American Lung Association: www.freedomfromsmoking.org Centers for Disease Control and Prevention (CDC): FootballExhibition.com.br American Heart Association: www.heart.org Contact a health care provider if: You want to change your plan for quitting. The medicines you are taking are not helping. Your eating feels out of control or you cannot sleep. You feel depressed or become very anxious. Summary Quitting smoking is a physical and mental challenge. You will face cravings, withdrawal symptoms, and temptation to smoke again. Preparation can help you as you go through these challenges. Try different techniques to manage stress, handle social situations, and prevent weight gain. You can deal with cravings by keeping your mouth busy (such as by chewing gum), keeping your hands and body busy, calling family or friends, or contacting a quitline for people who want to quit smoking. You can deal with withdrawal symptoms by avoiding places where people smoke, getting plenty of rest, and avoiding drinks that contain caffeine. This information is not intended to replace advice given to you by your health care provider. Make sure you discuss any questions you have with your health care provider. Document Revised: 08/02/2021 Document Reviewed: 08/02/2021 Elsevier Patient Education  2023 [employer].

## 2022-12-08 NOTE — Chronic Care Management (AMB) (Signed)
Chronic Care Management   CCM RN Visit Note  12/08/2022 Name: Heather Hill MRN: [id number] DOB: 08/27/1955  Subjective: Heather Hill is a 67 y.o. year old female who is a primary care patient of Cannady, Heather Rank, NP. The patient was referred to the Chronic Care Management team for assistance with care management needs subsequent to provider initiation of CCM services and plan of care.    Today's Visit:  Engaged with patient by telephone for initial visit.     SDOH Interventions Today    Flowsheet Row Most Recent Value  SDOH Interventions   Food Insecurity Interventions AMB Referral, Other (Comment)  [referral to care guides for food resources in the area]  Housing Interventions Intervention Not Indicated  Transportation Interventions Intervention Not Indicated  Utilities Interventions Intervention Not Indicated  Financial Strain Interventions Other (Comment)  [careguide referral, LCSW refrerral and pharm D referral]  Stress Interventions Other (Comment)  [referrals in place for LCSW and pharm D for help with cost constraints. Also a referral for care guides for community resources]  Social Connections Interventions Other (Comment)  [has a good support system with her family and daughter, has 6 siblings, a daughter and three grandchildren. Husband passed in 2022 and son in 2003]         Goals Addressed             This Visit's Progress    CCM Expected Outcome:  Monitor, Self-Manage and Reduce Symptoms [id number]       Current Barriers:  Knowledge Deficits related to resources in the community to help with expressed needs to effectively manage her health and wellbeing Care Coordination needs related to medication cost and mental health resources and support  in a patient with depression Chronic Disease Management support and education needs related to effective management of depression Loss of her husband in 2022, loss of her only son who was in the service in  2003  Planned Interventions: Evaluation of current treatment plan related to depression  and patient's adherence to plan as established by provider Advised patient to call the office for changes in mood, anxiety, depression, or changes in her mental health and well being Provided education to patient re: [email or web address] for support of effective management of the grief process with the loss of her husband in 2022 and her son in 2003 Reviewed medications with patient and discussed compliance. The patient states compliance with medications. Is receptive to working with the pharm D for help with medications cost constraints  Provided patient with mindfulness and stress reduction educational materials related to dealing effectively with anxiety and stress.  Reviewed scheduled/upcoming provider appointments including 01-07-2023 at 1020 am Care Guide referral for resources in the community for financial constraints, utilities, and food resources Social Work referral for help with questions about social support and if she would qualify for [facility] referral for help with cost constraints related to medication cost Discussed plans with patient for ongoing care management follow up and provided patient with direct contact information for care management team Advised patient to discuss changes in mood, anxiety, depression or other mentla health concerns with provider Screening for signs and symptoms of depression related to chronic disease state  Assessed social determinant of health barriers  Symptom Management: Take medications as prescribed   Attend all scheduled provider appointments Call provider office for new concerns or questions  call the Suicide and Crisis Lifeline: 988 call the [place] National Suicide Prevention Lifeline: (301) 596-9127 or TTY: 939-439-4815 TTY (940)381-8572)  to talk to a trained counselor call 1-800-273-TALK (toll free, 24 hour hotline) if experiencing a Mental Health or  Behavioral Health Crisis   Follow Up Plan: Telephone follow up appointment with care management team member scheduled for: 01-20-2023 at 0900 am       CCM:  Maintain, Monitor and Self-Manage Symptoms of COPD       Current Barriers:  Knowledge Deficits related to the importance of smoking cessation in a patient with COPD, support and education for resources to help with management of COPD and other chronic conditions Care Coordination needs related to medication cost and community resources needed in a patient with COPD Chronic Disease Management support and education needs related to Effective management of COPD Current everyday smoker  Planned Interventions: Provided patient with basic written and verbal COPD education on self care/management/and exacerbation prevention Advised patient to track and manage COPD triggers Provided instruction about proper use of medications used for management of COPD including inhalers Advised patient to self assesses COPD action plan zone and make appointment with provider if in the yellow zone for 48 hours without improvement Advised patient to engage in light exercise as tolerated 3-5 days a week to aid in the the management of COPD Provided education about and advised patient to utilize infection prevention strategies to reduce risk of respiratory infection Discussed the importance of adequate rest and management of fatigue with COPD Referral made to community resources care guide team for assistance with resources in the community for food insecurities and financial constraints  Screening for signs and symptoms of depression related to chronic disease state  Assessed social determinant of health barriers Discussed smoking cessation and discussing options with the pcp about smoking cessation. The patient states that she has been smoking since she was in the 10th grade of high school.   Symptom Management: Take medications as prescribed   Attend all  scheduled provider appointments Call provider office for new concerns or questions  call the Suicide and Crisis Lifeline: 988 call the [place] National Suicide Prevention Lifeline: 580 162 5690 or TTY: 762-394-1852 TTY (513)515-6890) to talk to a trained counselor call 1-800-273-TALK (toll free, 24 hour hotline) if experiencing a Mental Health or Behavioral Health Crisis  eliminate smoking in my home identify and remove indoor air pollutants limit outdoor activity during cold weather listen for public air quality announcements every day do breathing exercises every day develop a rescue plan eliminate symptom triggers at home follow rescue plan if symptoms flare-up  Follow Up Plan: Telephone follow up appointment with care management team member scheduled for: 01-20-2023 at 0900 am          Plan:Telephone follow up appointment with care management team member scheduled for:  01-20-2023 at 0900 am  Alto Denver RN, MSN, CCM RN Care Manager  Chronic Care Management Direct Number: [phone]

## 2022-12-08 NOTE — Patient Instructions (Signed)
Please call the care guide team at [phone] if you need to cancel or reschedule your appointment.   If you are experiencing a Mental Health or Behavioral Health Crisis or need someone to talk to, please call the Suicide and Crisis Lifeline: 988 call the [place] National Suicide Prevention Lifeline: (541) 128-3944 or TTY: 209-010-9714 TTY 865-262-3389) to talk to a trained counselor call 1-800-273-TALK (toll free, 24 hour hotline)   Following is a copy of the CCM Program Consent:  CCM service includes personalized support from designated clinical staff supervised by the physician, including individualized plan of care and coordination with other care providers 24/7 contact phone numbers for assistance for urgent and routine care needs. Service will only be billed when office clinical staff spend 20 minutes or more in a month to coordinate care. Only one practitioner may furnish and bill the service in a calendar month. The patient may stop CCM services at amy time (effective at the end of the month) by phone call to the office staff. The patient will be responsible for cost sharing (co-pay) or up to 20% of the service fee (after annual deductible is met)  Following is a copy of your full provider care plan:   Goals Addressed             This Visit's Progress    CCM Expected Outcome:  Monitor, Self-Manage and Reduce Symptoms [id number]       Current Barriers:  Knowledge Deficits related to resources in the community to help with expressed needs to effectively manage her health and wellbeing Care Coordination needs related to medication cost and mental health resources and support  in a patient with depression Chronic Disease Management support and education needs related to effective management of depression Loss of her husband in 2022, loss of her only son who was in the service in 2003  Planned Interventions: Evaluation of current treatment plan related to depression  and patient's  adherence to plan as established by provider Advised patient to call the office for changes in mood, anxiety, depression, or changes in her mental health and well being Provided education to patient re: [email or web address] for support of effective management of the grief process with the loss of her husband in 2022 and her son in 2003 Reviewed medications with patient and discussed compliance. The patient states compliance with medications. Is receptive to working with the pharm D for help with medications cost constraints  Provided patient with mindfulness and stress reduction educational materials related to dealing effectively with anxiety and stress.  Reviewed scheduled/upcoming provider appointments including 01-07-2023 at 1020 am Care Guide referral for resources in the community for financial constraints, utilities, and food resources Social Work referral for help with questions about social support and if she would qualify for [facility] referral for help with cost constraints related to medication cost Discussed plans with patient for ongoing care management follow up and provided patient with direct contact information for care management team Advised patient to discuss changes in mood, anxiety, depression or other mentla health concerns with provider Screening for signs and symptoms of depression related to chronic disease state  Assessed social determinant of health barriers  Symptom Management: Take medications as prescribed   Attend all scheduled provider appointments Call provider office for new concerns or questions  call the Suicide and Crisis Lifeline: 988 call the [place] National Suicide Prevention Lifeline: 705-747-8558 or TTY: (779)398-5027 TTY (269) 636-7417) to talk to a trained counselor call 1-800-273-TALK (toll free, 24 hour hotline) if  experiencing a Mental Health or Behavioral Health Crisis   Follow Up Plan: Telephone follow up appointment with care management team  member scheduled for: 01-20-2023 at 0900 am       CCM:  Maintain, Monitor and Self-Manage Symptoms of COPD       Current Barriers:  Knowledge Deficits related to the importance of smoking cessation in a patient with COPD, support and education for resources to help with management of COPD and other chronic conditions Care Coordination needs related to medication cost and community resources needed in a patient with COPD Chronic Disease Management support and education needs related to Effective management of COPD Current everyday smoker  Planned Interventions: Provided patient with basic written and verbal COPD education on self care/management/and exacerbation prevention Advised patient to track and manage COPD triggers Provided instruction about proper use of medications used for management of COPD including inhalers Advised patient to self assesses COPD action plan zone and make appointment with provider if in the yellow zone for 48 hours without improvement Advised patient to engage in light exercise as tolerated 3-5 days a week to aid in the the management of COPD Provided education about and advised patient to utilize infection prevention strategies to reduce risk of respiratory infection Discussed the importance of adequate rest and management of fatigue with COPD Referral made to community resources care guide team for assistance with resources in the community for food insecurities and financial constraints  Screening for signs and symptoms of depression related to chronic disease state  Assessed social determinant of health barriers Discussed smoking cessation and discussing options with the pcp about smoking cessation. The patient states that she has been smoking since she was in the 10th grade of high school.   Symptom Management: Take medications as prescribed   Attend all scheduled provider appointments Call provider office for new concerns or questions  call the Suicide and  Crisis Lifeline: 988 call the [place] National Suicide Prevention Lifeline: 682-795-1634 or TTY: 484-322-2311 TTY 251-871-5064) to talk to a trained counselor call 1-800-273-TALK (toll free, 24 hour hotline) if experiencing a Mental Health or Behavioral Health Crisis  eliminate smoking in my home identify and remove indoor air pollutants limit outdoor activity during cold weather listen for public air quality announcements every day do breathing exercises every day develop a rescue plan eliminate symptom triggers at home follow rescue plan if symptoms flare-up  Follow Up Plan: Telephone follow up appointment with care management team member scheduled for: 01-20-2023 at 0900 am          The patient verbalized understanding of instructions, educational materials, and care plan provided today and agreed to receive a mailed copy of patient instructions, educational materials, and care plan.  Telephone follow up appointment with care management team member scheduled for: 01-20-2023 at 0900

## 2022-12-08 NOTE — Plan of Care (Signed)
Chronic Care Management Provider Comprehensive Care Plan    12/08/2022 Name: Heather Hill MRN: [id number] DOB: Oct 23, 1955  Referral to Chronic Care Management (CCM) services was placed by Provider:  Aura Dials, NP on Date: 11-26-2022.  Chronic Condition 1: COPD Provider Assessment and Plan  Chronic, noted on initial CT lung screening 01/22/22.  At this time no inhalers or symptoms reported.  Will plan on spirometry at next visit to further assess, discussed with patient.  She agrees with plan of care.  Initiate inhalers as needed.  Recommend complete cessation of smoking.  Referral to CCM team.  Continue yearly lung screening.         Relevant Orders    AMB Referral to Chronic Care Management Services         Expected Outcome/Goals Addressed This Visit (Provider CCM goals/Provider Assessment and plan    CCM (COPD) EXPECTED OUTCOME: MONITOR, SELF-MANAGE AND REDUCE SYMPTOMS OF COPD   Symptom Management Condition 1: Take all medications as prescribed Attend all scheduled provider appointments Call provider office for new concerns or questions  call the Suicide and Crisis Lifeline: 988 call the [place] National Suicide Prevention Lifeline: 514-480-4308 or TTY: 206 619 1549 TTY 959 477 8632) to talk to a trained counselor call 1-800-273-TALK (toll free, 24 hour hotline) if experiencing a Mental Health or Behavioral Health Crisis  eliminate smoking in my home identify and remove indoor air pollutants limit outdoor activity during cold weather listen for public air quality announcements every day do breathing exercises every day develop a rescue plan eliminate symptom triggers at home follow rescue plan if symptoms flare-up  Chronic Condition 2: Depression Provider Assessment and Plan  Chronic, exacerbated as has been off medication since January.  Suspect some PTSD due to past traumatic experience with loss of son & possible Bipolar due to family history.  Denies SI/ HI.   Continue current medication regimen and collaboration with psychiatry -- she had visit upcoming. She is aware all psychiatry medications are to remain to be obtained from her psychiatrist, however will send in refills today on Abilify/Prozac/Trazodone as suspect increased anxiety without medication on board is causing vertigo.  Return in May as scheduled.  Referral to CCM for SW, PharmD, and nursing -- as current social issues present with finances and medication costs.         Relevant Medications    FLUoxetine (PROZAC) 40 MG capsule    traZODone (DESYREL) 100 MG tablet    Other Relevant Orders    AMB Referral to Chronic Care Management Services    Need for financial support      Referral to CCM team and food bag provided today.         Expected Outcome/Goals Addressed This Visit (Provider CCM goals/Provider Assessment and plan   CCM (Depression) EXPECTED OUTCOME: MONITOR, SELF-MANAGE AND REDUCE SYMPTOMS OF Depression   Symptom Management Condition 2: Take all medications as prescribed Attend all scheduled provider appointments Call provider office for new concerns or questions  call the Suicide and Crisis Lifeline: 988 call the [place] National Suicide Prevention Lifeline: 281-132-0354 or TTY: 309 220 3834 TTY 914-131-1178) to talk to a trained counselor call 1-800-273-TALK (toll free, 24 hour hotline) if experiencing a Mental Health or Behavioral Health Crisis   Problem List Patient Active Problem List   Diagnosis Date Noted   Need for financial support 11/26/2022   Vertigo 11/26/2022   Centrilobular emphysema 07/06/2022   Aortic atherosclerosis 07/06/2022   Vitamin D deficiency 07/06/2022   IFG (impaired fasting glucose)  07/02/2021   Nicotine dependence, cigarettes, uncomplicated 11/17/2018   Hyperlipidemia 11/17/2018   Moderate episode of recurrent major depressive disorder 11/16/2018   At high risk for caregiver role strain 11/16/2018   Obesity 11/16/2018     Medication Management  Current Outpatient Medications:    acetaminophen (TYLENOL) 500 MG tablet, Take 2 tablets (1,000 mg total) by mouth every 6 (six) hours as needed for mild pain., Disp: , Rfl:    ARIPiprazole (ABILIFY) 5 MG tablet, Take 1 tablet (5 mg total) by mouth daily., Disp: 30 tablet, Rfl: 0   atorvastatin (LIPITOR) 10 MG tablet, TAKE 1 TABLET(10 MG) BY MOUTH DAILY, Disp: 90 tablet, Rfl: 4   Cholecalciferol 1.25 MG (50000 UT) TABS, Take 1 tablet by mouth once a week., Disp: 12 tablet, Rfl: 4   FLUoxetine (PROZAC) 40 MG capsule, Take 1 capsule (40 mg total) by mouth 2 (two) times daily., Disp: 120 capsule, Rfl: 0   ibuprofen (ADVIL) 600 MG tablet, Take 1 tablet (600 mg total) by mouth every 8 (eight) hours as needed for moderate pain., Disp: 60 tablet, Rfl: 1   LORazepam (ATIVAN) 1 MG tablet, Take 1 mg by mouth every 8 (eight) hours as needed. , Disp: , Rfl:    meclizine (ANTIVERT) 12.5 MG tablet, Take 1 tablet (12.5 mg total) by mouth 3 (three) times daily as needed for dizziness., Disp: 60 tablet, Rfl: 0   traZODone (DESYREL) 100 MG tablet, Take 1 tablet (100 mg total) by mouth at bedtime., Disp: 60 tablet, Rfl: 0  Cognitive Assessment Identity Confirmed: : Name; DOB Cognitive Status: Normal   Functional Assessment Hearing Difficulty or Deaf: no Wear Glasses or Blind: yes Vision Management: wears reading glasses Concentrating, Remembering or Making Decisions Difficulty (CP): no Difficulty Communicating: no Difficulty Eating/Swallowing: no Walking or Climbing Stairs Difficulty: no Dressing/Bathing Difficulty: no Doing Errands Independently Difficulty (such as shopping) (CP): no   Caregiver Assessment  Primary Source of Support/Comfort: child(ren) Name of Support/Comfort Primary Source: [employer] in Home: alone Family Caregiver if Needed: child(ren), adult Primary Roles/Responsibilities: retired   Planned Interventions  Evaluation of current  treatment plan related to depression  and patient's adherence to plan as established by provider Advised patient to call the office for changes in mood, anxiety, depression, or changes in her mental health and well being Provided education to patient re: [email or web address] for support of effective management of the grief process with the loss of her husband in 2022 and her son in 2003 Reviewed medications with patient and discussed compliance. The patient states compliance with medications. Is receptive to working with the pharm D for help with medications cost constraints  Provided patient with mindfulness and stress reduction educational materials related to dealing effectively with anxiety and stress.  Reviewed scheduled/upcoming provider appointments including 01-07-2023 at 1020 am Care Guide referral for resources in the community for financial constraints, utilities, and food resources Social Work referral for help with questions about social support and if she would qualify for [facility] referral for help with cost constraints related to medication cost Discussed plans with patient for ongoing care management follow up and provided patient with direct contact information for care management team Advised patient to discuss changes in mood, anxiety, depression or other mentla health concerns with provider Screening for signs and symptoms of depression related to chronic disease state  Assessed social determinant of health barriers Provided patient with basic written and verbal COPD education on self care/management/and exacerbation prevention Advised patient to track and  manage COPD triggers Provided instruction about proper use of medications used for management of COPD including inhalers Advised patient to self assesses COPD action plan zone and make appointment with provider if in the yellow zone for 48 hours without improvement Advised patient to engage in light exercise as tolerated 3-5  days a week to aid in the the management of COPD Provided education about and advised patient to utilize infection prevention strategies to reduce risk of respiratory infection Discussed the importance of adequate rest and management of fatigue with COPD Referral made to community resources care guide team for assistance with resources in the community for food insecurities and financial constraints  Screening for signs and symptoms of depression related to chronic disease state  Assessed social determinant of health barriers Discussed smoking cessation and discussing options with the pcp about smoking cessation. The patient states that she has been smoking since she was in the 10th grade of high school    Interaction and coordination with outside resources, practitioners, and providers See CCM Referral  Care Plan: Printed and mailed to patient

## 2022-12-10 ENCOUNTER — Ambulatory Visit: Payer: Self-pay | Admitting: *Deleted

## 2022-12-10 NOTE — Patient Outreach (Signed)
  Care Coordination   Initial Visit Note   12/10/2022 Name: ADDISEN CHAPPELLE MRN: [id number] DOB: 1956-02-14  [name] is a 67 y.o. year old female who sees [place], Corrie Dandy T, NP for primary care. I spoke with  [name] by phone today.  What matters to the patients health and wellness today?  Patient discussed that her finances are limited and she is needing resources to assist with food and utility payments. Patient discussed history of loss and will follow up with [facility] on 12/16/22.    Goals Addressed             This Visit's Progress    community resources for food and utilities       Care Coordination Interventions: Food Bank Resources to be mailed to patient's home Utility assistance resources to be provided by mail Patient will follow up with [facility] on 12/16/22 Patient agreeable to ongoing mental health counseling-grief resources to be provided        Patient Stated          SDOH assessments and interventions completed:  Yes  SDOH Interventions Today    Flowsheet Row Most Recent Value  SDOH Interventions   Food Insecurity Interventions Other (Comment)  [will provide food bank resources,]  Housing Interventions Intervention Not Indicated  Transportation Interventions Intervention Not Indicated  Depression Interventions/Treatment  Currently on Treatment  [initial appointment with [facility] previously scheduled for 12/16/22]  Social Connections Interventions Intervention Not Indicated        Care Coordination Interventions:  Yes, provided  Interventions Today    Flowsheet Row Most Recent Value  Chronic Disease   Chronic disease during today's visit Other  [depression]  General Interventions   General Interventions Discussed/Reviewed [employer]  [provided information for the Department of Social Services  for utility assistance]  Mental Health Interventions   Mental Health  Discussed/Reviewed Mental Health Discussed, Coping Strategies, Anxiety, Depression  [Ecouraged follow up with [facility] on 12/16/22-discussed participation in mental health therapy-specifically grief counseling]  Nutrition Interventions   Nutrition Discussed/Reviewed Nutrition Discussed  [will mail patient resources for local food banks]       Follow up plan: Follow up call scheduled for 12/24/22    Encounter Outcome:  Pt. Visit Completed

## 2022-12-10 NOTE — Patient Instructions (Signed)
Visit Information  Thank you for taking time to visit with me today. Please don't hesitate to contact me if I can be of assistance to you.   Following are the goals we discussed today:   Goals Addressed             This Visit's Progress    community resources for food and utilities       Care Coordination Interventions: Food Bank Resources to be mailed to patient's home Utility assistance resources to be provided by mail Patient will follow up with [facility] on 12/16/22 Patient agreeable to ongoing mental health counseling-grief resources to be provided        Patient Stated          Our next appointment is by telephone on 12/24/22 at 10am  Please call the care guide team at [phone] if you need to cancel or reschedule your appointment.   If you are experiencing a Mental Health or Behavioral Health Crisis or need someone to talk to, please call the Suicide and Crisis Lifeline: 988   Patient verbalizes understanding of instructions and care plan provided today and agrees to view in MyChart. Active MyChart status and patient understanding of how to access instructions and care plan via MyChart confirmed with patient.     Telephone follow up appointment with care management team member scheduled for: 12/24/22  Verna Czech, LCSW Clinical Social Worker  [facility] Care Management [phone]

## 2022-12-11 ENCOUNTER — Encounter: Payer: Self-pay | Admitting: *Deleted

## 2022-12-11 NOTE — Patient Outreach (Signed)
  Care Coordination   Follow Up Visit Note   12/11/2022 Name: Heather Hill MRN: [id number] DOB: 03-12-1956  Heather Hill is a 67 y.o. year old female who sees [place], Corrie Dandy T, NP for primary care. I  obtained and mailed out food banks and additional community resource information  What matters to the patients health and wellness today?  Community resources for food and utility assistance    Goals Addressed             This Visit's Progress    community resources for food and utilities       Care Coordination Interventions: Chief of Staff Resources to be mailed to patient's home          SDOH assessments and interventions completed:  No     Care Coordination Interventions:  Yes, provided  Interventions Today    Flowsheet Row Most Recent Value  Chronic Disease   Chronic disease during today's visit Other  [depression]  Education Interventions   Education Provided Provided Printed Education  [Provided information for food assistance and general community resources in the [place] area]  Nutrition Interventions   Nutrition Discussed/Reviewed Supplmental nutrition  [food bank resources provided]       Follow up plan: Follow up call scheduled for 12/24/22    Encounter Outcome:  Pt. Visit Completed

## 2022-12-11 NOTE — Progress Notes (Deleted)
BH MD/PA/NP OP Progress Note  12/11/2022 1:11 PM [name]  MRN:  [id number]  Chief Complaint: No chief complaint on file.  HPI:  Heather Hill is a 67 y.o. year old female with a history of depression, s/p hysterectomy, who is referred for depression.   Reviewed chart from [facility]. Dxx includes MDD, severe, panic disorder  Fluoxetine 40, Abilify 5 mg, trazodone 100 mg Lorazepam,      Visit Diagnosis: No diagnosis found.  Past Psychiatric History:  Outpatient:  Psychiatry admission:  Previous suicide attempt:  Past trials of medication:  History of violence:  History of head injury:   Past Medical History:  Past Medical History:  Diagnosis Date   Anxiety    Depression    Vertigo    none for over 8 yrs   Wears dentures    partial upper    Past Surgical History:  Procedure Laterality Date   AXILLARY LYMPH NODE BIOPSY Left 04/10/2022   Procedure: AXILLARY LYMPH NODE BIOPSY, excisional;  Surgeon: Henrene Dodge, MD;  Location: [facility] ORS;  Service: General;  Laterality: Left;   BREAST BIOPSY Left 03/06/2022   stereo bx, calcs, "COIL" clip-path pending   COLONOSCOPY WITH PROPOFOL N/A 02/02/2019   Procedure: COLONOSCOPY WITH PROPOFOL;  Surgeon: Toney Reil, MD;  Location: [facility];  Service: Endoscopy;  Laterality: N/A;   POLYPECTOMY  02/02/2019   Procedure: POLYPECTOMY;  Surgeon: Toney Reil, MD;  Location: [facility];  Service: Endoscopy;;   TOTAL ABDOMINAL HYSTERECTOMY     TUBAL LIGATION      Family Psychiatric History: ***  Family History:  Family History  Problem Relation Age of Onset   Diabetes Mother    Alzheimer's disease Mother    Hypertension Father    Asthma Father    Diabetes Father    Bipolar disorder Sister    Diabetes Sister    Breast cancer Maternal Aunt    Diabetes Maternal Grandfather    Alzheimer's disease Maternal Grandfather    Heart disease Maternal Grandfather    Asthma  Paternal Grandfather    Diabetes Paternal Grandfather    Hypertension Paternal Grandfather    Diabetes Brother     Social History:  Social History   Socioeconomic History   Marital status: Widowed    Spouse name: Not on file   Number of children: Not on file   Years of education: Not on file   Highest education level: Not on file  Occupational History   Occupation: retired  Tobacco Use   Smoking status: Every Day    Packs/day: 0.50    Years: 48.00    Additional pack years: 0.00    Total pack years: 24.00    Types: Cigarettes   Smokeless tobacco: Never   Tobacco comments:    she currently uses a patch  Vaping Use   Vaping Use: Former  Substance and Sexual Activity   Alcohol use: Yes    Alcohol/week: 2.0 standard drinks of alcohol    Types: 2 Glasses of wine per week   Drug use: Never   Sexual activity: Not Currently  Other Topics Concern   Not on file  Social History Narrative   Not on file   Social Determinants of Health   Financial Resource Strain: High Risk (12/08/2022)   Overall Financial Resource Strain (CARDIA)    Difficulty of Paying Living Expenses: Hard  Food Insecurity: Food Insecurity Present (12/10/2022)   Hunger Vital Sign  Worried About Programme researcher, broadcasting/film/video in the Last Year: Often true    Ran Out of Food in the Last Year: Often true  Transportation Needs: No Transportation Needs (12/10/2022)   PRAPARE - Administrator, Civil Service (Medical): No    Lack of Transportation (Non-Medical): No  Physical Activity: Insufficiently Active (11/26/2022)   Exercise Vital Sign    Days of Exercise per Week: 3 days    Minutes of Exercise per Session: 30 min  Stress: Stress Concern Present (12/10/2022)   [employer] of Occupational Health - Occupational Stress Questionnaire    Feeling of Stress : Very much  Social Connections: Socially Isolated (12/10/2022)   Social Connection and Isolation Panel [NHANES]    Frequency of Communication with  Friends and Family: More than three times a week    Frequency of Social Gatherings with Friends and Family: More than three times a week    Attends Religious Services: Never    Database administrator or Organizations: No    Attends Banker Meetings: Never    Marital Status: Widowed    Allergies:  Allergies  Allergen Reactions   Penicillins Hives   Peanut-Containing Drug Products Itching and Rash    Peanuts    Metabolic Disorder Labs: Lab Results  Component Value Date   HGBA1C 6.1 (H) 07/09/2022   No results found for: "PROLACTIN" Lab Results  Component Value Date   CHOL 132 07/09/2022   TRIG 94 07/09/2022   HDL 47 07/09/2022   VLDL 16 02/28/2019   LDLCALC 67 07/09/2022   LDLCALC 55 01/03/2022   Lab Results  Component Value Date   TSH 0.940 07/09/2022   TSH 2.060 07/01/2021    Therapeutic Level Labs: No results found for: "LITHIUM" No results found for: "VALPROATE" No results found for: "CBMZ"  Current Medications: Current Outpatient Medications  Medication Sig Dispense Refill   acetaminophen (TYLENOL) 500 MG tablet Take 2 tablets (1,000 mg total) by mouth every 6 (six) hours as needed for mild pain.     ARIPiprazole (ABILIFY) 5 MG tablet Take 1 tablet (5 mg total) by mouth daily. 30 tablet 0   atorvastatin (LIPITOR) 10 MG tablet TAKE 1 TABLET(10 MG) BY MOUTH DAILY 90 tablet 4   Cholecalciferol 1.25 MG (50000 UT) TABS Take 1 tablet by mouth once a week. 12 tablet 4   FLUoxetine (PROZAC) 40 MG capsule Take 1 capsule (40 mg total) by mouth 2 (two) times daily. 120 capsule 0   ibuprofen (ADVIL) 600 MG tablet Take 1 tablet (600 mg total) by mouth every 8 (eight) hours as needed for moderate pain. 60 tablet 1   LORazepam (ATIVAN) 1 MG tablet Take 1 mg by mouth every 8 (eight) hours as needed.      meclizine (ANTIVERT) 12.5 MG tablet Take 1 tablet (12.5 mg total) by mouth 3 (three) times daily as needed for dizziness. 60 tablet 0   traZODone (DESYREL) 100 MG  tablet Take 1 tablet (100 mg total) by mouth at bedtime. 60 tablet 0   No current facility-administered medications for this visit.     Musculoskeletal: Strength & Muscle Tone: within normal limits Gait & Station: normal Patient leans: N/A  Psychiatric Specialty Exam: Review of Systems  There were no vitals taken for this visit.There is no height or weight on file to calculate BMI.  General Appearance: {Appearance:22683}  Eye Contact:  {BHH EYE CONTACT:22684}  Speech:  Clear and Coherent  Volume:  Normal  Mood:  {BHH MOOD:22306}  Affect:  {Affect (PAA):22687}  Thought Process:  Coherent  Orientation:  Full (Time, Place, and Person)  Thought Content: Logical   Suicidal Thoughts:  {ST/HT (PAA):22692}  Homicidal Thoughts:  {ST/HT (PAA):22692}  Memory:  Immediate;   Good  Judgement:  {Judgement (PAA):22694}  Insight:  {Insight (PAA):22695}  Psychomotor Activity:  Normal  Concentration:  Concentration: Good and Attention Span: Good  Recall:  Good  Fund of Knowledge: Good  Language: Good  Akathisia:  No  Handed:  Right  AIMS (if indicated): not done  Assets:  Communication Skills Desire for Improvement  ADL's:  Intact  Cognition: WNL  Sleep:  {BHH GOOD/FAIR/POOR:22877}   Screenings: AUDIT    Flowsheet Row Clinical Support from 06/11/2022 in [facility]  Alcohol Use Disorder Identification Test Final Score (AUDIT) 8      GAD-7    Flowsheet Row Office Visit from 11/26/2022 in [facility] Office Visit from 07/09/2022 in [facility] Office Visit from 01/03/2022 in [facility] Office Visit from 07/01/2021 in [facility] Office Visit from 08/05/2019 in [facility]  Total GAD-7 Score PHQ2-9    Flowsheet Row Care Coordination from 12/10/2022 in [facility] Office Visit  from 11/26/2022 in [facility] Office Visit from 07/09/2022 in [facility] Clinical Support from 06/11/2022 in [facility] Office Visit from 01/03/2022 in [facility]  PHQ-2 Total Score 0 5  PHQ-9 Total Score 0 18      Flowsheet Row Pre-Admission Testing 45 from 04/02/2022 in [facility] PRE ADMISSION TESTING  C-SSRS RISK CATEGORY No Risk        Assessment and Plan:  Assessment  Plan   The patient demonstrates the following risk factors for suicide: Chronic risk factors for suicide include: {Chronic Risk Factors for ZOXWRUE:45409811}. Acute risk factors for suicide include: {Acute Risk Factors for BJYNWGN:56213086}. Protective factors for this patient include: {Protective Factors for Suicide VHQI:69629528}. Considering these factors, the overall suicide risk at this point appears to be {Desc; low/moderate/high:110033}. Patient {ACTION; IS/IS UXL:24401027} appropriate for outpatient follow up.   Collaboration of Care: Collaboration of Care: {BH OP Collaboration of Care:21014065}  Patient/Guardian was advised Release of Information must be obtained prior to any record release in order to collaborate their care with an outside provider. Patient/Guardian was advised if they have not already done so to contact the registration department to sign all necessary forms in order for [place] to release information regarding their care.   Consent: Patient/Guardian gives verbal consent for treatment and assignment of benefits for services provided during this visit. Patient/Guardian expressed understanding and agreed to proceed.    [name], MD 12/11/2022, 1:11 PM

## 2022-12-13 NOTE — Progress Notes (Unsigned)
Psychiatric Initial Adult Assessment   Patient Identification: Heather Hill MRN:  [id number] Date of Evaluation:  12/16/2022 Referral Source: Nelwyn Salisbury, NP  Chief Complaint:   Chief Complaint  Patient presents with   Establish Care   Visit Diagnosis:    ICD-10-CM   1. MDD (major depressive disorder), recurrent episode, moderate  F33.1     2. Insomnia, unspecified type  G47.00       History of Present Illness:   Heather Hill is a 67 y.o. year old female with a history of depression,s/p hysterectomy, , who is referred for depression. Reviewed chart from [facility]. Dxx includes MDD, severe, panic disorder  She states that she used to be seen by [facility].  She did not think it was enough.  She struggles with insomnia, difficulty in concentration, and she cannot relax.  She reports loss of her husband from MI in Jan 02, 2021.  He collapsed after he went into the bathroom.  She knew that he was gone, although EMS told her that he had a pulse.  He was suffering from COPD, alcohol use and CHF.  Although he was told to live 5 years if he does not make any changes in his lifestyle, he did not.  She thinks about him every day.  They had a great relationship, stating that he was a school sweetheart. She cannot get together since the loss. She knows that she is really depressed, and does not know how to stop.  She also talks about loss of her son in 2002/01/02 from MVA. she tries to put it to the side.  She is concerned about financial strain.   Depression-she has depressive symptoms as in PHQ-9.  She has initial insomnia, and snores at night.  She feels tired.  She spends time watching TV.  She also enjoys pedicure, and enjoys the gathering with her family/sister once a month.  She reports decrease in appetite, although she had gained weight.  She eats penuts butters, and pretzels instead of meal. She denies SI.   Medication- Fluoxetine 40 mg twice a day , Abilify 5  mg, Lorazepam 1 mg twice a day, trazodone 100 mg (while PCP prescribed those medication to restart, she ran out last week. She reports limited benefit from these medication)  Substance use  Tobacco Alcohol Other substances/  Current 1/2 PPD Two glasses of vodca twice a week denies  Past  Two glasses of vodca every night (denied DUI/DWI) denies  Past Treatment       Support: Household: by herself Marital status: widow Number of children: 1 daughter (her son deceased at age 69 in 01-02-02, transferred back from [place]) Employment: CNA part time, [employer], 3 days a week Education: 12th grade, [employer] for two years   Last PCP / ongoing medical evaluation:     Wt Readings from Last 3 Encounters:  12/16/22 193 lb 3.2 oz (87.6 kg)  11/26/22 191 lb 4.8 oz (86.8 kg)  07/09/22 195 lb 3.2 oz (88.5 kg)      Associated Signs/Symptoms: Depression Symptoms:  depressed mood, insomnia, fatigue, difficulty concentrating, anxiety, (Hypo) Manic Symptoms:   denies decreased need for sleep, euphoria Anxiety Symptoms:  Excessive Worry, Psychotic Symptoms:   denies AH, VH, paranoia PTSD Symptoms: Negative  Past Psychiatric History:  Outpatient:  Psychiatry admission:  Previous suicide attempt: denies (thought of using a loaded gun in the context of losing her son in 1999/01/03)  Past trials of medication:  History  of violence:  History of head injury:   Previous Psychotropic Medications: Yes   Substance Abuse History in the last 12 months:  Yes.    Consequences of Substance Abuse: Mood symptoms as above  Past Medical History:  Past Medical History:  Diagnosis Date   Anxiety    Depression    Vertigo    none for over 8 yrs   Wears dentures    partial upper    Past Surgical History:  Procedure Laterality Date   AXILLARY LYMPH NODE BIOPSY Left 04/10/2022   Procedure: AXILLARY LYMPH NODE BIOPSY, excisional;  Surgeon: Henrene Dodge, MD;  Location: [facility];  Service: General;  Laterality:  Left;   BREAST BIOPSY Left 03/06/2022   stereo bx, calcs, "COIL" clip-path pending   COLONOSCOPY WITH PROPOFOL N/A 02/02/2019   Procedure: COLONOSCOPY WITH PROPOFOL;  Surgeon: Toney Reil, MD;  Location[facility];  Service: Endoscopy;  Laterality: N/A;   POLYPECTOMY  02/02/2019   Procedure: POLYPECTOMY;  Surgeon: Toney Reil, MD;  Location: [facility];  Service: Endoscopy;;   TOTAL ABDOMINAL HYSTERECTOMY     TUBAL LIGATION      Family Psychiatric History: as below  Family History:  Family History  Problem Relation Age of Onset   Diabetes Mother    Alzheimer's disease Mother    Hypertension Father    Asthma Father    Diabetes Father    Bipolar disorder Sister    Diabetes Sister    Breast cancer Maternal Aunt    Diabetes Maternal Grandfather    Alzheimer's disease Maternal Grandfather    Heart disease Maternal Grandfather    Asthma Paternal Grandfather    Diabetes Paternal Grandfather    Hypertension Paternal Grandfather    Diabetes Brother     Social History:   Social History   Socioeconomic History   Marital status: Widowed    Spouse name: Not on file   Number of children: 1   Years of education: Not on file   Highest education level: Some college, no degree  Occupational History   Occupation: retired  Tobacco Use   Smoking status: Every Day    Packs/day: 0.50    Years: 48.00    Additional pack years: 0.00    Total pack years: 24.00    Types: Cigarettes   Smokeless tobacco: Never   Tobacco comments:    she currently uses a patch  Vaping Use   Vaping Use: Former  Substance and Sexual Activity   Alcohol use: Yes    Alcohol/week: 2.0 standard drinks of alcohol    Types: 2 Glasses of wine per week   Drug use: Never   Sexual activity: Not Currently  Other Topics Concern   Not on file  Social History Narrative   Not on file   Social Determinants of Health   Financial Resource Strain: High Risk (12/08/2022)   Overall  Financial Resource Strain (CARDIA)    Difficulty of Paying Living Expenses: Hard  Food Insecurity: Food Insecurity Present (12/10/2022)   Hunger Vital Sign    Worried About Running Out of Food in the Last Year: Often true    Ran Out of Food in the Last Year: Often true  Transportation Needs: No Transportation Needs (12/10/2022)   PRAPARE - Administrator, Civil Service (Medical): No    Lack of Transportation (Non-Medical): No  Physical Activity: Insufficiently Active (11/26/2022)   Exercise Vital Sign    Days of Exercise per Week: 3  days    Minutes of Exercise per Session: 30 min  Stress: Stress Concern Present (12/10/2022)   [employer] of Occupational Health - Occupational Stress Questionnaire    Feeling of Stress : Very much  Social Connections: Socially Isolated (12/10/2022)   Social Connection and Isolation Panel [NHANES]    Frequency of Communication with Friends and Family: More than three times a week    Frequency of Social Gatherings with Friends and Family: More than three times a week    Attends Religious Services: Never    Database administrator or Organizations: No    Attends Banker Meetings: Never    Marital Status: Widowed    Additional Social History: as above  Allergies:   Allergies  Allergen Reactions   Penicillins Hives   Peanut-Containing Drug Products Itching and Rash    Peanuts    Metabolic Disorder Labs: Lab Results  Component Value Date   HGBA1C 6.1 (H) 07/09/2022   No results found for: "PROLACTIN" Lab Results  Component Value Date   CHOL 132 07/09/2022   TRIG 94 07/09/2022   HDL 47 07/09/2022   VLDL 16 02/28/2019   LDLCALC 67 07/09/2022   LDLCALC 55 01/03/2022   Lab Results  Component Value Date   TSH 0.940 07/09/2022    Therapeutic Level Labs: No results found for: "LITHIUM" No results found for: "CBMZ" No results found for: "VALPROATE"  Current Medications: Current Outpatient Medications  Medication  Sig Dispense Refill   acetaminophen (TYLENOL) 500 MG tablet Take 2 tablets (1,000 mg total) by mouth every 6 (six) hours as needed for mild pain.     atorvastatin (LIPITOR) 10 MG tablet TAKE 1 TABLET(10 MG) BY MOUTH DAILY 90 tablet 4   meclizine (ANTIVERT) 12.5 MG tablet Take 1 tablet (12.5 mg total) by mouth 3 (three) times daily as needed for dizziness. 60 tablet 0   [START ON 12/29/2022] sertraline (ZOLOFT) 100 MG tablet Take 1 tablet (100 mg total) by mouth at bedtime. Start after completing 50 mg at night for one week 30 tablet 2   sertraline (ZOLOFT) 25 MG tablet Take 1 tablet (25 mg total) by mouth at bedtime for 7 days, THEN 2 tablets (50 mg total) at bedtime for 7 days. 21 tablet 0   ARIPiprazole (ABILIFY) 5 MG tablet Take 1 tablet (5 mg total) by mouth daily. (Patient not taking: Reported on 12/16/2022) 30 tablet 0   Cholecalciferol 1.25 MG (50000 UT) TABS Take 1 tablet by mouth once a week. 12 tablet 4   FLUoxetine (PROZAC) 40 MG capsule Take 1 capsule (40 mg total) by mouth 2 (two) times daily. (Patient not taking: Reported on 12/16/2022) 120 capsule 0   LORazepam (ATIVAN) 1 MG tablet Take 1 mg by mouth every 8 (eight) hours as needed.  (Patient not taking: Reported on 12/16/2022)     traZODone (DESYREL) 150 MG tablet Take 1 tablet (150 mg total) by mouth at bedtime as needed for sleep. 30 tablet 2   No current facility-administered medications for this visit.    Musculoskeletal: Strength & Muscle Tone: within normal limits Gait & Station: normal Patient leans: N/A  Psychiatric Specialty Exam: Review of Systems  Psychiatric/Behavioral:  Positive for dysphoric mood and sleep disturbance. Negative for agitation, behavioral problems, confusion, decreased concentration, hallucinations, self-injury and suicidal ideas. The patient is nervous/anxious. The patient is not hyperactive.   All other systems reviewed and are negative.   Blood pressure 109/69, pulse 77, temperature (!) 97.1 F  (  36.2 C), temperature source Skin, height 5' 4.02" (1.626 m), weight 193 lb 3.2 oz (87.6 kg).Body mass index is 33.14 kg/m.  General Appearance: Fairly Groomed  Eye Contact:  Good  Speech:  Clear and Coherent  Volume:  Normal  Mood:  Depressed  Affect:  Appropriate, Congruent, and slightly down  Thought Process:  Coherent  Orientation:  Full (Time, Place, and Person)  Thought Content:  Logical  Suicidal Thoughts:  No  Homicidal Thoughts:  No  Memory:  Immediate;   Good  Judgement:  Good  Insight:  Good  Psychomotor Activity:  Normal  Concentration:  Concentration: Good and Attention Span: Good  Recall:  Good  Fund of Knowledge:Good  Language: Good  Akathisia:  No  Handed:  Right  AIMS (if indicated):  not done  Assets:  Communication Skills Desire for Improvement  ADL's:  Intact  Cognition: WNL  Sleep:  Poor   Screenings: AUDIT    Flowsheet Row Clinical Support from 06/11/2022 in [facility]  Alcohol Use Disorder Identification Test Final Score (AUDIT) 8      GAD-7    Flowsheet Row Office Visit from 11/26/2022 in [facility] Office Visit from 07/09/2022 in [facility] Office Visit from 01/03/2022 in [facility] Office Visit from 07/01/2021 in [facility] Office Visit from 08/05/2019 in [facility]  Total GAD-7 Score 21 12 14 19 19       PHQ2-9    Flowsheet Row Office Visit from 12/16/2022 in [facility] Care Coordination from 12/10/2022 in [facility] Office Visit from 11/26/2022 in [facility] Office Visit from 07/09/2022 in [facility] Clinical Support from 06/11/2022 in [facility]  PHQ-2 Total Score 6 6 6 3  0  PHQ-9 Total Score 23 17 22 14  0      Flowsheet Row  Pre-Admission Testing 45 from 04/02/2022 in [facility] PRE [facility]MISSION TESTING  C-SSRS RISK CATEGORY No Risk       Assessment and Plan:  Heather Hill is a 67 y.o. year old female with a history of depression,s/p hysterectomy, , who is referred for depression.   1. MDD (major depressive disorder), recurrent episode, moderate Acute stressors include:  Other stressors include: loss of her husband from MI in 2021, loss of her son (transferred from [place]) in 2000 at age 80, financial strain    History: transferred from [facility], was on fluoxetine 40 mg BID, Abilify 5 mg daily, lorazepam 1 mg twice a day- ran out a week prior to the visit She reports depressive symptoms with limited benefit from her medication regimen for at least the past several months.  While she denies any side effect from those medication, she would like to try another psychotropics.  Will start sertraline with uptitration.  Discussed potential risk of drowsiness and GI symptoms.  She will greatly benefit from supportive therapy/CBT; will make referral.   2. Insomnia, unspecified type  - +snoring, not interested in sleep evaluation/CPAP machine Although she will benefit from sleep evaluation, she is not interested in this.  Will try higher dose of trazodone to target insomnia.  Discussed potential risk of drowsiness.   # history of alcohol use She reports a history of alcohol use when her husband was alive, who also struggled with alcohol-related issues. We will continue to assess  this.   Plan Start sertraline 25 mg at night for one week, then 50 mg at night for one week, then 100 mg at night  Start trazodone trazodone 150 mg at night  Referral to therapy  Next appointment: 7/9 at 3 pm for 30 mins, IP - unable to complete all screenings due to time constraint. Will plan to assess at the next meeting  Past trials of medication: fluoxetine, abilify, lorazepam   The patient demonstrates the following risk  factors for suicide: Chronic risk factors for suicide include: psychiatric disorder of depression . Acute risk factors for suicide include: loss (financial, interpersonal, professional). Protective factors for this patient include: positive social support, responsibility to others (children, family), coping skills, and hope for the future. Considering these factors, the overall suicide risk at this point appears to be low. Patient is appropriate for outpatient follow up.   Collaboration of Care: Other reviewed notes in Epic  Patient/Guardian was advised Release of Information must be obtained prior to any record release in order to collaborate their care with an outside provider. Patient/Guardian was advised if they have not already done so to contact the registration department to sign all necessary forms in order for [place] to release information regarding their care.   Consent: Patient/Guardian gives verbal consent for treatment and assignment of benefits for services provided during this visit. Patient/Guardian expressed understanding and agreed to proceed.   [name], MD 4/23/202412:45 PM

## 2022-12-15 ENCOUNTER — Other Ambulatory Visit: Payer: Self-pay

## 2022-12-15 NOTE — Progress Notes (Signed)
   12/15/2022 Name: Heather Hill MRN: [id number] DOB: 12-01-1955  Heather Hill is a 67 y.o. year old female who presented for a telephone visit.   They were referred to the pharmacist by their PCP for assistance in managing medication access.   Patient is participating in a Managed Medicaid Plan:  No  Subjective: Telephone visit to discuss medication management specific to affordability, adherence, and access Care Team: Primary Care Provider: Marjie Skiff, NP ; Next Scheduled Visit: 01/07/23 Medication Access/Adherence  Current Pharmacy:  [facility] [phone] [place], [place] - [address] AT [facility] [address] [place] [address] Phone: [phone] Fax: [phone]  Patient reports affordability concerns with their medications: No  Patient reports access/transportation concerns to their pharmacy: No  Patient reports adherence concerns with their medications:  Yes   Medication Management: Current adherence strategy: weekly pill organizer  -Patient reports Good adherence to medications -Patient reports the following barriers to adherence: Patient is currently out of fluoxetine and lorazepam prescriptions- states she has not taken since last Thursday -Endorses that all prescriptions are covered by Part D plan at low-cost, affordable copay for her -N[facility] requesting medication list from provider per patient  Objective: Medications Reviewed Today     Reviewed by Heather Hill, [facility] (Pharmacist) on 12/15/22 at 1102  Med List Status: <None>   Medication Order Taking? Sig Documenting Provider Last Dose Status Informant  acetaminophen (TYLENOL) 500 MG tablet [id number] Yes Take 2 tablets (1,000 mg total) by mouth every 6 (six) hours as needed for mild pain. Henrene Dodge, MD Taking Active   ARIPiprazole (ABILIFY) 5 MG tablet [id number] Yes Take 1 tablet (5 mg total) by mouth daily. Heather Hill T, NP Taking Active   atorvastatin (LIPITOR) 10 MG  tablet [id number] Yes TAKE 1 TABLET(10 MG) BY MOUTH DAILY Cannady, Jolene T, NP Taking Active   Cholecalciferol 1.25 MG (50000 UT) TABS [id number] Yes Take 1 tablet by mouth once a week. Heather Hill T, NP Taking Active   FLUoxetine (PROZAC) 40 MG capsule [id number] Yes Take 1 capsule (40 mg total) by mouth 2 (two) times daily. Heather Hill T, NP Taking Active   LORazepam (ATIVAN) 1 MG tablet [id number] Yes Take 1 mg by mouth every 8 (eight) hours as needed.  [provider] Taking Active   meclizine (ANTIVERT) 12.5 MG tablet [id number] Yes Take 1 tablet (12.5 mg total) by mouth 3 (three) times daily as needed for dizziness. Heather Hill T, NP Taking Active   traZODone (DESYREL) 100 MG tablet [id number] Yes Take 1 tablet (100 mg total) by mouth at bedtime. Heather Hill T, NP Taking Active            Assessment/Plan:   Medication Management: - Currently strategy sufficient to maintain appropriate adherence to prescribed medication regimen - Patient has appointment tomorrow morning with psychiatry; recommended to request fluoxetine and lorazepam refills then, as PCP has deferred psychiatry medications to this practice  - Also, suggested requesting medication list print out for DMV at this appointment; but stated to contact PCP office if psychiatry is unable to provide comprehensive list  Follow Up Plan: As needed per PCP recommendations  Heather Hill, PharmD, DPLA

## 2022-12-16 ENCOUNTER — Encounter: Payer: Self-pay | Admitting: Psychiatry

## 2022-12-16 ENCOUNTER — Ambulatory Visit (INDEPENDENT_AMBULATORY_CARE_PROVIDER_SITE_OTHER): Payer: Medicare HMO | Admitting: Psychiatry

## 2022-12-16 ENCOUNTER — Telehealth: Payer: Self-pay

## 2022-12-16 ENCOUNTER — Telehealth: Payer: Self-pay | Admitting: Psychiatry

## 2022-12-16 VITALS — BP 109/69 | HR 77 | Temp 97.1°F | Ht 64.02 in | Wt 193.2 lb

## 2022-12-16 DIAGNOSIS — R69 Illness, unspecified: Secondary | ICD-10-CM | POA: Diagnosis not present

## 2022-12-16 DIAGNOSIS — F331 Major depressive disorder, recurrent, moderate: Secondary | ICD-10-CM

## 2022-12-16 DIAGNOSIS — G4700 Insomnia, unspecified: Secondary | ICD-10-CM

## 2022-12-16 MED ORDER — SERTRALINE HCL 25 MG PO TABS: ORAL_TABLET | ORAL | 0 refills | Status: DC

## 2022-12-16 MED ORDER — SERTRALINE HCL 100 MG PO TABS: 100.0000 mg | ORAL_TABLET | Freq: Every day | ORAL | 2 refills | Status: DC

## 2022-12-16 MED ORDER — TRAZODONE HCL 150 MG PO TABS: 150.0000 mg | ORAL_TABLET | Freq: Every evening | ORAL | 2 refills | Status: DC | PRN

## 2022-12-16 NOTE — Telephone Encounter (Signed)
called patient and let her know that she should have a rx for the prozac at the [facility] pharmacy because it was sent 11-26-22 @ 3:12 for 120 capsules.  Advised patient to call pharmacy and asked if they have the rx on hold.   Pt did say that she needs the ativan sent into the pharmacy

## 2022-12-16 NOTE — Telephone Encounter (Signed)
pt left a message that she needs her ativan and her prozac filled. pt was seen 4-23 and next appt 7-9    I will call patient and let her know she already has the prozac  Prescribing Provider Encounter Provider  Marjie Skiff, NP Marjie Skiff, NP   Outpatient Medication Detail   Disp Refills Start End   FLUoxetine (PROZAC) 40 MG capsule 120 capsule 0 11/26/2022    Sig - Route: Take 1 capsule (40 mg total) by mouth 2 (two) times daily. - Oral   Patient not taking: Reported on 12/16/2022       Sent to pharmacy as: FLUoxetine (PROZAC) 40 MG capsule   E-Prescribing Status: Receipt confirmed by pharmacy (11/26/2022  3:12 PM EDT)       Outpatient Medication Detail   Disp Refills Start End   LORazepam (ATIVAN) 1 MG tablet       Sig - Route: Take 1 mg by mouth every 8 (eight) hours as needed.  - Oral   Patient not taking: Reported on 12/16/2022       Class: Historical Med

## 2022-12-16 NOTE — Telephone Encounter (Signed)
Patient was seen today and called about needing her prescriptions filled.

## 2022-12-16 NOTE — Addendum Note (Signed)
Addended by: [name] on: 12/16/2022 01:32 PM   Modules accepted: Orders

## 2022-12-16 NOTE — Telephone Encounter (Signed)
I discussed with her this morning during the visit the decision to start sertraline INSTEAD of fluoxetine and to hold lorazepam at this time, as she has been without them for at least the past week.

## 2022-12-17 ENCOUNTER — Telehealth: Payer: Self-pay

## 2022-12-17 ENCOUNTER — Other Ambulatory Visit: Payer: Self-pay | Admitting: Nurse Practitioner

## 2022-12-17 NOTE — Telephone Encounter (Signed)
PA initiated for Sertraline 25 mg tablets via CoverMyMeds  PA approved Coverage 08/25/22----08/25/23 Patient made aware

## 2022-12-18 NOTE — Chronic Care Management (AMB) (Signed)
Error in charting.

## 2022-12-18 NOTE — Telephone Encounter (Signed)
Unable to refill per protocol,  rx request is too soon. Last refill 11/26/22 for 60 days.  Requested Prescriptions  Pending Prescriptions Disp Refills   FLUoxetine (PROZAC) 40 MG capsule [Pharmacy Med Name: FLUOXETINE  CAPSULES] 120 capsule 0    Sig: TAKE 1 CAPSULE(40 MG) BY MOUTH TWICE DAILY     Psychiatry:  Antidepressants - SSRI Passed - 12/17/2022  7:22 PM      Passed - Completed PHQ-2 or PHQ-9 in the last 360 days      Passed - Valid encounter within last 6 months    Recent Outpatient Visits           3 weeks ago Moderate episode of recurrent major depressive disorder   [facility] [place], Corrie Dandy T, NP   5 months ago Centrilobular emphysema (HCC)    [facility] [place], [place] T, NP   11 months ago Moderate episode of recurrent major depressive disorder (HCC)   [facility] [place], Hel[place] T, NP   1 year ago Moderate episode of recurrent major depressive disorder (HCC)   [place] [facility] [place], [place] T, NP   3 years ago Moderate episode of recurrent major depressive disorder (HCC)   [facility] [place], Dorie Rank, NP       Future Appointments             In 5 days Cannady, Dorie Rank, NP [place] [facility]   In 2 weeks [place], Dorie Rank, NP [place] [facility]

## 2022-12-19 ENCOUNTER — Other Ambulatory Visit: Payer: Self-pay | Admitting: Nurse Practitioner

## 2022-12-19 NOTE — Telephone Encounter (Signed)
Requested medication (s) are due for refill today: yes  Requested medication (s) are on the active medication list: yes  Last refill:  11/26/22  Future visit scheduled: yes  Notes to clinic:  Unable to refill per protocol, cannot delegate.      Requested Prescriptions  Pending Prescriptions Disp Refills   ARIPiprazole (ABILIFY) 5 MG tablet [Pharmacy Med Name: ARIPIPRAZOLE 5MG  TABLETS] 30 tablet 0    Sig: TAKE 1 TABLET(5 MG) BY MOUTH DAILY     Not Delegated - Psychiatry:  Antipsychotics - Second Generation (Atypical) - aripiprazole Failed - 12/19/2022 12:06 PM      Failed - This refill cannot be delegated      Failed - Lipid Panel in normal range within the last 12 months    Cholesterol, Total  Date Value Ref Range Status  07/09/2022 132 100 - 199 mg/dL Final   Cholesterol Piccolo, Waived  Date Value Ref Range Status  02/28/2019 155 <200 mg/dL Final    Comment:                            Desirable                <200                         Borderline High      200- 239                         High                     >239    LDL Chol Calc (NIH)  Date Value Ref Range Status  07/09/2022 67 0 - 99 mg/dL Final   HDL  Date Value Ref Range Status  07/09/2022 47 >39 mg/dL Final   Triglycerides  Date Value Ref Range Status  07/09/2022 94 0 - 149 mg/dL Final   Triglycerides Piccolo,Waived  Date Value Ref Range Status  02/28/2019 80 <150 mg/dL Final    Comment:                            Normal                   <150                         Borderline High     150 - 199                         High                200 - 499                         Very High                >499          Passed - TSH in normal range and within 360 days    TSH  Date Value Ref Range Status  07/09/2022 0.940 0.450 - 4.500 uIU/mL Final         Passed - Completed PHQ-2 or PHQ-9 in the last 360 days      Passed - Last BP in normal range  BP Readings from Last 1 Encounters:  11/26/22  103/70         Passed - Last Heart Rate in normal range    Pulse Readings from Last 1 Encounters:  11/26/22 84         Passed - Valid encounter within last 6 months    Recent Outpatient Visits           3 weeks ago Moderate episode of recurrent major depressive disorder (HCC)   [place] [facility] [place], [place] T, NP   5 months ago Centrilobular emphysema (HCC)   [place] [facility] [place], Jolene T, NP   11 months ago Moderate episode of recurrent major depressive disorder (HCC)   [place] [facility] [place], [place] T, NP   1 year ago Moderate episode of recurrent major depressive disorder (HCC)   [place] [facility] [place], [place] T, NP   3 years ago Moderate episode of recurrent major depressive disorder (HCC)   [place] [facility] [place], Corrie Dandy T, NP       Future Appointments             In 4 days [place], Dorie Rank, NP [place] [facility]   In 2 weeks [place], [place] T, NP [place] [facility]            Passed - CBC within normal limits and completed in the last 12 months    WBC  Date Value Ref Range Status  07/09/2022 6.1 3.4 - 10.8 x10E3/uL Final  04/04/2022 6.2 4.0 - 10.5 K/uL Final   RBC  Date Value Ref Range Status  07/09/2022 4.73 3.77 - 5.28 x10E6/uL Final  04/04/2022 4.98 3.87 - 5.11 MIL/uL Final   Hemoglobin  Date Value Ref Range Status  07/09/2022 12.7 11.1 - 15.9 g/dL Final   Hematocrit  Date Value Ref Range Status  07/09/2022 39.8 34.0 - 46.6 % Final   MCHC  Date Value Ref Range Status  07/09/2022 31.9 31.5 - 35.7 g/dL Final  16/05/9603 54.0 30.0 - 36.0 g/dL Final   [facility]  Date Value Ref Range Status  07/09/2022 26.8 26.6 - 33.0 pg Final  04/04/2022 26.5 26.0 - 34.0 pg Final   MCV  Date Value Ref Range Status  07/09/2022 84 79 - 97 fL Final   No results found for: "PLTCOUNTKUC", "LABPLAT", "POCPLA" RDW   Date Value Ref Range Status  07/09/2022 14.5 11.7 - 15.4 % Final         Passed - CMP within normal limits and completed in the last 12 months    Albumin  Date Value Ref Range Status  07/09/2022 3.9 3.9 - 4.9 g/dL Final   Alkaline Phosphatase  Date Value Ref Range Status  07/09/2022 113 44 - 121 IU/L Final   ALT  Date Value Ref Range Status  07/09/2022 15 0 - 32 IU/L Final   AST  Date Value Ref Range Status  07/09/2022 13 0 - 40 IU/L Final   BUN  Date Value Ref Range Status  07/09/2022 8 8 - 27 mg/dL Final   Calcium  Date Value Ref Range Status  07/09/2022 9.4 8.7 - 10.3 mg/dL Final   CO2  Date Value Ref Range Status  07/09/2022 28 20 - 29 mmol/L Final   Creatinine, Ser  Date Value Ref Range Status  07/09/2022 0.91 0.57 - 1.00 mg/dL Final   Glucose  Date Value Ref Range  Status  07/09/2022 95 70 - 99 mg/dL Final   Potassium  Date Value Ref Range Status  07/09/2022 4.5 3.5 - 5.2 mmol/L Final   Sodium  Date Value Ref Range Status  07/09/2022 142 134 - 144 mmol/L Final   Bilirubin Total  Date Value Ref Range Status  07/09/2022 0.4 0.0 - 1.2 mg/dL Final   Total Protein  Date Value Ref Range Status  07/09/2022 6.8 6.0 - 8.5 g/dL Final   GFR calc Af Amer  Date Value Ref Range Status  11/09/2019 82 >59 mL/min/1.73 Final   eGFR  Date Value Ref Range Status  07/09/2022 70 >59 mL/min/1.73 Final   GFR calc non Af Amer  Date Value Ref Range Status  11/09/2019 71 >59 mL/min/1.73 Final

## 2022-12-22 ENCOUNTER — Telehealth: Payer: Self-pay | Admitting: Nurse Practitioner

## 2022-12-22 NOTE — Telephone Encounter (Signed)
Returned patient's call and unable to leave VM as mail box is full

## 2022-12-22 NOTE — Telephone Encounter (Signed)
Medication Refill - Medication: ARIPiprazole (ABILIFY) 5 MG tablet [[id number]]  DISCONTINUED   FLUoxetine (PROZAC) 40 MG capsule [[id number]]  DISCONTINUED   Pt reports that she has been out of medication since last week.  Has the patient contacted their pharmacy? Yes.   (Agent: If no, request that the patient contact the pharmacy for the refill. If patient does not wish to contact the pharmacy document the reason why and proceed with request.) (Agent: If yes, when and what did the pharmacy advise?)  Preferred Pharmacy (with phone number or street name): [facility] STORE #[id number] [place], [place] - [address] AT [facility] [address] [place], [place] [address] Phone: [phone]  Fax: [phone]  Has the patient been seen for an appointment in the last year OR does the patient have an upcoming appointment? Yes.    Agent: Please be advised that RX refills may take up to 3 business days. We ask that you follow-up with your pharmacy.

## 2022-12-23 ENCOUNTER — Ambulatory Visit: Payer: Medicare HMO | Admitting: Nurse Practitioner

## 2022-12-23 DIAGNOSIS — F32A Depression, unspecified: Secondary | ICD-10-CM | POA: Diagnosis not present

## 2022-12-23 DIAGNOSIS — J449 Chronic obstructive pulmonary disease, unspecified: Secondary | ICD-10-CM | POA: Diagnosis not present

## 2022-12-23 DIAGNOSIS — F17210 Nicotine dependence, cigarettes, uncomplicated: Secondary | ICD-10-CM | POA: Diagnosis not present

## 2022-12-24 ENCOUNTER — Ambulatory Visit: Payer: Medicare HMO | Admitting: *Deleted

## 2022-12-24 ENCOUNTER — Telehealth: Payer: Self-pay | Admitting: Psychiatry

## 2022-12-24 ENCOUNTER — Ambulatory Visit: Payer: Medicare HMO | Admitting: Nurse Practitioner

## 2022-12-24 NOTE — Progress Notes (Deleted)
There were no vitals taken for this visit.   Subjective:    Patient ID: Heather Hill, female    DOB: 1955/10/20, 67 y.o.   MRN: [id number]  HPI: Heather Hill is a 67 y.o. female  No chief complaint on file.   Relevant past medical, surgical, family and social history reviewed and updated as indicated. Interim medical history since our last visit reviewed. Allergies and medications reviewed and updated.  Review of Systems  Per HPI unless specifically indicated above     Objective:    There were no vitals taken for this visit.  Wt Readings from Last 3 Encounters:  11/26/22 191 lb 4.8 oz (86.8 kg)  07/09/22 195 lb 3.2 oz (88.5 kg)  05/09/22 194 lb (88 kg)    Physical Exam  Results for orders placed or performed in visit on 07/09/22  Comprehensive metabolic panel  Result Value Ref Range   Glucose 95 70 - 99 mg/dL   BUN 8 8 - 27 mg/dL   Creatinine, Ser 7.82 0.57 - 1.00 mg/dL   eGFR 70 >95 AO/ZHY/8.65   BUN/Creatinine Ratio 9 (L) 12 - 28   Sodium 142 134 - 144 mmol/L   Potassium 4.5 3.5 - 5.2 mmol/L   Chloride 103 96 - 106 mmol/L   CO2 28 20 - 29 mmol/L   Calcium 9.4 8.7 - 10.3 mg/dL   Total Protein 6.8 6.0 - 8.5 g/dL   Albumin 3.9 3.9 - 4.9 g/dL   Globulin, Total 2.9 1.5 - 4.5 g/dL   Albumin/Globulin Ratio 1.3 1.2 - 2.2   Bilirubin Total 0.4 0.0 - 1.2 mg/dL   Alkaline Phosphatase 113 44 - 121 IU/L   AST 13 0 - 40 IU/L   ALT 15 0 - 32 IU/L  Lipid Panel w/o Chol/HDL Ratio  Result Value Ref Range   Cholesterol, Total 132 100 - 199 mg/dL   Triglycerides 94 0 - 149 mg/dL   HDL 47 >78 mg/dL   VLDL Cholesterol Cal 18 5 - 40 mg/dL   LDL Chol Calc (NIH) 67 0 - 99 mg/dL  VITAMIN D 25 Hydroxy (Vit-D Deficiency, Fractures)  Result Value Ref Range   Vit D, 25-Hydroxy 16.7 (L) 30.0 - 100.0 ng/mL  HgB A1c  Result Value Ref Range   Hgb A1c MFr Bld 6.1 (H) 4.8 - 5.6 %   Est. average glucose Bld gHb Est-mCnc 128 mg/dL  CBC with Differential/Platelet  Result  Value Ref Range   WBC 6.1 3.4 - 10.8 x10E3/uL   RBC 4.73 3.77 - 5.28 x10E6/uL   Hemoglobin 12.7 11.1 - 15.9 g/dL   Hematocrit 46.9 62.9 - 46.6 %   MCV 84 79 - 97 fL   MCH 26.8 26.6 - 33.0 pg   MCHC 31.9 31.5 - 35.7 g/dL   RDW 52.8 41.3 - 24.4 %   Platelets 276 150 - 450 x10E3/uL   Neutrophils 52 Not Estab. %   Lymphs 34 Not Estab. %   Monocytes 10 Not Estab. %   Eos 3 Not Estab. %   Basos 1 Not Estab. %   Neutrophils Absolute 3.2 1.4 - 7.0 x10E3/uL   Lymphocytes Absolute 2.1 0.7 - 3.1 x10E3/uL   Monocytes Absolute 0.6 0.1 - 0.9 x10E3/uL   EOS (ABSOLUTE) 0.2 0.0 - 0.4 x10E3/uL   Basophils Absolute 0.1 0.0 - 0.2 x10E3/uL   Immature Granulocytes 0 Not Estab. %   Immature Grans (Abs) 0.0 0.0 - 0.1 x10E3/uL  TSH  Result Value  Ref Range   TSH 0.940 0.450 - 4.500 uIU/mL      Assessment & Plan:   Problem List Items Addressed This Visit   None    Follow up plan: No follow-ups on file.

## 2022-12-24 NOTE — Telephone Encounter (Signed)
Could you reach out to the patient. We previously discussed initiating sertraline and hold Abilify. Could you advise her to start the uptitration of sertraline as we discussed, and to refrain from taking Abilify for now? Let me know if she has any concern about this.

## 2022-12-24 NOTE — Patient Outreach (Addendum)
  Care Coordination   Follow Up Visit Note   12/24/2022 Name: Heather Hill MRN: [id number] DOB: 06-Feb-1956  Heather Hill is a 67 y.o. year old female who sees [place], Corrie Dandy T, NP for primary care. I spoke with  Heather Hill by phone today.  What matters to the patients health and wellness today?  Patient discussed having increased anxiety and being out of her medication "Abilify". Patient states that she has an appointment with her PCP to discuss medications. Patient confirms that she did have her initial assessment with her [facility] on 12/16/22.   Goals Addressed             This Visit's Progress    "I ran out of my abilify"       Activities and task to complete in order to accomplish goals.   Patient to follow up with PCP regarding her questions related to her medications Patient to call the mental health crisis line [phone] in the event of a mental health emergency Patient to utilize self care strategies discussed today      community resources for food and utilities       Care Coordination Interventions: Food Bank Resources  mailed to patient's home received          SDOH assessments and interventions completed:  No     Care Coordination Interventions:  Yes, provided  Interventions Today    Flowsheet Row Most Recent Value  Chronic Disease   Chronic disease during today's visit Other  [anxiety]  General Interventions   General Interventions Discussed/Reviewed General Interventions Reviewed, [employer] confirmed that she has received the community resources provided by mail]  Mental Health Interventions   Mental Health Discussed/Reviewed Mental Health Reviewed, Anxiety  [Patient discussed that she buried her mother yesterday-states increased symptoms of anxiety "I ran out of my Abilify" Confirmed seeing psychiateist on 12/16/22-has appointment today with PCP at 3pm- discussed plan to rest before appt sis. to drive]   Nutrition Interventions   Nutrition Discussed/Reviewed Nutrition Reviewed  [resources received]  Pharmacy Interventions   Pharmacy Dicussed/Reviewed Pharmacy Topics Discussed  [Patient states that she ran out of her Abilify, plans on seeing PCP today at 3pm]  Safety Interventions   Safety Discussed/Reviewed Safety Discussed  [Patient denies thoughts of harm to self or others, encouraged to call [phone] in the event of a mental health emergency, suggested that her sister drive her to PCP office if available-patient agreeable]       Follow up plan: Follow up call scheduled for 12/31/22    Encounter Outcome:  Pt. Visit Completed

## 2022-12-24 NOTE — Patient Instructions (Signed)
Visit Information  Thank you for taking time to visit with me today. Please don't hesitate to contact me if I can be of assistance to you.   Following are the goals we discussed today:   Goals Addressed             This Visit's Progress    "I ran out of my abilify"       Activities and task to complete in order to accomplish goals.   Patient to follow up with PCP regarding her questions related to her medications Patient to call the mental health crisis line [phone] in the event of a mental health emergency Patient to utilize self care strategies discussed today      community resources for food and utilities       Care Coordination Interventions: [employer] Resources  mailed to patient's home received          Our next appointment is by telephone on 12/31/22 at 11am  Please call the care guide team at [phone] if you need to cancel or reschedule your appointment.   If you are experiencing a Mental Health or Behavioral Health Crisis or need someone to talk to, please call the Suicide and Crisis Lifeline: 988   Patient verbalizes understanding of instructions and care plan provided today and agrees to view in MyChart. Active MyChart status and patient understanding of how to access instructions and care plan via MyChart confirmed with patient.     Telephone follow up appointment with care management team member scheduled for: 01/03/23  Verna Czech, LCSW Clinical Social Worker  [facility] Care Management [phone]

## 2022-12-24 NOTE — Telephone Encounter (Unsigned)
Patient came in office and asked for a medication refill on her abilify medication to [facility] [address]-please advise

## 2022-12-25 NOTE — Telephone Encounter (Signed)
Called patient several times no answer unable to leave a voicemail due to mailbox being full

## 2022-12-25 NOTE — Telephone Encounter (Signed)
no answer no message could be left mailbox full  

## 2022-12-28 NOTE — Patient Instructions (Signed)
Managing Depression, Adult Depression is a mental health condition that affects your thoughts, feelings, and actions. Being diagnosed with depression can bring you relief if you did not know why you have felt or behaved a certain way. It could also leave you feeling overwhelmed. Finding ways to manage your symptoms can help you feel more positive about your future. How to manage lifestyle changes Being depressed is difficult. Depression can increase the level of everyday stress. Stress can make depression symptoms worse. You may believe your symptoms cannot be managed or will never improve. However, there are many things you can try to help manage your symptoms. There is hope. Managing stress  Stress is your body's reaction to life changes and events, both good and bad. Stress can add to your feelings of depression. Learning to manage your stress can help lessen your feelings of depression. Try some of the following approaches to reducing your stress (stress reduction techniques): Listen to music that you enjoy and that inspires you. Try using a meditation app or take a meditation class. Develop a practice that helps you connect with your spiritual self. Walk in nature, pray, or go to a place of worship. Practice deep breathing. To do this, inhale slowly through your nose. Pause at the top of your inhale for a few seconds and then exhale slowly, letting yourself relax. Repeat this three or four times. Practice yoga to help relax and work your muscles. Choose a stress reduction technique that works for you. These techniques take time and practice to develop. Set aside 5-15 minutes a day to do them. Therapists can offer training in these techniques. Do these things to help manage stress: Keep a journal. Know your limits. Set healthy boundaries for yourself and others, such as saying "no" when you think something is too much. Pay attention to how you react to certain situations. You may not be able to  control everything, but you can change your reaction. Add humor to your life by watching funny movies or shows. Make time for activities that you enjoy and that relax you. Spend less time using electronics, especially at night before bed. The light from screens can make your brain think it is time to get up rather than go to bed.  Medicines Medicines, such as antidepressants, are often a part of treatment for depression. Talk with your pharmacist or health care provider about all the medicines, supplements, and herbal products that you take, their possible side effects, and what medicines and other products are safe to take together. Make sure to report any side effects you may have to your health care provider. Relationships Your health care provider may suggest family therapy, couples therapy, or individual therapy as part of your treatment. How to recognize changes Everyone responds differently to treatment for depression. As you recover from depression, you may start to: Have more interest in doing activities. Feel more hopeful. Have more energy. Eat a more regular amount of food. Have better mental focus. It is important to recognize if your depression is not getting better or is getting worse. The symptoms you had in the beginning may return, such as: Feeling tired. Eating too much or too little. Sleeping too much or too little. Feeling restless, agitated, or hopeless. Trouble focusing or making decisions. Having unexplained aches and pains. Feeling irritable, angry, or aggressive. If you or your family members notice these symptoms coming back, let your health care provider know right away. Follow these instructions at home: Activity Try to   get some form of exercise each day, such as walking. Try yoga, mindfulness, or other stress reduction techniques. Participate in group activities if you are able. Lifestyle Get enough sleep. Cut down on or stop using caffeine, tobacco,  alcohol, and any other harmful substances. Eat a healthy diet that includes plenty of vegetables, fruits, whole grains, low-fat dairy products, and lean protein. Limit foods that are high in solid fats, added sugar, or salt (sodium). General instructions Take over-the-counter and prescription medicines only as told by your health care provider. Keep all follow-up visits. It is important for your health care provider to check on your mood, behavior, and medicines. Your health care provider may need to make changes to your treatment. Where to find support Talking to others  Friends and family members can be sources of support and guidance. Talk to trusted friends or family members about your condition. Explain your symptoms and let them know that you are working with a health care provider to treat your depression. Tell friends and family how they can help. Finances Find mental health providers that fit with your financial situation. Talk with your health care provider if you are worried about access to food, housing, or medicine. Call your insurance company to learn about your co-pays and prescription plan. Where to find more information You can find support in your area from: Anxiety and Depression Association of America (ADAA): adaa.org Mental Health America: mentalhealthamerica.net National Alliance on Mental Illness: nami.org Contact a health care provider if: You stop taking your antidepressant medicines, and you have any of these symptoms: Nausea. Headache. Light-headedness. Chills and body aches. Not being able to sleep (insomnia). You or your friends and family think your depression is getting worse. Get help right away if: You have thoughts of hurting yourself or others. Get help right away if you feel like you may hurt yourself or others, or have thoughts about taking your own life. Go to your nearest emergency room or: Call 911. Call the National Suicide Prevention Lifeline at  1-800-273-8255 or 988. This is open 24 hours a day. Text the Crisis Text Line at 741741. This information is not intended to replace advice given to you by your health care provider. Make sure you discuss any questions you have with your health care provider. Document Revised: 12/17/2021 Document Reviewed: 12/17/2021 Elsevier Patient Education  2023 Elsevier Inc.  

## 2022-12-31 ENCOUNTER — Encounter: Payer: Self-pay | Admitting: Nurse Practitioner

## 2022-12-31 ENCOUNTER — Encounter: Payer: Self-pay | Admitting: *Deleted

## 2022-12-31 ENCOUNTER — Telehealth: Payer: Self-pay | Admitting: *Deleted

## 2022-12-31 ENCOUNTER — Ambulatory Visit (INDEPENDENT_AMBULATORY_CARE_PROVIDER_SITE_OTHER): Payer: Medicare HMO | Admitting: Nurse Practitioner

## 2022-12-31 VITALS — BP 103/71 | HR 78 | Temp 97.7°F | Ht 64.02 in | Wt 191.7 lb

## 2022-12-31 DIAGNOSIS — F331 Major depressive disorder, recurrent, moderate: Secondary | ICD-10-CM

## 2022-12-31 DIAGNOSIS — Z6832 Body mass index (BMI) 32.0-32.9, adult: Secondary | ICD-10-CM

## 2022-12-31 DIAGNOSIS — R7301 Impaired fasting glucose: Secondary | ICD-10-CM

## 2022-12-31 DIAGNOSIS — I700 Atherosclerosis of aorta: Secondary | ICD-10-CM

## 2022-12-31 DIAGNOSIS — E6609 Other obesity due to excess calories: Secondary | ICD-10-CM | POA: Diagnosis not present

## 2022-12-31 DIAGNOSIS — E559 Vitamin D deficiency, unspecified: Secondary | ICD-10-CM

## 2022-12-31 DIAGNOSIS — F17210 Nicotine dependence, cigarettes, uncomplicated: Secondary | ICD-10-CM

## 2022-12-31 DIAGNOSIS — J432 Centrilobular emphysema: Secondary | ICD-10-CM

## 2022-12-31 DIAGNOSIS — E782 Mixed hyperlipidemia: Secondary | ICD-10-CM | POA: Diagnosis not present

## 2022-12-31 NOTE — Assessment & Plan Note (Signed)
BMI 32.89.  Recommended eating smaller high protein, low fat meals more frequently and exercising 30 mins a day 5 times a week with a goal of 10-15lb weight loss in the next 3 months. Patient voiced their understanding and motivation to adhere to these recommendations. ? ?

## 2022-12-31 NOTE — Assessment & Plan Note (Signed)
Chronic, ongoing.  Denies SI/HI.  Does have a safety plan in place if such intrusive thoughts were to present, would reach out to daughter or go to ER.  Currently followed by psychiatry, Dr. Vanetta Shawl.  Reached out to her via secure chat to alert her of patient concerns in regard to current medications and to get follow-up scheduled -- discussed at length with patient important of consistent follow-up with psychiatry.  Continue current regimen at this time and defer changes to psychiatry.

## 2022-12-31 NOTE — Assessment & Plan Note (Signed)
I have recommended complete cessation of tobacco use. I have discussed various options available for assistance with tobacco cessation including over the counter methods (Nicotine gum, patch and lozenges). We also discussed prescription options (Chantix, Nicotine Inhaler / Nasal Spray). The patient is not interested in pursuing any prescription tobacco cessation options at this time.  Continue annual lung screening. 

## 2022-12-31 NOTE — Assessment & Plan Note (Signed)
Chronic, ongoing with risk factors.  Continue statin and adjust dose as needed. Lipid panel and CMP today.  Return in 6 months for follow-up. ?

## 2022-12-31 NOTE — Assessment & Plan Note (Signed)
Ongoing, noted on past labs. Recheck today and recommend she continue Vitamin D3 2000 units daily.  DEXA was normal.

## 2022-12-31 NOTE — Progress Notes (Signed)
BP 103/71   Pulse 78   Temp 97.7 F (36.5 C) (Oral)   Ht 5' 4.02" (1.626 m)   Wt 191 lb 11.2 oz (87 kg)   SpO2 99%   BMI 32.89 kg/m    Subjective:    Patient ID: Heather Hill, female    DOB: 08/17/1956, 67 y.o.   MRN: [id number]  HPI: Heather Hill is a 67 y.o. female  Chief Complaint  Patient presents with   Depression   Hyperlipidemia   Emphysema   Paperwork    For DMV   COPD Continues to smoke 1/2 PPD and not interested in quitting.  Started smoking when in 9th grade.  Lung screening on 01/22/22 noted mild centrilobular emphysema and aortic atherosclerosis.    History of low Vitamin D levels, takes supplement = DEXA normal July 2023.  COPD status: stable Satisfied with current treatment?: yes Oxygen use: no Dyspnea frequency: no Cough frequency: no Rescue inhaler frequency:   Limitation of activity: no Productive cough: none Last Spirometry: none Pneumovax: Up to Date Influenza: Up to Date   HYPERLIPIDEMIA Continues on Atorvastatin 10 MG daily.  History of elevation in A1c, diet focused. Hyperlipidemia status: fair compliance Satisfied with current treatment?  yes Side effects:  no Medication compliance: fair compliance Past cholesterol meds: Atorvastatin Supplements: none Aspirin:  no The 10-year ASCVD risk score (Arnett DK, et al., 2019) is: 7.3%   Values used to calculate the score:     Age: 28 years     Sex: Female     Is Non-Hispanic African American: Yes     Diabetic: No     Tobacco smoker: Yes     Systolic Blood Pressure: 103 mmHg     Is BP treated: No     HDL Cholesterol: 47 mg/dL     Total Cholesterol: 132 mg/dL Chest pain:  no Coronary artery disease:  no Family history CAD:  yes Family history early CAD:  no   DEPRESSION/ANXIETY Is following with Dr. Vanetta Shawl in [place] for psychiatry with recent medication changes made - was initial visit with her.  Stopped Abilify and started Sertraline and taking Trazodone.  Previously  was taking Abilify, Prozac, Prazosin, Trazodone with [facility]. Her husband passed away last year, which was difficult for her.   She reports missing Abilify and kind of feeling hooked on it. Mood status: stable Satisfied with current treatment?: no Symptom severity: mild  Duration of current treatment : chronic Side effects: no Medication compliance: good compliance Psychotherapy/counseling: not current Previous psychiatric medications: multiple she does not recall = in chart Abilify, Prozac, Prazosin, Trazodone Depressed mood: yes, improving Anxious mood: yes, improving Anhedonia: no Significant weight loss or gain: no Insomnia: yes hard to fall asleep -- taking Trazodone Fatigue: no Feelings of worthlessness or guilt: no Impaired concentration/indecisiveness: no Suicidal ideations: no Hopelessness: no Crying spells: no    12/31/2022    9:19 AM 12/16/2022   10:59 AM 12/10/2022   11:22 AM 11/26/2022    2:09 PM 07/09/2022   10:15 AM  Depression screen PHQ 2/9  Decreased Interest 3 3 3 3 1   Down, Depressed, Hopeless 3 3 3 3 2   PHQ - 2 Score 6 6 6 6 3   Altered sleeping 3 3 3 3 3   Tired, decreased energy 3 3 3 3 3   Change in appetite 3 3 3 3 3   Feeling bad or failure about yourself  2 2 1 1  0  Trouble concentrating 2 3  1 3 2   Moving slowly or fidgety/restless 2 3 0 3 0  Suicidal thoughts 1 0 0 0 0  PHQ-9 Score 22 23 17 22 14   Difficult doing work/chores Somewhat difficult Extremely dIfficult  Very difficult Somewhat difficult       12/31/2022    9:20 AM 12/16/2022   11:00 AM 11/26/2022    2:09 PM 07/09/2022   10:16 AM  GAD 7 : Generalized Anxiety Score  Nervous, Anxious, on Edge 3 3 3 2   Control/stop worrying 3 3 3 2   Worry too much - different things 3 3 3 2   Trouble relaxing 3 3 3 3   Restless 2  3 0  Easily annoyed or irritable 3 1 3  0  Afraid - awful might happen 2 3 3 3   Total GAD 7 Score 19  21 12   Anxiety Difficulty Somewhat difficult  Extremely difficult  Somewhat difficult      Relevant past medical, surgical, family and social history reviewed and updated as indicated. Interim medical history since our last visit reviewed. Allergies and medications reviewed and updated.  Review of Systems  Constitutional:  Negative for activity change, appetite change, diaphoresis, fatigue and fever.  Respiratory:  Negative for cough, chest tightness and shortness of breath.   Cardiovascular:  Negative for chest pain, palpitations and leg swelling.  Gastrointestinal: Negative.   Endocrine: Negative for cold intolerance, heat intolerance, polydipsia, polyphagia and polyuria.  Neurological: Negative.   Psychiatric/Behavioral:  Positive for decreased concentration and sleep disturbance. Negative for self-injury and suicidal ideas. The patient is nervous/anxious.     Per HPI unless specifically indicated above     Objective:    BP 103/71   Pulse 78   Temp 97.7 F (36.5 C) (Oral)   Ht 5' 4.02" (1.626 m)   Wt 191 lb 11.2 oz (87 kg)   SpO2 99%   BMI 32.89 kg/m   Wt Readings from Last 3 Encounters:  12/31/22 191 lb 11.2 oz (87 kg)  12/16/22 193 lb 3.2 oz (87.6 kg)  11/26/22 191 lb 4.8 oz (86.8 kg)    Physical Exam Vitals and nursing note reviewed.  Constitutional:      General: She is awake. She is not in acute distress.    Appearance: She is well-developed and well-groomed. She is obese. She is not ill-appearing or toxic-appearing.  HENT:     Head: Normocephalic.     Right Ear: Hearing normal.     Left Ear: Hearing normal.  Eyes:     General: Lids are normal.        Right eye: No discharge.        Left eye: No discharge.     Conjunctiva/sclera: Conjunctivae normal.  Neck:     Thyroid: No thyromegaly.     Vascular: No carotid bruit.  Cardiovascular:     Rate and Rhythm: Normal rate.     Heart sounds: Normal heart sounds.     No systolic murmur is present.     No diastolic murmur is present.  Pulmonary:     Effort: Pulmonary effort  is normal. No accessory muscle usage or respiratory distress.  Abdominal:     General: Bowel sounds are normal. There is no distension.     Palpations: Abdomen is soft.     Tenderness: There is no abdominal tenderness.  Musculoskeletal:        General: Normal range of motion.     Cervical back: Normal range of motion.  Right lower leg: No edema.     Left lower leg: No edema.  Neurological:     Mental Status: She is alert and oriented to person, place, and time.  Psychiatric:        Attention and Perception: Attention normal.        Mood and Affect: Mood normal.        Speech: Speech normal.        Behavior: Behavior normal. Behavior is cooperative.    Results for orders placed or performed in visit on 07/09/22  Comprehensive metabolic panel  Result Value Ref Range   Glucose 95 70 - 99 mg/dL   BUN 8 8 - 27 mg/dL   Creatinine, Ser 4.09 0.57 - 1.00 mg/dL   eGFR 70 >81 XB/JYN/8.29   BUN/Creatinine Ratio 9 (L) 12 - 28   Sodium 142 134 - 144 mmol/L   Potassium 4.5 3.5 - 5.2 mmol/L   Chloride 103 96 - 106 mmol/L   CO2 28 20 - 29 mmol/L   Calcium 9.4 8.7 - 10.3 mg/dL   Total Protein 6.8 6.0 - 8.5 g/dL   Albumin 3.9 3.9 - 4.9 g/dL   Globulin, Total 2.9 1.5 - 4.5 g/dL   Albumin/Globulin Ratio 1.3 1.2 - 2.2   Bilirubin Total 0.4 0.0 - 1.2 mg/dL   Alkaline Phosphatase 113 44 - 121 IU/L   AST 13 0 - 40 IU/L   ALT 15 0 - 32 IU/L  Lipid Panel w/o Chol/HDL Ratio  Result Value Ref Range   Cholesterol, Total 132 100 - 199 mg/dL   Triglycerides 94 0 - 149 mg/dL   HDL 47 >56 mg/dL   VLDL Cholesterol Cal 18 5 - 40 mg/dL   LDL Chol Calc (NIH) 67 0 - 99 mg/dL  VITAMIN D 25 Hydroxy (Vit-D Deficiency, Fractures)  Result Value Ref Range   Vit D, 25-Hydroxy 16.7 (L) 30.0 - 100.0 ng/mL  HgB A1c  Result Value Ref Range   Hgb A1c MFr Bld 6.1 (H) 4.8 - 5.6 %   Est. average glucose Bld gHb Est-mCnc 128 mg/dL  CBC with Differential/Platelet  Result Value Ref Range   WBC 6.1 3.4 - 10.8  x10E3/uL   RBC 4.73 3.77 - 5.28 x10E6/uL   Hemoglobin 12.7 11.1 - 15.9 g/dL   Hematocrit 21.3 08.6 - 46.6 %   MCV 84 79 - 97 fL   MCH 26.8 26.6 - 33.0 pg   MCHC 31.9 31.5 - 35.7 g/dL   RDW 57.8 46.9 - 62.9 %   Platelets 276 150 - 450 x10E3/uL   Neutrophils 52 Not Estab. %   Lymphs 34 Not Estab. %   Monocytes 10 Not Estab. %   Eos 3 Not Estab. %   Basos 1 Not Estab. %   Neutrophils Absolute 3.2 1.4 - 7.0 x10E3/uL   Lymphocytes Absolute 2.1 0.7 - 3.1 x10E3/uL   Monocytes Absolute 0.6 0.1 - 0.9 x10E3/uL   EOS (ABSOLUTE) 0.2 0.0 - 0.4 x10E3/uL   Basophils Absolute 0.1 0.0 - 0.2 x10E3/uL   Immature Granulocytes 0 Not Estab. %   Immature Grans (Abs) 0.0 0.0 - 0.1 x10E3/uL  TSH  Result Value Ref Range   TSH 0.940 0.450 - 4.500 uIU/mL      Assessment & Plan:   Problem List Items Addressed This Visit       Cardiovascular and Mediastinum   Aortic atherosclerosis (HCC)    Chronic, noted on imaging 01/04/22.  Recommend she continue statin therapy and  adjust as needed.  Take Baby ASA 81 MG daily.      Relevant Orders   Comprehensive metabolic panel   Lipid Panel w/o Chol/HDL Ratio     Respiratory   Centrilobular emphysema (HCC) - Primary    Chronic, noted on initial CT lung screening 01/22/22.  At this time no inhalers or symptoms reported.  Will plan on spirometry at next visit to further assess, discussed with patient.  She agrees with plan of care.  Initiate inhalers as needed.  Recommend complete cessation of smoking.  Continue yearly lung screening, due end of May 2024.        Endocrine   IFG (impaired fasting glucose)    Noted on labs and did take Abilify for mood at time, will check A1c today.  Recommend ongoing diet focus.      Relevant Orders   HgB A1c     Other   Hyperlipidemia    Chronic, ongoing with risk factors.  Continue statin and adjust dose as needed. Lipid panel and CMP today.  Return in 6 months for follow-up.      Relevant Orders   Comprehensive  metabolic panel   Lipid Panel w/o Chol/HDL Ratio   Moderate episode of recurrent major depressive disorder (HCC)    Chronic, ongoing.  Denies SI/HI.  Does have a safety plan in place if such intrusive thoughts were to present, would reach out to daughter or go to ER.  Currently followed by psychiatry, Dr. Vanetta Shawl.  Reached out to her via secure chat to alert her of patient concerns in regard to current medications and to get follow-up scheduled -- discussed at length with patient important of consistent follow-up with psychiatry.  Continue current regimen at this time and defer changes to psychiatry.        Nicotine dependence, cigarettes, uncomplicated    I have recommended complete cessation of tobacco use. I have discussed various options available for assistance with tobacco cessation including over the counter methods (Nicotine gum, patch and lozenges). We also discussed prescription options (Chantix, Nicotine Inhaler / Nasal Spray). The patient is not interested in pursuing any prescription tobacco cessation options at this time.  Continue annual lung screening.       Obesity    BMI 32.89.  Recommended eating smaller high protein, low fat meals more frequently and exercising 30 mins a day 5 times a week with a goal of 10-15lb weight loss in the next 3 months. Patient voiced their understanding and motivation to adhere to these recommendations.       Vitamin D deficiency    Ongoing, noted on past labs. Recheck today and recommend she continue Vitamin D3 2000 units daily.  DEXA was normal.      Relevant Orders   VITAMIN D 25 Hydroxy (Vit-D Deficiency, Fractures)     Follow up plan: Return in about 6 months (around 07/13/2023) for Annual physical after 07/10/2023 -- spirometry needed.

## 2022-12-31 NOTE — Assessment & Plan Note (Signed)
Noted on labs and did take Abilify for mood at time, will check A1c today.  Recommend ongoing diet focus.

## 2022-12-31 NOTE — Assessment & Plan Note (Addendum)
Chronic, noted on imaging 01/04/22.  Recommend she continue statin therapy and adjust as needed.  Take Baby ASA 81 MG daily.

## 2022-12-31 NOTE — Assessment & Plan Note (Signed)
Chronic, noted on initial CT lung screening 01/22/22.  At this time no inhalers or symptoms reported.  Will plan on spirometry at next visit to further assess, discussed with patient.  She agrees with plan of care.  Initiate inhalers as needed.  Recommend complete cessation of smoking.  Continue yearly lung screening, due end of May 2024.

## 2022-12-31 NOTE — Patient Outreach (Signed)
  Care Coordination   12/31/2022 Name: Heather Hill MRN: [id number] DOB: [birth year]   Care Coordination Outreach Attempts:  An unsuccessful telephone outreach was attempted today to offer the patient information about available care coordination services.  Follow Up Plan:  Additional outreach attempts will be made to offer the patient care coordination information and services.   Encounter Outcome:  Pt. Visit Completed   Care Coordination Interventions:  No, not indicated    Jene Huq, LCSW Clinical Social Worker  [facility] [phone]

## 2023-01-01 LAB — COMPREHENSIVE METABOLIC PANEL
ALT: 20 IU/L (ref 0–32)
AST: 13 IU/L (ref 0–40)
Albumin/Globulin Ratio: 1.6 (ref 1.2–2.2)
Albumin: 4.2 g/dL (ref 3.9–4.9)
Alkaline Phosphatase: 108 IU/L (ref 44–121)
BUN/Creatinine Ratio: 17 (ref 12–28)
BUN: 14 mg/dL (ref 8–27)
Bilirubin Total: 0.3 mg/dL (ref 0.0–1.2)
CO2: 25 mmol/L (ref 20–29)
Calcium: 9.2 mg/dL (ref 8.7–10.3)
Chloride: 102 mmol/L (ref 96–106)
Creatinine, Ser: 0.84 mg/dL (ref 0.57–1.00)
Globulin, Total: 2.7 g/dL (ref 1.5–4.5)
Glucose: 106 mg/dL — ABNORMAL HIGH (ref 70–99)
Potassium: 4.1 mmol/L (ref 3.5–5.2)
Sodium: 142 mmol/L (ref 134–144)
Total Protein: 6.9 g/dL (ref 6.0–8.5)
eGFR: 77 mL/min/{1.73_m2} (ref >59)

## 2023-01-01 LAB — LIPID PANEL W/O CHOL/HDL RATIO
Cholesterol, Total: 118 mg/dL (ref 100–199)
HDL: 49 mg/dL (ref >39)
LDL Chol Calc (NIH): 54 mg/dL (ref 0–99)
Triglycerides: 75 mg/dL (ref 0–149)
VLDL Cholesterol Cal: 15 mg/dL (ref 5–40)

## 2023-01-01 LAB — HEMOGLOBIN A1C
Est. average glucose Bld gHb Est-mCnc: 128 mg/dL
Hgb A1c MFr Bld: 6.1 % — ABNORMAL HIGH (ref 4.8–5.6)

## 2023-01-01 LAB — VITAMIN D 25 HYDROXY (VIT D DEFICIENCY, FRACTURES): Vit D, 25-Hydroxy: 54.8 ng/mL (ref 30.0–100.0)

## 2023-01-01 NOTE — Progress Notes (Signed)
Contacted via MyChart   Good evening Heather Hill, your labs have returned and overall remain stable with exception of A1c.  This remains in prediabetic range.  The A1c is the diabetes testing we talked about, this looks at your blood sugars over the past 3 months and turns the average into a number.  Your number is 6.1%, meaning you are prediabetic.  Any number 5.7 to 6.4 is considered prediabetes and any number 6.5 or greater is considered diabetes.   I would recommend heavy focus on decreasing foods high in sugar and your intake of things like bread products, pasta, and rice.  The American Diabetes Association online has a large amount of information on diet changes to make.  We will recheck this number in 3-6 months to ensure you are not continuing to trend upwards and move into diabetes.  Have a good day.  Any questions? Keep being amazing!!  Thank you for allowing me to participate in your care.  I appreciate you. Kindest regards, Gillis Boardley

## 2023-01-07 ENCOUNTER — Ambulatory Visit: Payer: Medicare PPO | Admitting: Nurse Practitioner

## 2023-01-09 ENCOUNTER — Ambulatory Visit: Payer: Medicare HMO | Admitting: Nurse Practitioner

## 2023-01-10 NOTE — Progress Notes (Unsigned)
BH MD/PA/NP OP Progress Note  01/13/2023 11:16 AM Heather Hill  MRN:  [id number]  Chief Complaint:  Chief Complaint  Patient presents with   Follow-up   HPI:  This is a follow-up appointment for depression and insomnia.  She states that her mood has been better, and she feels almost back to herself.  However, she is unable to clean the house.  She does not feel like doing this.  Although she used to be very active, and enjoyed working on flowerbeds, she is not doing this anymore.  Although she denies any issues at work, she spends time watching TV or just lying down when she does not work.  She thinks she was "hooked" on Abilify, and was not her self, feeling worse when she discontinued this medication.  She feels better now, and she thanked Conservation officer, nature to discontinue this medication. The patient has mood symptoms as in PHQ-9/GAD-7.  She has initial and middle insomnia.  She snores at night.  Although her body is tired, she tends to think about things.  She denies SI.  She agrees with the plan as below.  Substance use   Tobacco Alcohol Other substances/  Current 1/2 PPD Two glasses of vodca twice a week denies  Past   Two glasses of vodca every night (denied DUI/DWI) denies  Past Treatment           Wt Readings from Last 3 Encounters:  01/13/23 193 lb 6.4 oz (87.7 kg)  12/31/22 191 lb 11.2 oz (87 kg)  12/16/22 193 lb 3.2 oz (87.6 kg)     Support: Household: by herself Marital status: widow Number of children: 1 daughter (her son deceased at age 19 in 01-30-2002, transferred back from [place]) Employment: CNA part time, [employer], 3 days a week Education: 12th grade, [employer] for two years   Last PCP / ongoing medical evaluation:   Visit Diagnosis:    ICD-10-CM   1. MDD (major depressive disorder), recurrent episode, moderate (HCC)  F33.1     2. Insomnia, unspecified type  G47.00 Ambulatory referral to Pulmonology      Past Psychiatric History: Please see initial evaluation  for full details. I have reviewed the history. No updates at this time.     Past Medical History:  Past Medical History:  Diagnosis Date   Anxiety    Depression    Vertigo    none for over 8 yrs   Wears dentures    partial upper    Past Surgical History:  Procedure Laterality Date   AXILLARY LYMPH NODE BIOPSY Left 04/10/2022   Procedure: AXILLARY LYMPH NODE BIOPSY, excisional;  Surgeon: Henrene Dodge, MD;  Location: [facility];  Service: General;  Laterality: Left;   BREAST BIOPSY Left 03/06/2022   stereo bx, calcs, "COIL" clip-path pending   COLONOSCOPY WITH PROPOFOL N/A 02/02/2019   Procedure: COLONOSCOPY WITH PROPOFOL;  Surgeon: Toney Reil, MD;  Location: [facility];  Service: Endoscopy;  Laterality: N/A;   POLYPECTOMY  02/02/2019   Procedure: POLYPECTOMY;  Surgeon: Toney Reil, MD;  Location: [facility];  Service: Endoscopy;;   TOTAL ABDOMINAL HYSTERECTOMY     TUBAL LIGATION      Family Psychiatric History: Please see initial evaluation for full details. I have reviewed the history. No updates at this time.     Family History:  Family History  Problem Relation Age of Onset   Diabetes Mother    Alzheimer's disease Mother  Hypertension Father    Asthma Father    Diabetes Father    Bipolar disorder Sister    Diabetes Sister    Breast cancer Maternal Aunt    Diabetes Maternal Grandfather    Alzheimer's disease Maternal Grandfather    Heart disease Maternal Grandfather    Asthma Paternal Grandfather    Diabetes Paternal Grandfather    Hypertension Paternal Grandfather    Diabetes Brother     Social History:  Social History   Socioeconomic History   Marital status: Widowed    Spouse name: Not on file   Number of children: 1   Years of education: Not on file   Highest education level: Some college, no degree  Occupational History   Occupation: retired  Tobacco Use   Smoking status: Every Day    Packs/day: 0.50    Years:  48.00    Additional pack years: 0.00    Total pack years: 24.00    Types: Cigarettes   Smokeless tobacco: Never   Tobacco comments:    she currently uses a patch  Vaping Use   Vaping Use: Former  Substance and Sexual Activity   Alcohol use: Yes    Alcohol/week: 2.0 standard drinks of alcohol    Types: 2 Glasses of wine per week   Drug use: Never   Sexual activity: Not Currently  Other Topics Concern   Not on file  Social History Narrative   Not on file   Social Determinants of Health   Financial Resource Strain: High Risk (12/08/2022)   Overall Financial Resource Strain (CARDIA)    Difficulty of Paying Living Expenses: Hard  Food Insecurity: Food Insecurity Present (12/10/2022)   Hunger Vital Sign    Worried About Running Out of Food in the Last Year: Often true    Ran Out of Food in the Last Year: Often true  Transportation Needs: No Transportation Needs (12/10/2022)   PRAPARE - Administrator, Civil Service (Medical): No    Lack of Transportation (Non-Medical): No  Physical Activity: Insufficiently Active (11/26/2022)   Exercise Vital Sign    Days of Exercise per Week: 3 days    Minutes of Exercise per Session: 30 min  Stress: Stress Concern Present (12/10/2022)   [employer] of Occupational Health - Occupational Stress Questionnaire    Feeling of Stress : Very much  Social Connections: Socially Isolated (12/10/2022)   Social Connection and Isolation Panel [NHANES]    Frequency of Communication with Friends and Family: More than three times a week    Frequency of Social Gatherings with Friends and Family: More than three times a week    Attends Religious Services: Never    Database administrator or Organizations: No    Attends Banker Meetings: Never    Marital Status: Widowed    Allergies:  Allergies  Allergen Reactions   Penicillins Hives   Peanut-Containing Drug Products Itching and Rash    Peanuts    Metabolic Disorder Labs: Lab  Results  Component Value Date   HGBA1C 6.1 (H) 12/31/2022   No results found for: "PROLACTIN" Lab Results  Component Value Date   CHOL 118 12/31/2022   TRIG 75 12/31/2022   HDL 49 12/31/2022   VLDL 16 02/28/2019   LDLCALC 54 12/31/2022   LDLCALC 67 07/09/2022   Lab Results  Component Value Date   TSH 0.940 07/09/2022   TSH 2.060 07/01/2021    Therapeutic Level Labs: No results found for: "  LITHIUM" No results found for: "VALPROATE" No results found for: "CBMZ"  Current Medications: Current Outpatient Medications  Medication Sig Dispense Refill   acetaminophen (TYLENOL) 500 MG tablet Take 2 tablets (1,000 mg total) by mouth every 6 (six) hours as needed for mild pain.     atorvastatin (LIPITOR) 10 MG tablet TAKE 1 TABLET(10 MG) BY MOUTH DAILY 90 tablet 4   Cholecalciferol 1.25 MG (50000 UT) TABS Take 1 tablet by mouth once a week. 12 tablet 4   meclizine (ANTIVERT) 12.5 MG tablet Take 1 tablet (12.5 mg total) by mouth 3 (three) times daily as needed for dizziness. 60 tablet 0   sertraline (ZOLOFT) 100 MG tablet Take 1 tablet (100 mg total) by mouth at bedtime. Start after completing 50 mg at night for one week 30 tablet 2   traZODone (DESYREL) 150 MG tablet Take 1 tablet (150 mg total) by mouth at bedtime as needed for sleep. 30 tablet 2   No current facility-administered medications for this visit.     Musculoskeletal: Strength & Muscle Tone: within normal limits Gait & Station:  normal Patient leans: N/A  Psychiatric Specialty Exam: Review of Systems  Psychiatric/Behavioral:  Positive for dysphoric mood and sleep disturbance. Negative for agitation, behavioral problems, confusion, decreased concentration, hallucinations, self-injury and suicidal ideas. The patient is nervous/anxious. The patient is not hyperactive.   All other systems reviewed and are negative.   Blood pressure 102/63, pulse 88, temperature 97.7 F (36.5 C), temperature source Skin, height 5' 4.02"  (1.626 m), weight 193 lb 6.4 oz (87.7 kg).Body mass index is 33.18 kg/m.  General Appearance: Fairly Groomed  Eye Contact:  Good  Speech:  Clear and Coherent  Volume:  Normal  Mood:   better  Affect:  Appropriate, Congruent, and calm, smiles  Thought Process:  Coherent  Orientation:  Full (Time, Place, and Person)  Thought Content: Logical   Suicidal Thoughts:  No  Homicidal Thoughts:  No  Memory:  Immediate;   Good  Judgement:  Good  Insight:  Good  Psychomotor Activity:  Normal  Concentration:  Concentration: Good and Attention Span: Good  Recall:  Good  Fund of Knowledge: Good  Language: Good  Akathisia:  No  Handed:  Right  AIMS (if indicated): not done  Assets:  Communication Skills Desire for Improvement  ADL's:  Intact  Cognition: WNL  Sleep:  Poor   Screenings: AUDIT    Flowsheet Row Clinical Support from 06/11/2022 in [facility]  Alcohol Use Disorder Identification Test Final Score (AUDIT) 8      GAD-7    Flowsheet Row Office Visit from 01/13/2023 in [facility] Office Visit from 12/31/2022 in [facility] Office Visit from 11/26/2022 in [facility] Office Visit from 07/09/2022 in [facility] Office Visit from 01/03/2022 in [facility]  Total GAD-7 Score 13 19 21 12 14       PHQ2-9    Flowsheet Row Office Visit from 01/13/2023 in [facility] Office Visit from 12/31/2022 in [facility] Office Visit from 12/16/2022 in [facility] Care Coordination from 12/10/2022 in [facility] Office Visit from 11/26/2022 in [facility]  PHQ-2 Total Score 4 6 6 6 6   PHQ-9 Total Score 15 22 23 17 22       Flowsheet Row Pre-Admission Testing  45 from  04/02/2022 in [facility] PRE ADMISSION TESTING  C-SSRS RISK CATEGORY No Risk        Assessment and Plan:  Heather Hill is a 67 y.o. year old female with a history of depression,s/p hysterectomy, , who is referred for depression.   1. MDD (major depressive disorder), recurrent episode, moderate (HCC) Acute stressors include:  Other stressors include: loss of her husband from MI in 2021, loss of her son (transferred from [place]) in 2000 at age 75, financial strain    History: transferred from [facility], was on fluoxetine 40 mg BID, Abilify 5 mg daily, lorazepam 1 mg twice a day- ran out a week prior to the visit She continues to struggle with daily activities, although there has been overall improvement in her mood symptoms since starting sertraline.  Will continue current dose to see if it exerts its full benefit for depression.  She will greatly benefit from CBT , and a referral was made .  She agreed to contact their office to schedule the appointment.   2. Insomnia, unspecified type - +snoring - drinks coffee two cups per day every other day She has initial , middle insomnia , and reports limited benefit from uptitration of trazodone.  She agrees to limit caffeine intake.  She agrees for evaluation of sleep apnea.     # history of alcohol use She reports a history of alcohol use when her husband was alive, who also struggled with alcohol-related issues. We will continue to assess this.    Plan Continue sertraline 100 mg at night  Referral for evaluation of insomnia Continue trazodone 150 mg at night as needed for insomnia Referred to therapy  Next appointment: 7/9 at 2 pm for 30 mins, IP - unable to complete all screenings due to time constraint. Will plan to assess at the next meeting   Past trials of medication: fluoxetine, Abilify, lorazepam     The patient demonstrates the following risk factors for suicide: Chronic risk factors for suicide include:  psychiatric disorder of depression . Acute risk factors for suicide include: loss (financial, interpersonal, professional). Protective factors for this patient include: positive social support, responsibility to others (children, family), coping skills, and hope for the future. Considering these factors, the overall suicide risk at this point appears to be low. Patient is appropriate for outpatient follow up.   Collaboration of Care: Collaboration of Care: Other reviewed notes in Epic  Patient/Guardian was advised Release of Information must be obtained prior to any record release in order to collaborate their care with an outside provider. Patient/Guardian was advised if they have not already done so to contact the registration department to sign all necessary forms in order for [place] to release information regarding their care.   Consent: Patient/Guardian gives verbal consent for treatment and assignment of benefits for services provided during this visit. Patient/Guardian expressed understanding and agreed to proceed.    [name], MD 01/13/2023, 11:16 AM

## 2023-01-13 ENCOUNTER — Encounter: Payer: Self-pay | Admitting: Nurse Practitioner

## 2023-01-13 ENCOUNTER — Ambulatory Visit (INDEPENDENT_AMBULATORY_CARE_PROVIDER_SITE_OTHER): Payer: Medicare HMO | Admitting: Psychiatry

## 2023-01-13 ENCOUNTER — Encounter: Payer: Self-pay | Admitting: Psychiatry

## 2023-01-13 VITALS — BP 102/63 | HR 88 | Temp 97.7°F | Ht 64.02 in | Wt 193.4 lb

## 2023-01-13 DIAGNOSIS — F331 Major depressive disorder, recurrent, moderate: Secondary | ICD-10-CM | POA: Diagnosis not present

## 2023-01-13 DIAGNOSIS — G4700 Insomnia, unspecified: Secondary | ICD-10-CM

## 2023-01-13 NOTE — Patient Instructions (Signed)
Continue sertraline 100 mg at night  Referral for evaluation of insomnia Continue trazodone 150 mg at night as needed for insomnia Referred to therapy  Next appointment: 7/9 at 2 pm

## 2023-01-14 ENCOUNTER — Ambulatory Visit: Payer: Self-pay | Admitting: *Deleted

## 2023-01-14 NOTE — Telephone Encounter (Signed)
  Chief Complaint: Mild facial swelling, right sided Symptoms: Reports mild "Puffiness" right side at jaw bone. Itching at times. States "I think it's from the Vit. D  I take."  Frequency: 2 weeks Pertinent Negatives: Patient denies redness, warmth, pain, SOB, tongue swelling Disposition: [] ED /[] Urgent Care (no appt availability in office) / [x] Appointment(In office/virtual)/ []  [place] [facility]/ [] Home Care/ [] Refused Recommended Disposition /[] [facility]/ []  Follow-up with PCP Additional Notes: Pt states daughter noted on left side "But I think it's my right side." States mild, just noticeable. Pt will see Jolene only. Secured next available, appt for February 02, 2023. Advised may need to be seen sooner, assured NT would route to practicefror PCPs review. Pt states she took Vit D yesterday, takes weekly on Tuesdays. Care advise provided, pt verbalizes understanding. Reason for Disposition  [1] Mild face swelling (puffiness) AND [2] persists > 3 days  Answer Assessment - Initial Assessment Questions 1. ONSET: "When did the swelling start?" (e.g., minutes, hours, days)     2 weeks 2. LOCATION: "What part of the face is swollen?"     Right side at jaw bone 3. SEVERITY: "How swollen is it?"     Little puffy 4. ITCHING: "Is there any itching?" If Yes, ask: "How much?"   (Scale 1-10; mild, moderate or severe)     Itchy 5. PAIN: "Is the swelling painful to touch?" If Yes, ask: "How painful is it?"   (Scale 1-10; mild, moderate or severe)   - NONE (0): no pain   - MILD (1-3): doesn't interfere with normal activities    - MODERATE (4-7): interferes with normal activities or awakens from sleep    - SEVERE (8-10): excruciating pain, unable to do any normal activities      No 6. FEVER: "Do you have a fever?" If Yes, ask: "What is it, how was it measured, and when did it start?"      No 7. CAUSE: "What do you think is causing the face swelling?"     Medication 8. RECURRENT  SYMPTOM: "Have you had face swelling before?" If Yes, ask: "When was the last time?" "What happened that time?"     No 9. OTHER SYMPTOMS: "Do you have any other symptoms?" (e.g., toothache, leg swelling)   No  Protocols used: Face Swelling-A-AH

## 2023-01-15 NOTE — Telephone Encounter (Signed)
Patient made aware of Provider's recommendations and verbalized understanding.   

## 2023-01-20 ENCOUNTER — Ambulatory Visit (INDEPENDENT_AMBULATORY_CARE_PROVIDER_SITE_OTHER): Payer: Medicare HMO

## 2023-01-20 ENCOUNTER — Telehealth: Payer: Medicare HMO

## 2023-01-20 DIAGNOSIS — F331 Major depressive disorder, recurrent, moderate: Secondary | ICD-10-CM

## 2023-01-20 DIAGNOSIS — J432 Centrilobular emphysema: Secondary | ICD-10-CM

## 2023-01-20 NOTE — Patient Instructions (Addendum)
Please call the care guide team at [phone] if you need to cancel or reschedule your appointment.   If you are experiencing a Mental Health or Behavioral Health Crisis or need someone to talk to, please call the Suicide and Crisis Lifeline: 988 call the [place] National Suicide Prevention Lifeline: 3642113431 or TTY: 856-454-6952 TTY [phone]) to talk to a trained counselor call 1-800-273-TALK (toll free, 24 hour hotline)   Following is a copy of the CCM Program Consent:  CCM service includes personalized support from designated clinical staff supervised by the physician, including individualized plan of care and coordination with other care providers 24/7 contact phone numbers for assistance for urgent and routine care needs. Service will only be billed when office clinical staff spend 20 minutes or more in a month to coordinate care. Only one practitioner may furnish and bill the service in a calendar month. The patient may stop CCM services at amy time (effective at the end of the month) by phone call to the office staff. The patient will be responsible for cost sharing (co-pay) or up to 20% of the service fee (after annual deductible is met)  Following is a copy of your full provider care plan:   Goals Addressed             This Visit's Progress    CCM Expected Outcome:  Monitor, Self-Manage and Reduce Symptoms [id number]       Current Barriers:  Knowledge Deficits related to resources in the community to help with expressed needs to effectively manage her health and wellbeing Care Coordination needs related to medication cost and mental health resources and support  in a patient with depression Chronic Disease Management support and education needs related to effective management of depression Loss of her husband in 2022, loss of her only son who was in the service in 2003 The patients mother diet on 01-16-2023  Planned Interventions: Evaluation of current treatment  plan related to depression  and patient's adherence to plan as established by provider. The patient has seen her providers this month and there  has been a change in her medications. She is feeling better. Her mother died 24-May-2024and this is added grief. She feels overall she is doing well. Discussed ways to deal effectively with the grief process. She states that she is cleaning her house and got one of the rooms completely cleaned and this was very good for her.  Advised patient to call the office for changes in mood, anxiety, depression, or changes in her mental health and well being Provided education to patient re: [email or web address] for support of effective management of the grief process with the loss of her husband in 2022,  her son in 2003, and her mother on 01/16/23. Review of the [place] program and the benefits of connecting with others who have lost loved ones in death. She is going to look into this. [email or web address] has online and in person meetings. Review of how to go to the site and find groups in her area.  Reviewed medications with patient and discussed compliance. The patient states compliance with medications. Is receptive to working with the pharm D for help with medications cost constraints  Provided patient with mindfulness and stress reduction educational materials related to dealing effectively with anxiety and stress.  Reviewed scheduled/upcoming provider appointments including 02-02-2023 at 9 am Care Guide referral for resources in the community for financial constraints, utilities, and food resources. This has been completed Social Work referral for help with  questions about social support and if she would qualify for Medicaid. The patient works with the LCSW on a regular basis Pharmacy referral for help with cost constraints related to medication cost Discussed plans with patient for ongoing care management follow up and provided patient with direct contact information for  care management team Advised patient to discuss changes in mood, anxiety, depression or other mentla health concerns with provider Screening for signs and symptoms of depression related to chronic disease state  Assessed social determinant of health barriers The patient is having some facial swelling and itching in her head. The patient thought it was medications she may be taking. Review of medications and did not see any immediate concerns. The patient did change her shampoo that she was using and this may be the reason her head is itching. She states that she washes her hair a lot. Education on going back to the shampoo she was using to see if this helps with the itching. Education on writing down factors that may contribute to sx and sx she is having. The patient is keeping a log. Will continue to monitor. She will follow up with the pcp next week.   Symptom Management: Take medications as prescribed   Attend all scheduled provider appointments Call provider office for new concerns or questions  call the Suicide and Crisis Lifeline: 988 call the [place] National Suicide Prevention Lifeline: (858) 790-5262 or TTY: 442-611-9072 TTY 778-081-8687) to talk to a trained counselor call 1-800-273-TALK (toll free, 24 hour hotline) if experiencing a Mental Health or Behavioral Health Crisis   Follow Up Plan: Telephone follow up appointment with care management team member scheduled for: 03-02-2023 at 0900 am       CCM:  Maintain, Monitor and Self-Manage Symptoms of COPD       Current Barriers:  Knowledge Deficits related to the importance of smoking cessation in a patient with COPD, support and education for resources to help with management of COPD and other chronic conditions Care Coordination needs related to medication cost and community resources needed in a patient with COPD Chronic Disease Management support and education needs related to Effective management of COPD Current everyday smoker. She  discussed wanting to stop smoking today (01-20-2023)  Planned Interventions: Provided patient with basic written and verbal COPD education on self care/management/and exacerbation prevention. The patient denies any changes in her breathing. Is having some facial swelling but denies it impacting her breathing will see the pcp next week for this. Is still smoking and wants to talk to the pcp about quitting. Education and support given.  Advised patient to track and manage COPD triggers Provided instruction about proper use of medications used for management of COPD including inhalers Advised patient to self assesses COPD action plan zone and make appointment with provider if in the yellow zone for 48 hours without improvement Advised patient to engage in light exercise as tolerated 3-5 days a week to aid in the the management of COPD Provided education about and advised patient to utilize infection prevention strategies to reduce risk of respiratory infection. Review and education given.  Discussed the importance of adequate rest and management of fatigue with COPD Referral made to community resources care guide team for assistance with resources in the community for food insecurities and financial constraints  Screening for signs and symptoms of depression related to chronic disease state  Assessed social determinant of health barriers Discussed smoking cessation and discussing options with the pcp about smoking cessation. The patient states  that she has been smoking since she was in the 10th grade of high school. Is ready to talk about smoking cessation.   Symptom Management: Take medications as prescribed   Attend all scheduled provider appointments Call provider office for new concerns or questions  call the Suicide and Crisis Lifeline: 988 call the [place] National Suicide Prevention Lifeline: 513 736 2916 or TTY: (223)742-3386 TTY 808-725-0489) to talk to a trained counselor call  1-800-273-TALK (toll free, 24 hour hotline) if experiencing a Mental Health or Behavioral Health Crisis  eliminate smoking in my home identify and remove indoor air pollutants limit outdoor activity during cold weather listen for public air quality announcements every day do breathing exercises every day develop a rescue plan eliminate symptom triggers at home follow rescue plan if symptoms flare-up  Follow Up Plan: Telephone follow up appointment with care management team member scheduled for: 03-02-2023 at 0900 am          Patient verbalizes understanding of instructions and care plan provided today and agrees to view in MyChart. Active MyChart status and patient understanding of how to access instructions and care plan via MyChart confirmed with patient.  Telephone follow up appointment with care management team member scheduled for: 03-02-2023 at 0900 am  Managing Loss, Adult People experience loss in many different ways throughout their lives. Events such as moving, changing jobs, and losing friends can create a sense of loss. The loss may be as serious as a major health change, divorce, death of a pet, or death of a loved one. All of these types of loss are likely to create a physical and emotional reaction known as grief. Grief is the result of a major change or an absence of something or someone that you count on. Grief is a normal reaction to loss. A variety of factors can affect your grieving experience, including: The nature of your loss. Your relationship to what or whom you lost. Your understanding of grief and how to manage it. Your support system. Be aware that when grief becomes extreme, it can lead to more severe issues like isolation, depression, anxiety, or suicidal thoughts. Talk with your health care provider if you have any of these issues. How to manage lifestyle changes Keep to your normal routine as much as possible. If you have trouble focusing or doing normal  activities, it is acceptable to take some time away from your normal routine. Spend time with friends and loved ones. Eat a healthy diet, get plenty of sleep, and rest when you feel tired. How to recognize changes  The way that you deal with your grief will affect your ability to function as you normally do. When grieving, you may experience these changes: Numbness, shock, sadness, anxiety, anger, denial, and guilt. Thoughts about death. Unexpected crying. A physical sensation of emptiness in your stomach. Problems sleeping and eating. Tiredness (fatigue). Loss of interest in normal activities. Dreaming about or imagining seeing the person who died. A need to remember what or whom you lost. Difficulty thinking about anything other than your loss for a period of time. Relief. If you have been expecting the loss for a while, you may feel a sense of relief when it happens. Follow these instructions at home: Activity Express your feelings in healthy ways, such as: Talking with others about your loss. It may be helpful to find others who have had a similar loss, such as a support group. Writing down your feelings in a journal. Doing physical activities to release  stress and emotional energy. Doing creative activities like painting, sculpting, or playing or listening to music. Practicing resilience. This is the ability to recover and adjust after facing challenges. Reading some resources that encourage resilience may help you to learn ways to practice those behaviors.  General instructions Be patient with yourself and others. Allow the grieving process to happen, and remember that grieving takes time. It is likely that you may never feel completely done with some grief. You may find a way to move on while still cherishing memories and feelings about your loss. Accepting your loss is a process. It can take months or longer to adjust. Keep all follow-up visits. This is important. Where to find  support To get support for managing loss: Ask your health care provider for help and recommendations, such as grief counseling or therapy. Think about joining a support group for people who are managing a loss. Where to find more information You can find more information about managing loss from: American Society of Clinical Oncology: www.cancer.net American Psychological Association: [employer] Contact a health care provider if: Your grief is extreme and keeps getting worse. You have ongoing grief that does not improve. Your body shows symptoms of grief, such as illness. You feel depressed, anxious, or hopeless. Get help right away if: You have thoughts about hurting yourself or others. Get help right away if you feel like you may hurt yourself or others, or have thoughts about taking your own life. Go to your nearest emergency room or: Call 911. Call the National Suicide Prevention Lifeline at (425) 578-8189 or 988. This is open 24 hours a day. Text the Crisis Text Line at 703-189-9533. Summary Grief is the result of a major change or an absence of someone or something that you count on. Grief is a normal reaction to loss. The depth of grief and the period of recovery depend on the type of loss and your ability to adjust to the change and process your feelings. Processing grief requires patience and a willingness to accept your feelings and talk about your loss with people who are supportive. It is important to find resources that work for you and to realize that people experience grief differently. There is not one grieving process that works for everyone in the same way. Be aware that when grief becomes extreme, it can lead to more severe issues like isolation, depression, anxiety, or suicidal thoughts. Talk with your health care provider if you have any of these issues. This information is not intended to replace advice given to you by your health care provider. Make sure you discuss any  questions you have with your health care provider. Document Revised: 04/01/2021 Document Reviewed: 04/01/2021 Elsevier Patient Education  2024 Else[employer] Inc. Mindfulness-Based Stress Reduction Mindfulness-based stress reduction (MBSR) is a program that helps people learn to practice mindfulness. Mindfulness is the practice of consciously paying attention to the present moment. MBSR focuses on developing self-awareness, which lets you respond to life stress without judgment or negative feelings. It can be learned and practiced through techniques such as education, breathing exercises, meditation, and yoga. MBSR includes several mindfulness techniques in one program. MBSR works best when you understand the treatment, are willing to try new things, and can commit to spending time practicing what you learn. MBSR training may include learning about: How your feelings, thoughts, and reactions affect your body. New ways to respond to things that cause negative thoughts to start (triggers). How to notice your thoughts and let go of  them. Practicing awareness of everyday things that you normally do without thinking. The techniques and goals of different types of meditation. What are the benefits of MBSR? MBSR can have many benefits, which include helping you to: Develop self-awareness. This means knowing and understanding yourself. Learn skills and attitudes that help you to take part in your own health care. Learn new ways to care for yourself. Be more accepting about how things are, and let things go. Be less judgmental and approach things with an open mind. Be patient with yourself and trust yourself more. MBSR has also been shown to: Reduce negative emotions, such as sadness, overwhelm, and worry. Improve memory and focus. Change how you sense and react to pain. Boost your body's ability to fight infections. Help you connect better with other people. Improve your sense of well-being. How to  practice mindfulness To do a basic awareness exercise: Find a comfortable place to sit. Pay attention to the present moment. Notice your thoughts, feelings, and surroundings just as they are. Avoid judging yourself, your feelings, or your surroundings. Make note of any judgment that comes up and let it go. Your mind may wander, and that is okay. Make note of when your thoughts drift, and return your attention to the present moment. To do basic mindfulness meditation: Find a comfortable place to sit. This may include a stable chair or a firm floor cushion. Sit upright with your back straight. Let your arms fall next to your sides, with your hands resting on your legs. If you are sitting in a chair, rest your feet flat on the floor. If you are sitting on a cushion, cross your legs in front of you. Keep your head in a neutral position with your chin dropped slightly. Relax your jaw and rest the tip of your tongue on the roof of your mouth. Drop your gaze to the floor or close your eyes. Breathe normally and pay attention to your breath. Feel the air moving in and out of your nose. Feel your belly expanding and relaxing with each breath. Your mind may wander, and that is okay. Make note of when your thoughts drift, and return your attention to your breath. Avoid judging yourself, your feelings, or your surroundings. Make note of any judgment or feelings that come up, let them go, and bring your attention back to your breath. When you are ready, lift your gaze or open your eyes. Pay attention to how your body feels after the meditation. Follow these instructions at home:  Find a local in-person or online MBSR program. Set aside some time regularly for mindfulness practice. Practice every day if you can. Even 10 minutes of practice is helpful. Find a mindfulness practice that works best for you. This may include one or more of the following: Meditation. This involves focusing your mind on a certain  thought or activity. Breathing awareness exercises. These help you to stay present by focusing on your breath. Body scan. For this practice, you lie down and pay attention to each part of your body from head to toe. You can identify tension and soreness and consciously relax parts of your body. Yoga. Yoga involves stretching and breathing, and it can improve your ability to move and be flexible. It can also help you to test your body's limits, which can help you release stress. Mindful eating. This way of eating involves focusing on the taste, texture, color, and smell of each bite of food. This slows down eating and helps you  feel full sooner. For this reason, it can be an important part of a weight loss plan. Find a podcast or recording that provides guidance for breathing awareness, body scan, or meditation exercises. You can listen to these any time when you have a free moment to rest without distractions. Follow your treatment plan as told by your health care provider. This may include taking regular medicines and making changes to your diet or lifestyle as recommended. Where to find more information You can find more information about MBSR from: Your health care provider. Community-based meditation centers or programs. Programs offered near you. Summary Mindfulness-based stress reduction (MBSR) is a program that teaches you how to consciously pay attention to the present moment. It is used to help you deal better with daily stress, feelings, and pain. MBSR focuses on developing self-awareness, which allows you to respond to life stress without judgment or negative feelings. MBSR programs may involve learning different mindfulness practices, such as breathing exercises, meditation, yoga, body scan, or mindful eating. Find a mindfulness practice that works best for you, and set aside time for it on a regular basis. This information is not intended to replace advice given to you by your health  care provider. Make sure you discuss any questions you have with your health care provider. Document Revised: 03/21/2021 Document Reviewed: 03/21/2021 Elsevier Patient Education  2024 Else[employer] Inc. Managing the Challenge of Quitting Smoking Quitting smoking is a physical and mental challenge. You may have cravings, withdrawal symptoms, and temptation to smoke. Before quitting, work with your health care provider to make a plan that can help you manage quitting. Making a plan before you quit may keep you from smoking when you have the urge to smoke while trying to quit. How to manage lifestyle changes Managing stress Stress can make you want to smoke, and wanting to smoke may cause stress. It is important to find ways to manage your stress. You could try some of the following: Practice relaxation techniques. Breathe slowly and deeply, in through your nose and out through your mouth. Listen to music. Soak in a bath or take a shower. Imagine a peaceful place or vacation. Get some support. Talk with family or friends about your stress. Join a support group. Talk with a counselor or therapist. Get some physical activity. Go for a walk, run, or bike ride. Play a favorite sport. Practice yoga.  Medicines Talk with your health care provider about medicines that might help you deal with cravings and make quitting easier for you. Relationships Social situations can be difficult when you are quitting smoking. To manage this, you can: Avoid parties and other social situations where people might be smoking. Avoid alcohol. Leave right away if you have the urge to smoke. Explain to your family and friends that you are quitting smoking. Ask for support and let them know you might be a bit grumpy. Plan activities where smoking is not an option. General instructions Be aware that many people gain weight after they quit smoking. However, not everyone does. To keep from gaining weight, have a plan in  place before you quit, and stick to the plan after you quit. Your plan should include: Eating healthy snacks. When you have a craving, it may help to: Eat popcorn, or try carrots, celery, or other cut vegetables. Chew sugar-free gum. Changing how you eat. Eat small portion sizes at meals. Eat 4-6 small meals throughout the day instead of 1-2 large meals a day. Be mindful when you  eat. You should avoid watching television or doing other things that might distract you as you eat. Exercising regularly. Make time to exercise each day. If you do not have time for a long workout, do short bouts of exercise for 5-10 minutes several times a day. Do some form of strengthening exercise, such as weight lifting. Do some exercise that gets your heart beating and causes you to breathe deeply, such as walking fast, running, swimming, or biking. This is very important. Drinking plenty of water or other low-calorie or no-calorie drinks. Drink enough fluid to keep your urine pale yellow.  How to recognize withdrawal symptoms Your body and mind may experience discomfort as you try to get used to not having nicotine in your system. These effects are called withdrawal symptoms. They may include: Feeling hungrier than normal. Having trouble concentrating. Feeling irritable or restless. Having trouble sleeping. Feeling depressed. Craving a cigarette. These symptoms may surprise you, but they are normal to have when quitting smoking. To manage withdrawal symptoms: Avoid places, people, and activities that trigger your cravings. Remember why you want to quit. Get plenty of sleep. Avoid coffee and other drinks that contain caffeine. These may worsen some of your symptoms. How to manage cravings Come up with a plan for how to deal with your cravings. The plan should include the following: A definition of the specific situation you want to deal with. An activity or action you will take to replace smoking. A  clear idea for how this action will help. The name of someone who could help you with this. Cravings usually last for 5-10 minutes. Consider taking the following actions to help you with your plan to deal with cravings: Keep your mouth busy. Chew sugar-free gum. Suck on hard candies or a straw. Brush your teeth. Keep your hands and body busy. Change to a different activity right away. Squeeze or play with a ball. Do an activity or a hobby, such as making bead jewelry, practicing needlepoint, or working with wood. Mix up your normal routine. Take a short exercise break. Go for a quick walk, or run up and down stairs. Focus on doing something kind or helpful for someone else. Call a friend or family member to talk during a craving. Join a support group. Contact a quitline. Where to find support To get help or find a support group: Call the National [facility]uit[facility]: 1-800-QUIT-NOW 606-553-5763) Text QUIT to SmokefreeTXT: 454098 Where to find more information Visit these websites to find more information on quitting smoking: U.S. Department of Health and Human Services: www.smokefree.gov American Lung Association: www.freedomfromsmoking.org Centers for Disease Control and Prevention (CDC): FootballExhibition.com.br American Heart Association: www.heart.org Contact a health care provider if: You want to change your plan for quitting. The medicines you are taking are not helping. Your eating feels out of control or you cannot sleep. You feel depressed or become very anxious. Summary Quitting smoking is a physical and mental challenge. You will face cravings, withdrawal symptoms, and temptation to smoke again. Preparation can help you as you go through these challenges. Try different techniques to manage stress, handle social situations, and prevent weight gain. You can deal with cravings by keeping your mouth busy (such as by chewing gum), keeping your hands and body busy, calling  family or friends, or contacting a quitline for people who want to quit smoking. You can deal with withdrawal symptoms by avoiding places where people smoke, getting plenty of rest, and avoiding drinks that contain caffeine.  This information is not intended to replace advice given to you by your health care provider. Make sure you discuss any questions you have with your health care provider. Document Revised: 08/02/2021 Document Reviewed: 08/02/2021 Elsevier Patient Education  2024 [employer].

## 2023-01-20 NOTE — Chronic Care Management (AMB) (Signed)
Chronic Care Management   CCM RN Visit Note  01/20/2023 Name: Heather Hill MRN: [id number] DOB: 06-09-1956  Subjective: [name] is a 67 y.o. year old female who is a primary care patient of Cannady, Dorie Rank, NP. The patient was referred to the Chronic Care Management team for assistance with care management needs subsequent to provider initiation of CCM services and plan of care.    Today's Visit:  Engaged with patient by telephone for follow up visit.        Goals Addressed             This Visit's Progress    CCM Expected Outcome:  Monitor, Self-Manage and Reduce Symptoms [id number]       Current Barriers:  Knowledge Deficits related to resources in the community to help with expressed needs to effectively manage her health and wellbeing Care Coordination needs related to medication cost and mental health resources and support  in a patient with depression Chronic Disease Management support and education needs related to effective management of depression Loss of her husband in 2022, loss of her only son who was in the service in 2003 The patients mother diet on 01-15-23  Planned Interventions: Evaluation of current treatment plan related to depression  and patient's adherence to plan as established by provider. The patient has seen her providers this month and there  has been a change in her medications. She is feeling better. Her mother died 05/23/24and this is added grief. She feels overall she is doing well. Discussed ways to deal effectively with the grief process. She states that she is cleaning her house and got one of the rooms completely cleaned and this was very good for her.  Advised patient to call the office for changes in mood, anxiety, depression, or changes in her mental health and well being Provided education to patient re: GriefShare.org for support of effective management of the grief process with the loss of her husband in 2022,  her son in  2003, and her mother on Jan 15, 2023. Review of the [place] program and the benefits of connecting with others who have lost loved ones in death. She is going to look into this. GriefShare.org has online and in person meetings. Review of how to go to the site and find groups in her area.  Reviewed medications with patient and discussed compliance. The patient states compliance with medications. Is receptive to working with the pharm D for help with medications cost constraints  Provided patient with mindfulness and stress reduction educational materials related to dealing effectively with anxiety and stress.  Reviewed scheduled/upcoming provider appointments including 02-02-2023 at 9 am Care Guide referral for resources in the community for financial constraints, utilities, and food resources. This has been completed Social Work referral for help with questions about social support and if she would qualify for Medicaid. The patient works with the LCSW on a regular basis Pharmacy referral for help with cost constraints related to medication cost Discussed plans with patient for ongoing care management follow up and provided patient with direct contact information for care management team Advised patient to discuss changes in mood, anxiety, depression or other mentla health concerns with provider Screening for signs and symptoms of depression related to chronic disease state  Assessed social determinant of health barriers The patient is having some facial swelling and itching in her head. The patient thought it was medications she may be taking. Review of medications and did not see any  immediate concerns. The patient did change her shampoo that she was using and this may be the reason her head is itching. She states that she washes her hair a lot. Education on going back to the shampoo she was using to see if this helps with the itching. Education on writing down factors that may contribute to sx and sx she  is having. The patient is keeping a log. Will continue to monitor. She will follow up with the pcp next week.   Symptom Management: Take medications as prescribed   Attend all scheduled provider appointments Call provider office for new concerns or questions  call the Suicide and Crisis Lifeline: 988 call the [place] National Suicide Prevention Lifeline: (610) 049-8348 or TTY: 7702298733 TTY 717-294-2384) to talk to a trained counselor call 1-800-273-TALK (toll free, 24 hour hotline) if experiencing a Mental Health or Behavioral Health Crisis   Follow Up Plan: Telephone follow up appointment with care management team member scheduled for: 03-02-2023 at 0900 am       CCM:  Maintain, Monitor and Self-Manage Symptoms of COPD       Current Barriers:  Knowledge Deficits related to the importance of smoking cessation in a patient with COPD, support and education for resources to help with management of COPD and other chronic conditions Care Coordination needs related to medication cost and community resources needed in a patient with COPD Chronic Disease Management support and education needs related to Effective management of COPD Current everyday smoker. She discussed wanting to stop smoking today (01-20-2023)  Planned Interventions: Provided patient with basic written and verbal COPD education on self care/management/and exacerbation prevention. The patient denies any changes in her breathing. Is having some facial swelling but denies it impacting her breathing will see the pcp next week for this. Is still smoking and wants to talk to the pcp about quitting. Education and support given.  Advised patient to track and manage COPD triggers Provided instruction about proper use of medications used for management of COPD including inhalers Advised patient to self assesses COPD action plan zone and make appointment with provider if in the yellow zone for 48 hours without improvement Advised patient to  engage in light exercise as tolerated 3-5 days a week to aid in the the management of COPD Provided education about and advised patient to utilize infection prevention strategies to reduce risk of respiratory infection. Review and education given.  Discussed the importance of adequate rest and management of fatigue with COPD Referral made to community resources care guide team for assistance with resources in the community for food insecurities and financial constraints  Screening for signs and symptoms of depression related to chronic disease state  Assessed social determinant of health barriers Discussed smoking cessation and discussing options with the pcp about smoking cessation. The patient states that she has been smoking since she was in the 10th grade of high school. Is ready to talk about smoking cessation.   Symptom Management: Take medications as prescribed   Attend all scheduled provider appointments Call provider office for new concerns or questions  call the Suicide and Crisis Lifeline: 988 call the [place] National Suicide Prevention Lifeline: 6718361857 or TTY: 615-715-6824 TTY (210)544-3432) to talk to a trained counselor call 1-800-273-TALK (toll free, 24 hour hotline) if experiencing a Mental Health or Behavioral Health Crisis  eliminate smoking in my home identify and remove indoor air pollutants limit outdoor activity during cold weather listen for public air quality announcements every day do breathing exercises every  day develop a rescue plan eliminate symptom triggers at home follow rescue plan if symptoms flare-up  Follow Up Plan: Telephone follow up appointment with care management team member scheduled for: 03-02-2023 at 0900 am          Plan:Telephone follow up appointment with care management team member scheduled for:  03-02-2023 at 0900 am  Alto Denver RN, MSN, CCM RN Care Manager  Chronic Care Management Direct Number: [phone]

## 2023-01-22 NOTE — Telephone Encounter (Signed)
Called patient she stated that she is better still has some swelling in her face she was told by Jolene to stop the Vitamin D she did stop. She did not go to the ER or urgent care she is aware of her next appt and did keep the appt.

## 2023-01-23 DIAGNOSIS — F17210 Nicotine dependence, cigarettes, uncomplicated: Secondary | ICD-10-CM | POA: Diagnosis not present

## 2023-01-23 DIAGNOSIS — J449 Chronic obstructive pulmonary disease, unspecified: Secondary | ICD-10-CM | POA: Diagnosis not present

## 2023-01-23 DIAGNOSIS — F32A Depression, unspecified: Secondary | ICD-10-CM | POA: Diagnosis not present

## 2023-01-26 ENCOUNTER — Ambulatory Visit
Admission: RE | Admit: 2023-01-26 | Discharge: 2023-01-26 | Disposition: A | Discharge status: Home / Self Care | Payer: Medicare HMO | Source: Ambulatory Visit | Attending: Acute Care | Admitting: Acute Care

## 2023-01-26 DIAGNOSIS — Z122 Encounter for screening for malignant neoplasm of respiratory organs: Secondary | ICD-10-CM | POA: Diagnosis not present

## 2023-01-26 DIAGNOSIS — F17210 Nicotine dependence, cigarettes, uncomplicated: Secondary | ICD-10-CM

## 2023-01-26 DIAGNOSIS — Z87891 Personal history of nicotine dependence: Secondary | ICD-10-CM

## 2023-01-28 ENCOUNTER — Ambulatory Visit (INDEPENDENT_AMBULATORY_CARE_PROVIDER_SITE_OTHER): Payer: Medicare HMO | Admitting: Primary Care

## 2023-01-28 ENCOUNTER — Encounter: Payer: Self-pay | Admitting: Primary Care

## 2023-01-28 VITALS — BP 110/62 | HR 78 | Temp 98.0°F | Ht 64.02 in | Wt 193.8 lb

## 2023-01-28 DIAGNOSIS — R0683 Snoring: Secondary | ICD-10-CM | POA: Diagnosis not present

## 2023-01-28 NOTE — Progress Notes (Signed)
Reviewed and agree with assessment/plan.   Coralyn Helling, MD [facility] Pulmonary/Critical Care 01/28/2023, 9:31 AM Pager:  [phone]

## 2023-01-28 NOTE — Patient Instructions (Addendum)
Recommendations: Take trazodone 30-60 mins before bedtime and make sure you put your self to sleep soon after taking Focus on side sleeping position Do not drive if experiencing excessive daytime sleepiness fatigue Work on weight loss as able  Orders: Home sleep study re: snoring   Follow-up: Call to schedule follow-up 1-2 weeks after completing sleep study to review results and treatment options if needed  Sleep Apnea Sleep apnea is a condition in which breathing pauses or becomes shallow during sleep. People with sleep apnea usually snore loudly. They may have times when they gasp and stop breathing for 10 seconds or more during sleep. This may happen many times during the night. Sleep apnea disrupts your sleep and keeps your body from getting the rest that it needs. This condition can increase your risk of certain health problems, including: Heart attack. Stroke. Obesity. Type 2 diabetes. Heart failure. Irregular heartbeat. High blood pressure. The goal of treatment is to help you breathe normally again. What are the causes?  The most common cause of sleep apnea is a collapsed or blocked airway. There are three kinds of sleep apnea: Obstructive sleep apnea. This kind is caused by a blocked or collapsed airway. Central sleep apnea. This kind happens when the part of the brain that controls breathing does not send the correct signals to the muscles that control breathing. Mixed sleep apnea. This is a combination of obstructive and central sleep apnea. What increases the risk? You are more likely to develop this condition if you: Are overweight. Smoke. Have a smaller than normal airway. Are older. Are female. Drink alcohol. Take sedatives or tranquilizers. Have a family history of sleep apnea. Have a tongue or tonsils that are larger than normal. What are the signs or symptoms? Symptoms of this condition include: Trouble staying asleep. Loud snoring. Morning  headaches. Waking up gasping. Dry mouth or sore throat in the morning. Daytime sleepiness and tiredness. If you have daytime fatigue because of sleep apnea, you may be more likely to have: Trouble concentrating. Forgetfulness. Irritability or mood swings. Personality changes. Feelings of depression. Sexual dysfunction. This may include loss of interest if you are female, or erectile dysfunction if you are female. How is this diagnosed? This condition may be diagnosed with: A medical history. A physical exam. A series of tests that are done while you are sleeping (sleep study). These tests are usually done in a sleep lab, but they may also be done at home. How is this treated? Treatment for this condition aims to restore normal breathing and to ease symptoms during sleep. It may involve managing health issues that can affect breathing, such as high blood pressure or obesity. Treatment may include: Sleeping on your side. Using a decongestant if you have nasal congestion. Avoiding the use of depressants, including alcohol, sedatives, and narcotics. Losing weight if you are overweight. Making changes to your diet. Quitting smoking. Using a device to open your airway while you sleep, such as: An oral appliance. This is a custom-made mouthpiece that shifts your lower jaw forward. A continuous positive airway pressure (CPAP) device. This device blows air through a mask when you breathe out (exhale). A nasal expiratory positive airway pressure (EPAP) device. This device has valves that you put into each nostril. A bi-level positive airway pressure (BIPAP) device. This device blows air through a mask when you breathe in (inhale) and breathe out (exhale). Having surgery if other treatments do not work. During surgery, excess tissue is removed to create a  wider airway. Follow these instructions at home: Lifestyle Make any lifestyle changes that your health care provider recommends. Eat a healthy,  well-balanced diet. Take steps to lose weight if you are overweight. Avoid using depressants, including alcohol, sedatives, and narcotics. Do not use any products that contain nicotine or tobacco. These products include cigarettes, chewing tobacco, and vaping devices, such as e-cigarettes. If you need help quitting, ask your health care provider. General instructions Take over-the-counter and prescription medicines only as told by your health care provider. If you were given a device to open your airway while you sleep, use it only as told by your health care provider. If you are having surgery, make sure to tell your health care provider you have sleep apnea. You may need to bring your device with you. Keep all follow-up visits. This is important. Contact a health care provider if: The device that you received to open your airway during sleep is uncomfortable or does not seem to be working. Your symptoms do not improve. Your symptoms get worse. Get help right away if: You develop: Chest pain. Shortness of breath. Discomfort in your back, arms, or stomach. You have: Trouble speaking. Weakness on one side of your body. Drooping in your face. These symptoms may represent a serious problem that is an emergency. Do not wait to see if the symptoms will go away. Get medical help right away. Call your local emergency services (911 in the [place] Do not drive yourself to the hospital. Summary Sleep apnea is a condition in which breathing pauses or becomes shallow during sleep. The most common cause is a collapsed or blocked airway. The goal of treatment is to restore normal breathing and to ease symptoms during sleep. This information is not intended to replace advice given to you by your health care provider. Make sure you discuss any questions you have with your health care provider. Document Revised: 03/20/2021 Document Reviewed: 07/20/2020 Elsevier Patient Education  2024 [employer].

## 2023-01-28 NOTE — Assessment & Plan Note (Signed)
-   Patient has symptoms of loud snoring, restless sleep and daytime sleepiness. She wakes up nightly between 3 and 5 AM.  She takes trazodone for insomnia.  Concern patient could have underlying obstructive sleep apnea, needs home sleep study evaluate.  We reviewed risks of untreated sleep apnea including cardiac arrhythmias, pulmonary hypertension, diabetes and stroke.  We also discussed treatment options including weight loss, oral appliance, CPAP therapy or referral to ENT for possible surgical options.  Encouraged weight loss and side sleeping position.  Advised against driving if experiencing excessive daytime sleepiness fatigue.  Cautioned against alcohol use or sedatives prior to bedtime as these can worsen underlying sleep apnea.  Follow-up 1 to 2 weeks after sleep study review results and treatment options if needed.

## 2023-01-28 NOTE — Progress Notes (Signed)
@Patient  ID: Heather Hill, female    DOB: [birth year], 67 y.o.   MRN: [id number]  No chief complaint on file.   Referring provider: [name], MD  HPI: 67 year old female, everyday smoker.  Past medical history significant for aortic arthrosclerosis, centrilobular emphysema, hyperlipidemia, depression, nicotine dependence, vitamin D deficiency.  01/28/2023 Patient presents today for sleep consult.  Patient presents today for sleep consult.  She has symptoms of loud snoring and restless sleep.  She wakes up every night between 3 to 5 AM.  She takes trazodone for insomnia.  She takes medication around 830 and typically falls asleep in front of the TV in her recliner.  She will wake up at midnight and then go to bed.  No previous sleep studies.  No concerns for narcolepsy, cataplexy or sleepwalking.  Epworth score 6.  Symptoms- Snoring, restless sleep Previous sleep study results- None Bedtime- falls asleep in recliner around 8:30, wakes up and goes to bed 12am Time to fall asleep- hours Nocturnal awakenings- 3x  Start day/out of bed-5am Weight changes- Up Do you operate heavy machinery- no Do you wear CPAP- no Do you wear oxygen- no Medications- Trazodone 150mg   Epworth- 6  Social history:  Patient is widowed, she has children.  She lives by herself.  She works as a Clinical biochemist.  No recent travel.  She smokes 10 to 15 cigarettes a day.  Occasional alcohol use once a week.   Allergies  Allergen Reactions   Penicillins Hives   Peanut-Containing Drug Products Itching and Rash    Peanuts    Immunization History  Administered Date(s) Administered   Fluad Quad(high Dose 65+) 07/09/2022   Influenza,inj,Quad PF,6+ Mos 06/10/2019   Influenza-Unspecified 06/17/2021   Moderna Sars-Covid-2 Vaccination 10/05/2019, 11/02/2019, 07/26/2020, 06/26/2021   PNEUMOCOCCAL CONJUGATE-20 01/03/2022   PPD Test 05/23/2016, 04/06/2020   Tdap 01/19/2019    Past Medical History:   Diagnosis Date   Anxiety    Depression    Vertigo    none for over 8 yrs   Wears dentures    partial upper    Tobacco History: Social History   Tobacco Use  Smoking Status Every Day   Packs/day: 0.50   Years: 48.00   Additional pack years: 0.00   Total pack years: 24.00   Types: Cigarettes  Smokeless Tobacco Never  Tobacco Comments   10 cigarettes daily- khj 01/28/2023   Ready to quit: Not Answered Counseling given: Not Answered Tobacco comments: 10 cigarettes daily- khj 01/28/2023   Outpatient Medications Prior to Visit  Medication Sig Dispense Refill   acetaminophen (TYLENOL) 500 MG tablet Take 2 tablets (1,000 mg total) by mouth every 6 (six) hours as needed for mild pain.     atorvastatin (LIPITOR) 10 MG tablet TAKE 1 TABLET(10 MG) BY MOUTH DAILY 90 tablet 4   Cholecalciferol 1.25 MG (50000 UT) TABS Take 1 tablet by mouth once a week. 12 tablet 4   meclizine (ANTIVERT) 12.5 MG tablet Take 1 tablet (12.5 mg total) by mouth 3 (three) times daily as needed for dizziness. 60 tablet 0   sertraline (ZOLOFT) 100 MG tablet Take 1 tablet (100 mg total) by mouth at bedtime. Start after completing 50 mg at night for one week 30 tablet 2   traZODone (DESYREL) 150 MG tablet Take 1 tablet (150 mg total) by mouth at bedtime as needed for sleep. 30 tablet 2   No facility-administered medications prior to visit.    Review of Systems  Review of  Systems  Constitutional:  Positive for fatigue.  HENT: Negative.    Respiratory: Negative.    Psychiatric/Behavioral:  Positive for sleep disturbance.     Physical Exam  BP 110/62 (BP Location: Left Arm, Cuff Size: Normal)   Pulse 78   Temp 98 F (36.7 C)   Ht 5' 4.02" (1.626 m)   Wt 193 lb 12.8 oz (87.9 kg)   SpO2 98%   BMI 33.24 kg/m  Physical Exam Constitutional:      Appearance: Normal appearance.  HENT:     Head: Normocephalic and atraumatic.     Mouth/Throat:     Mouth: Mucous membranes are moist.     Pharynx:  Oropharynx is clear.  Cardiovascular:     Rate and Rhythm: Normal rate and regular rhythm.  Pulmonary:     Effort: Pulmonary effort is normal.     Breath sounds: Normal breath sounds. No wheezing, rhonchi or rales.  Musculoskeletal:        General: Normal range of motion.  Skin:    General: Skin is warm and dry.  Neurological:     General: No focal deficit present.     Mental Status: She is alert and oriented to person, place, and time. Mental status is at baseline.  Psychiatric:        Mood and Affect: Mood normal.        Behavior: Behavior normal.        Thought Content: Thought content normal.        Judgment: Judgment normal.      Lab Results:  CBC    Component Value Date/Time   WBC 6.1 07/09/2022 1054   WBC 6.2 04/04/2022 1004   RBC 4.73 07/09/2022 1054   RBC 4.98 04/04/2022 1004   HGB 12.7 07/09/2022 1054   HCT 39.8 07/09/2022 1054   PLT 276 07/09/2022 1054   MCV 84 07/09/2022 1054   MCH 26.8 07/09/2022 1054   MCH 26.5 04/04/2022 1004   MCHC 31.9 07/09/2022 1054   MCHC 31.3 04/04/2022 1004   RDW 14.5 07/09/2022 1054   LYMPHSABS 2.1 07/09/2022 1054   EOSABS 0.2 07/09/2022 1054   BASOSABS 0.1 07/09/2022 1054    BMET    Component Value Date/Time   NA 142 12/31/2022 0920   K 4.1 12/31/2022 0920   CL 102 12/31/2022 0920   CO2 25 12/31/2022 0920   GLUCOSE 106 (H) 12/31/2022 0920   BUN 14 12/31/2022 0920   CREATININE 0.84 12/31/2022 0920   CALCIUM 9.2 12/31/2022 0920   GFRNONAA 71 11/09/2019 1531   GFRAA 82 11/09/2019 1531    BNP No results found for: "BNP"  ProBNP No results found for: "PROBNP"  Imaging: No results found.   Assessment & Plan:   Loud snoring - Patient has symptoms of loud snoring, restless sleep and daytime sleepiness. She wakes up nightly between 3 and 5 AM.  She takes trazodone for insomnia.  Concern patient could have underlying obstructive sleep apnea, needs home sleep study evaluate.  We reviewed risks of untreated sleep  apnea including cardiac arrhythmias, pulmonary hypertension, diabetes and stroke.  We also discussed treatment options including weight loss, oral appliance, CPAP therapy or referral to ENT for possible surgical options.  Encouraged weight loss and side sleeping position.  Advised against driving if experiencing excessive daytime sleepiness fatigue.  Cautioned against alcohol use or sedatives prior to bedtime as these can worsen underlying sleep apnea.  Follow-up 1 to 2 weeks after sleep study review results  and treatment options if needed.     Glenford Bayley, NP 01/28/2023

## 2023-01-30 ENCOUNTER — Other Ambulatory Visit: Payer: Self-pay | Admitting: Acute Care

## 2023-01-30 DIAGNOSIS — F17210 Nicotine dependence, cigarettes, uncomplicated: Secondary | ICD-10-CM

## 2023-01-30 DIAGNOSIS — Z87891 Personal history of nicotine dependence: Secondary | ICD-10-CM

## 2023-01-30 DIAGNOSIS — Z122 Encounter for screening for malignant neoplasm of respiratory organs: Secondary | ICD-10-CM

## 2023-02-02 ENCOUNTER — Ambulatory Visit: Payer: Medicare HMO | Admitting: Family Medicine

## 2023-02-03 ENCOUNTER — Encounter: Payer: Self-pay | Admitting: Family Medicine

## 2023-02-03 ENCOUNTER — Ambulatory Visit (INDEPENDENT_AMBULATORY_CARE_PROVIDER_SITE_OTHER): Payer: Medicare HMO | Admitting: Family Medicine

## 2023-02-03 VITALS — BP 92/62 | HR 65 | Temp 98.4°F | Wt 193.0 lb

## 2023-02-03 DIAGNOSIS — R42 Dizziness and giddiness: Secondary | ICD-10-CM | POA: Diagnosis not present

## 2023-02-03 DIAGNOSIS — R220 Localized swelling, mass and lump, head: Secondary | ICD-10-CM | POA: Diagnosis not present

## 2023-02-03 MED ORDER — MECLIZINE HCL 12.5 MG PO TABS
12.5000 mg | ORAL_TABLET | Freq: Three times a day (TID) | ORAL | 0 refills | Status: DC | PRN
Start: 2023-02-03 — End: 2023-07-14

## 2023-02-03 NOTE — Progress Notes (Signed)
BP 92/62   Pulse 65   Temp 98.4 F (36.9 C) (Oral)   Wt 193 lb (87.5 kg)   SpO2 97%   BMI 33.11 kg/m    Subjective:    Patient ID: Heather Hill, female    DOB: 04-Jan-1956, 67 y.o.   MRN: [id number]  HPI: Heather Hill is a 67 y.o. female  Chief Complaint  Patient presents with   Facial Swelling    Pt states she has been noticing some swelling on both sides of her face for the last 2 to 3 weeks. States the swelling has been constant.    She is taking Meclizine as needed for Vertigo and admits this is helping. Refill given for this.   FACIAL SWELLING She describes the swelling as stable and unchanged from when she first noticed it. She has wore dentures for over 4 years, and admits her dentures will slip sometimes but does not like wearing Fixodent. She denies mouth bleeding, tooth pain, cp, sob, palpitations, recent falls, swelling in other areas, numbness, tingling, and slurred or difficulty speech.   Duration:2-3 weeks Location: left side of the cheek Pain:  no Redness:  no Swelling:  yes Warmth:  no Treatments attempted:none History of trauma in area:  no Fevers:  no Nausea/vomiting:  no   Relevant past medical, surgical, family and social history reviewed and updated as indicated. Interim medical history since our last visit reviewed. Allergies and medications reviewed and updated.  Review of Systems  Constitutional:  Negative for fever.  HENT:  Positive for facial swelling. Negative for congestion, drooling, mouth sores, sinus pressure and sinus pain.   Respiratory: Negative.    Cardiovascular: Negative.   Gastrointestinal:  Negative for nausea and vomiting.  Skin:  Negative for color change and wound.  Neurological:  Negative for dizziness, tremors, seizures, syncope, facial asymmetry, speech difficulty, weakness, light-headedness, numbness and headaches.    Per HPI unless specifically indicated above     Objective:    BP 92/62   Pulse 65    Temp 98.4 F (36.9 C) (Oral)   Wt 193 lb (87.5 kg)   SpO2 97%   BMI 33.11 kg/m   Wt Readings from Last 3 Encounters:  02/03/23 193 lb (87.5 kg)  01/28/23 193 lb 12.8 oz (87.9 kg)  12/31/22 191 lb 11.2 oz (87 kg)    Physical Exam Vitals and nursing note reviewed.  Constitutional:      General: She is awake. She is not in acute distress.    Appearance: Normal appearance. She is well-developed and well-groomed. She is not ill-appearing.  HENT:     Head: Normocephalic and atraumatic.     Salivary Glands: Right salivary gland is not diffusely enlarged or tender. Left salivary gland is not diffusely enlarged or tender.     Right Ear: Hearing and external ear normal. No drainage.     Left Ear: Hearing and external ear normal. No drainage.     Nose: Nose normal.     Mouth/Throat:     Mouth: Mucous membranes are moist. No injury, oral lesions or angioedema.     Dentition: Normal dentition. No dental tenderness, gingival swelling, dental caries, dental abscesses or gum lesions.     Pharynx: Oropharynx is clear.  Eyes:     General: Lids are normal. No visual field deficit.       Right eye: No discharge.        Left eye: No discharge.     Conjunctiva/sclera:  Conjunctivae normal.     Pupils: Pupils are equal, round, and reactive to light.  Neck:     Thyroid: No thyroid tenderness.  Cardiovascular:     Rate and Rhythm: Normal rate and regular rhythm.     Pulses:          Radial pulses are 2+ on the right side and 2+ on the left side.     Heart sounds: Normal heart sounds, S1 normal and S2 normal. No murmur heard.    No gallop.  Pulmonary:     Effort: Pulmonary effort is normal. No accessory muscle usage or respiratory distress.     Breath sounds: Normal breath sounds.  Musculoskeletal:        General: Normal range of motion.     Cervical back: Full passive range of motion without pain and normal range of motion.     Right lower leg: No edema.     Left lower leg: No edema.   Lymphadenopathy:     Cervical: No cervical adenopathy.  Skin:    General: Skin is warm and dry.     Capillary Refill: Capillary refill takes less than 2 seconds.     Findings: No abscess, bruising or erythema.  Neurological:     Mental Status: She is alert and oriented to person, place, and time.     Cranial Nerves: No cranial nerve deficit, dysarthria or facial asymmetry.     Sensory: Sensation is intact.     Motor: No tremor or pronator drift.     Coordination: Romberg sign negative.     Gait: Gait is intact.  Psychiatric:        Attention and Perception: Attention normal.        Mood and Affect: Mood normal.        Speech: Speech normal.        Behavior: Behavior normal. Behavior is cooperative.        Thought Content: Thought content normal.     Results for orders placed or performed in visit on 12/31/22  Comprehensive metabolic panel  Result Value Ref Range   Glucose 106 (H) 70 - 99 mg/dL   BUN 14 8 - 27 mg/dL   Creatinine, Ser 1.61 0.57 - 1.00 mg/dL   eGFR 77 >09 UE/AVW/0.98   BUN/Creatinine Ratio 17 12 - 28   Sodium 142 134 - 144 mmol/L   Potassium 4.1 3.5 - 5.2 mmol/L   Chloride 102 96 - 106 mmol/L   CO2 25 20 - 29 mmol/L   Calcium 9.2 8.7 - 10.3 mg/dL   Total Protein 6.9 6.0 - 8.5 g/dL   Albumin 4.2 3.9 - 4.9 g/dL   Globulin, Total 2.7 1.5 - 4.5 g/dL   Albumin/Globulin Ratio 1.6 1.2 - 2.2   Bilirubin Total 0.3 0.0 - 1.2 mg/dL   Alkaline Phosphatase 108 44 - 121 IU/L   AST 13 0 - 40 IU/L   ALT 20 0 - 32 IU/L  Lipid Panel w/o Chol/HDL Ratio  Result Value Ref Range   Cholesterol, Total 118 100 - 199 mg/dL   Triglycerides 75 0 - 149 mg/dL   HDL 49 >11 mg/dL   VLDL Cholesterol Cal 15 5 - 40 mg/dL   LDL Chol Calc (NIH) 54 0 - 99 mg/dL  HgB B1Y  Result Value Ref Range   Hgb A1c MFr Bld 6.1 (H) 4.8 - 5.6 %   Est. average glucose Bld gHb Est-mCnc 128 mg/dL  VITAMIN D 25 Hydroxy (Vit-D  Deficiency, Fractures)  Result Value Ref Range   Vit D, 25-Hydroxy 54.8  30.0 - 100.0 ng/mL      Assessment & Plan:   Problem List Items Addressed This Visit   None Visit Diagnoses     Left facial swelling    -  Primary   Acute, stable. Recommend f/u with dentist to have dentures realigned/fitted and use of warm compress or ice to the area to help with this.   Vertigo       Chronic, stable. Refill given on Meclizine 12.5mg .   Relevant Medications   meclizine (ANTIVERT) 12.5 MG tablet        Follow up plan: Return if symptoms worsen or fail to improve.

## 2023-02-03 NOTE — Patient Instructions (Addendum)
-  Try a warm compress over the area if you notice increased swelling  [facility] -([phone]: Reach out to clarify medications today.   Placing a denture in water (or a denture cleanser solution) when it is not being worn helps the denture retain its shape, remain pliable and keeps it from drying out. Dentures should never be placed in hot or boiling water, which could cause them to warp. Denture adhesives are not a remedy for ill-fitting dentures, which may need to be relined or replaced to prevent oral sores from developing

## 2023-02-27 NOTE — Progress Notes (Unsigned)
BH MD/PA/NP OP Progress Note  03/03/2023 2:47 PM Heather Hill  MRN:  [id number]  Chief Complaint:  Chief Complaint  Patient presents with   Follow-up   HPI:  This is a follow-up appointment for depression, anxiety and insomnia.  She states that she found out that 2 of her sisters are trying to sell the house, although there was a Will of her mother (she passed away in 12-15-22) that it will be given to her another sister.  She feels very upset about this.  Although she has been stressed over the family, she thinks her mood has been okay.  She spends time in her retreat room, doing computer or watching TV.  She enjoys going to out on the deck.  She is unable to motivate herself.  There are clutters in the house since her husband passed away.  Although she thinks about it, she cannot make herself do things.  She has middle insomnia and feels fatigued in the morning.  She denies change in appetite. The patient has mood symptoms as in PHQ-9/GAD-7. She denies SI. She is planning to contact her sleep provider now that she is able to. She agrees to hold medication change, pending sleep evaluation.   Wt Readings from Last 3 Encounters:  03/03/23 188 lb 3.2 oz (85.4 kg)  02/03/23 193 lb (87.5 kg)  01/28/23 193 lb 12.8 oz (87.9 kg)      Substance use   Tobacco Alcohol Other substances/  Current 1/2 PPD Two glasses of vodka twice a week or less denies  Past   Two glasses of vodka every night (denied DUI/DWI) denies  Past Treatment           Support: Household: by herself Marital status: widow Number of children: 1 daughter (her son deceased at age 58 in 03-15-2002, transferred back from [place]) Employment: CNA part time, [employer], 3 days a week Education: 12th grade, [employer] for two years   Last PCP / ongoing medical evaluation:   Visit Diagnosis:    ICD-10-CM   1. MDD (major depressive disorder), recurrent episode, mild (HCC)  F33.0     2. Insomnia, unspecified type  G47.00       Past  Psychiatric History: Please see initial evaluation for full details. I have reviewed the history. No updates at this time.     Past Medical History:  Past Medical History:  Diagnosis Date   Anxiety    Depression    Sleep apnea    Vertigo    none for over 8 yrs   Wears dentures    partial upper    Past Surgical History:  Procedure Laterality Date   AXILLARY LYMPH NODE BIOPSY Left 04/10/2022   Procedure: AXILLARY LYMPH NODE BIOPSY, excisional;  Surgeon: Henrene Dodge, MD;  Location: [facility];  Service: General;  Laterality: Left;   BREAST BIOPSY Left 03/06/2022   stereo bx, calcs, "COIL" clip-path pending   COLONOSCOPY WITH PROPOFOL N/A 02/02/2019   Procedure: COLONOSCOPY WITH PROPOFOL;  Surgeon: Toney Reil, MD;  Location: [facility];  Service: Endoscopy;  Laterality: N/A;   POLYPECTOMY  02/02/2019   Procedure: POLYPECTOMY;  Surgeon: Toney Reil, MD;  Location: [facility];  Service: Endoscopy;;   TOTAL ABDOMINAL HYSTERECTOMY     TUBAL LIGATION      Family Psychiatric History: Please see initial evaluation for full details. I have reviewed the history. No updates at this time.     Family History:  Family  History  Problem Relation Age of Onset   Diabetes Mother    Alzheimer's disease Mother    Hypertension Father    Asthma Father    Diabetes Father    Bipolar disorder Sister    Diabetes Sister    Breast cancer Maternal Aunt    Diabetes Maternal Grandfather    Alzheimer's disease Maternal Grandfather    Heart disease Maternal Grandfather    Asthma Paternal Grandfather    Diabetes Paternal Grandfather    Hypertension Paternal Grandfather    Diabetes Brother     Social History:  Social History   Socioeconomic History   Marital status: Widowed    Spouse name: Not on file   Number of children: 1   Years of education: Not on file   Highest education level: Some college, no degree  Occupational History   Occupation: retired  Tobacco  Use   Smoking status: Every Day    Packs/day: 0.25    Years: 48.00    Additional pack years: 0.00    Total pack years: 12.00    Types: Cigarettes   Smokeless tobacco: Never   Tobacco comments:    10 cigarettes daily- khj 01/28/2023  Vaping Use   Vaping Use: Former  Substance and Sexual Activity   Alcohol use: Yes    Alcohol/week: 2.0 standard drinks of alcohol    Types: 2 Glasses of wine per week   Drug use: Never   Sexual activity: Not Currently  Other Topics Concern   Not on file  Social History Narrative   Not on file   Social Determinants of Health   Financial Resource Strain: High Risk (12/08/2022)   Overall Financial Resource Strain (CARDIA)    Difficulty of Paying Living Expenses: Hard  Food Insecurity: Food Insecurity Present (12/10/2022)   Hunger Vital Sign    Worried About Running Out of Food in the Last Year: Often true    Ran Out of Food in the Last Year: Often true  Transportation Needs: No Transportation Needs (12/10/2022)   PRAPARE - Administrator, Civil Service (Medical): No    Lack of Transportation (Non-Medical): No  Physical Activity: Insufficiently Active (11/26/2022)   Exercise Vital Sign    Days of Exercise per Week: 3 days    Minutes of Exercise per Session: 30 min  Stress: Stress Concern Present (12/10/2022)   [employer] of Occupational Health - Occupational Stress Questionnaire    Feeling of Stress : Very much  Social Connections: Socially Isolated (12/10/2022)   Social Connection and Isolation Panel [NHANES]    Frequency of Communication with Friends and Family: More than three times a week    Frequency of Social Gatherings with Friends and Family: More than three times a week    Attends Religious Services: Never    Database administrator or Organizations: No    Attends Banker Meetings: Never    Marital Status: Widowed    Allergies:  Allergies  Allergen Reactions   Penicillins Hives   Peanut-Containing Drug  Products Itching and Rash    Peanuts    Metabolic Disorder Labs: Lab Results  Component Value Date   HGBA1C 6.1 (H) 12/31/2022   No results found for: "PROLACTIN" Lab Results  Component Value Date   CHOL 118 12/31/2022   TRIG 75 12/31/2022   HDL 49 12/31/2022   VLDL 16 02/28/2019   LDLCALC 54 12/31/2022   LDLCALC 67 07/09/2022   Lab Results  Component Value Date  TSH 0.940 07/09/2022   TSH 2.060 07/01/2021    Therapeutic Level Labs: No results found for: "LITHIUM" No results found for: "VALPROATE" No results found for: "CBMZ"  Current Medications: Current Outpatient Medications  Medication Sig Dispense Refill   acetaminophen (TYLENOL) 500 MG tablet Take 2 tablets (1,000 mg total) by mouth every 6 (six) hours as needed for mild pain.     atorvastatin (LIPITOR) 10 MG tablet TAKE 1 TABLET(10 MG) BY MOUTH DAILY 90 tablet 4   meclizine (ANTIVERT) 12.5 MG tablet Take 1 tablet (12.5 mg total) by mouth 3 (three) times daily as needed for dizziness. 30 tablet 0   sertraline (ZOLOFT) 100 MG tablet Take 1 tablet (100 mg total) by mouth at bedtime. Start after completing 50 mg at night for one week 30 tablet 2   [START ON 03/29/2023] sertraline (ZOLOFT) 100 MG tablet Take 1 tablet (100 mg total) by mouth daily. 30 tablet 0   [START ON 03/16/2023] traZODone (DESYREL) 150 MG tablet Take 1 tablet (150 mg total) by mouth at bedtime as needed for sleep. 30 tablet 2   No current facility-administered medications for this visit.     Musculoskeletal: Strength & Muscle Tone: within normal limits Gait & Station: normal Patient leans: N/A  Psychiatric Specialty Exam: Review of Systems  Psychiatric/Behavioral:  Positive for dysphoric mood and sleep disturbance. Negative for agitation, behavioral problems, confusion, decreased concentration, hallucinations, self-injury and suicidal ideas. The patient is nervous/anxious. The patient is not hyperactive.   All other systems reviewed and are  negative.   Blood pressure 124/84, pulse (!) 57, temperature 98.5 F (36.9 C), temperature source Temporal, height 5' 4.02" (1.626 m), weight 188 lb 3.2 oz (85.4 kg), SpO2 96 %.Body mass index is 32.28 kg/m.  General Appearance: Fairly Groomed  Eye Contact:  Good  Speech:  Clear and Coherent  Volume:  Normal  Mood:   good  Affect:  Appropriate, Congruent, and calm  Thought Process:  Coherent  Orientation:  Full (Time, Place, and Person)  Thought Content: Logical   Suicidal Thoughts:  No  Homicidal Thoughts:  No  Memory:  Immediate;   Good  Judgement:  Good  Insight:  Good  Psychomotor Activity:  Normal  Concentration:  Concentration: Good and Attention Span: Good  Recall:  Good  Fund of Knowledge: Good  Language: Good  Akathisia:  No  Handed:  Right  AIMS (if indicated): not done  Assets:  Communication Skills Desire for Improvement  ADL's:  Intact  Cognition: WNL  Sleep:  Poor   Screenings: AUDIT    Flowsheet Row Clinical Support from 06/11/2022 in [facility]  Alcohol Use Disorder Identification Test Final Score (AUDIT) 8      GAD-7    Flowsheet Row Office Visit from 03/03/2023 in [facility] Office Visit from 02/03/2023 in [facility] Office Visit from 01/13/2023 in [facility] Office Visit from 12/31/2022 in [facility] Office Visit from 11/26/2022 in [facility]  Total GAD-7 Score 12 6 13 19 21       PHQ2-9    Flowsheet Row Office Visit from 03/03/2023 in [facility] Office Visit from 02/03/2023 in [facility] Office Visit from 01/13/2023 in [facility] Office Visit from 12/31/2022 in [facility] Office Visit from 12/16/2022 in [facility]  PHQ-2 Total Score 3 3 4 6 6   PHQ-9 Total Score 14 9 15 22 23       Flowsheet Row Pre-Admission Testing 45 from 04/02/2022 in [facility] PRE ADMISSION TESTING  C-SSRS RISK CATEGORY No Risk        Assessment and Plan:  AVELIN Hill is a 67 y.o. year old female with a history of depression,s/p hysterectomy, , who is referred for depression.   1. MDD (major depressive disorder), recurrent episode, mild (HCC) Acute stressors include: conflict with her sisters over her mother's will Other stressors include: loss of her husband from [place] in 2021, loss of her son (transferred from [place]) in 2000 at age 55, financial strain,     History: transferred from [facility], was on fluoxetine 40 mg BID, Abilify 5 mg daily, lorazepam 1 mg twice a day- ran out a week prior to the visit Although she reports significant frustration and anxiety due to stressors as above, there has been overall improvement in depressive symptoms since the last visit.  Will continue current dose of sertraline to target depression.  Will consider uptitration if any worsening in her mood symptoms.  She will greatly benefit from CBT, and referral was placed.   2. Insomnia, unspecified type - +snoring - drinks coffee two cups per day every other day She reports middle insomnia.  Will continue trazodone as needed for insomnia.  She is willing to contact the clinic for evaluation of sleep apnea.    # history of alcohol use She reports a history of alcohol use when her husband was alive, who also struggled with alcohol-related issues. We will continue to assess this.    Plan Continue sertraline 100 mg at night  Continue trazodone 150 mg at night as needed for insomnia Referral for evaluation of insomnia Referred to therapy - she will contact the clinic Next appointment: 8/26 at 4 pm for 30 mins, IP   Past trials of medication: fluoxetine, Abilify, lorazepam     The patient demonstrates the following  risk factors for suicide: Chronic risk factors for suicide include: psychiatric disorder of depression . Acute risk factors for suicide include: loss (financial, interpersonal, professional). Protective factors for this patient include: positive social support, responsibility to others (children, family), coping skills, and hope for the future. Considering these factors, the overall suicide risk at this point appears to be low. Patient is appropriate for outpatient follow up.   Collaboration of Care: Collaboration of Care: Other reviewed notes in Epic  Patient/Guardian was advised Release of Information must be obtained prior to any record release in order to collaborate their care with an outside provider. Patient/Guardian was advised if they have not already done so to contact the registration department to sign all necessary forms in order for [place] to release information regarding their care.   Consent: Patient/Guardian gives verbal consent for treatment and assignment of benefits for services provided during this visit. Patient/Guardian expressed understanding and agreed to proceed.    [name], MD 03/03/2023, 2:47 PM

## 2023-03-02 ENCOUNTER — Telehealth: Payer: Medicare HMO

## 2023-03-02 ENCOUNTER — Ambulatory Visit (INDEPENDENT_AMBULATORY_CARE_PROVIDER_SITE_OTHER): Payer: Medicare HMO

## 2023-03-02 DIAGNOSIS — F331 Major depressive disorder, recurrent, moderate: Secondary | ICD-10-CM

## 2023-03-02 DIAGNOSIS — J432 Centrilobular emphysema: Secondary | ICD-10-CM

## 2023-03-02 NOTE — Patient Instructions (Signed)
Please call the care guide team at [phone] if you need to cancel or reschedule your appointment.   If you are experiencing a Mental Health or Behavioral Health Crisis or need someone to talk to, please call the Suicide and Crisis Lifeline: 988 call the [place] National Suicide Prevention Lifeline: (973) 753-9809 or TTY: 3372829058 TTY (510)435-9219) to talk to a trained counselor call 1-800-273-TALK (toll free, 24 hour hotline)   Following is a copy of the CCM Program Consent:  CCM service includes personalized support from designated clinical staff supervised by the physician, including individualized plan of care and coordination with other care providers 24/7 contact phone numbers for assistance for urgent and routine care needs. Service will only be billed when office clinical staff spend 20 minutes or more in a month to coordinate care. Only one practitioner may furnish and bill the service in a calendar month. The patient may stop CCM services at amy time (effective at the end of the month) by phone call to the office staff. The patient will be responsible for cost sharing (co-pay) or up to 20% of the service fee (after annual deductible is met)  Following is a copy of your full provider care plan:   Goals Addressed             This Visit's Progress    CCM Expected Outcome:  Monitor, Self-Manage and Reduce Symptoms [id number]       Current Barriers:  Knowledge Deficits related to resources in the community to help with expressed needs to effectively manage her health and wellbeing Care Coordination needs related to medication cost and mental health resources and support  in a patient with depression Chronic Disease Management support and education needs related to effective management of depression Loss of her husband in 2022, loss of her only son who was in the service in 2003 The patients mother diet on Dec 30, 2022, concerns about the estate and the way her siblings are doing  things.  Planned Interventions: Evaluation of current treatment plan related to depression  and patient's adherence to plan as established by provider. Her mother died 07-May-2024and this is added grief. The last 2 weeks have been more stressful for her because she has two older sisters who are not doing things as her mother requested. Reflective listening and support given. Will continue to monitor for changes.  Advised patient to call the office for changes in mood, anxiety, depression, or changes in her mental health and well being Provided education to patient re: [email or web address] for support of effective management of the grief process with the loss of her husband in 2022,  her son in 2003, and her mother on Dec 30, 2022. Review of the [facility] program and the benefits of connecting with others who have lost loved ones in death. She is going to look into this. [email or web address] has online and in person meetings. Review of how to go to the site and find groups in her area.  Reviewed medications with patient and discussed compliance. The patient states compliance with medications. Is receptive to working with the pharm D for help with medications cost constraints  Provided patient with mindfulness and stress reduction educational materials related to dealing effectively with anxiety and stress.  Reviewed scheduled/upcoming provider appointments including 07-14-2023 at 10 am Care Guide referral for resources in the community for financial constraints, utilities, and food resources. This has been completed Social Work referral for help with questions about social support and if she would qualify for Medicaid. The patient  works with the [employer] on a regular basis Pharmacy referral for help with cost constraints related to medication cost Discussed plans with patient for ongoing care management follow up and provided patient with direct contact information for care management team Advised patient to discuss  changes in mood, anxiety, depression or other mentla health concerns with provider. The patient states that she is doing better the last several days. She is trying not to get overwhelmed by what is going on with her siblings and the settling of her mothers estate. She is sleeping better the last several days and is doing research on her own. She was able to express her anxieties today and thankful for the support. Will continue to monitor for changes.  Screening for signs and symptoms of depression related to chronic disease state  Assessed social determinant of health barriers   Symptom Management: Take medications as prescribed   Attend all scheduled provider appointments Call provider office for new concerns or questions  call the Suicide and Crisis Lifeline: 988 call the [place] National Suicide Prevention Lifeline: 909-496-3695 or TTY: 551-413-2226 TTY (678) 394-7424) to talk to a trained counselor call 1-800-273-TALK (toll free, 24 hour hotline) if experiencing a Mental Health or Behavioral Health Crisis   Follow Up Plan: Telephone follow up appointment with care management team member scheduled for: 05-04-2023 at 0900 am       CCM:  Maintain, Monitor and Self-Manage Symptoms of COPD       Current Barriers:  Knowledge Deficits related to the importance of smoking cessation in a patient with COPD, support and education for resources to help with management of COPD and other chronic conditions Care Coordination needs related to medication cost and community resources needed in a patient with COPD Chronic Disease Management support and education needs related to Effective management of COPD Current everyday smoker. She discussed wanting to stop smoking today (01-20-2023)  Planned Interventions: Provided patient with basic written and verbal COPD education on self care/management/and exacerbation prevention. The patient denies any changes in her breathing. She is staying in and not going out  unless she has to do so. She wants to stop smoking. In basket message sent to the pcp asking for assistance with nicotine patches. Discussed ways to help her cut back on smoking when she has the urge to smoke. Education and support provided. Will provide additional information in the AVS for smoking cessation Advised patient to track and manage COPD triggers Provided instruction about proper use of medications used for management of COPD including inhalers Advised patient to self assesses COPD action plan zone and make appointment with provider if in the yellow zone for 48 hours without improvement Advised patient to engage in light exercise as tolerated 3-5 days a week to aid in the the management of COPD Provided education about and advised patient to utilize infection prevention strategies to reduce risk of respiratory infection. Review and education given.  Discussed the importance of adequate rest and management of fatigue with COPD Referral made to community resources care guide team for assistance with resources in the community for food insecurities and financial constraints  Screening for signs and symptoms of depression related to chronic disease state  Assessed social determinant of health barriers Discussed smoking cessation and discussing options with the pcp about smoking cessation. The patient states that she has been smoking since she was in the 10th grade of high school. Is ready to talk about smoking cessation. In basket message sent to the pcp for an  order for nicotine patches/ The patient needs a sleep study. She has paperwork to fill out to help her with getting an in home sleep study. Education provided and will attach information to AVS on sleep apnea. Discussed ways to improve her sleep hygiene. Will continue to monitor for changes and needs.   Symptom Management: Take medications as prescribed   Attend all scheduled provider appointments Call provider office for new concerns  or questions  call the Suicide and Crisis Lifeline: 988 call the [place] National Suicide Prevention Lifeline: 636 148 4866 or TTY: 262 852 8508 TTY (484) 417-5827) to talk to a trained counselor call 1-800-273-TALK (toll free, 24 hour hotline) if experiencing a Mental Health or Behavioral Health Crisis  eliminate smoking in my home identify and remove indoor air pollutants limit outdoor activity during cold weather listen for public air quality announcements every day do breathing exercises every day develop a rescue plan eliminate symptom triggers at home follow rescue plan if symptoms flare-up  Follow Up Plan: Telephone follow up appointment with care management team member scheduled for: 05-04-2023 at 0900 am          Patient verbalizes understanding of instructions and care plan provided today and agrees to view in MyChart. Active MyChart status and patient understanding of how to access instructions and care plan via MyChart confirmed with patient.  Telephone follow up appointment with care management team member scheduled for: 05-04-2023 at 0900 am  Healthy Living: What to Ask Your Health Care Team About Sleep Sleep is an important part of a healthy lifestyle. You will learn questions to ask your health care team about sleep. To view the content, go to this web address: [email or web address]  This video will expire on: 10/22/2024. If you need access to this video following this date, please reach out to the healthcare provider who assigned it to you. This information is not intended to replace advice given to you by your health care provider. Make sure you discuss any questions you have with your health care provider. Elsevier Patient Education  2024 Elsevier Inc. Nicotine Patches What is this medication? NICOTINE (NIK [place] teen) helps you quit smoking. It reduces cravings for nicotine, the addictive substance found in tobacco. It may also help reduce symptoms of  withdrawal. It is most effective when used in combination with a stop-smoking program. This medicine may be used for other purposes; ask your health care provider or pharmacist if you have questions. COMMON BRAND NAME(S): Habitrol, Nicoderm CQ, Nicotrol What should I tell my care team before I take this medication? They need to know if you have any of these conditions: Diabetes Heart disease, angina, irregular heartbeat or previous heart attack High blood pressure Lung disease, including asthma Overactive thyroid Pheochromocytoma Seizures or a history of seizures Skin problems, like eczema Stomach problems or ulcers An unusual or allergic reaction to nicotine, adhesives, other medications, foods, dyes, or preservatives Pregnant or trying to get pregnant Breast-feeding How should I use this medication? This medication is for external use only. Follow the directions on the label. Do not cut or trim the patch. After applying the patch, wash your hands. Change the patch every day in the morning, keeping to a regular schedule. When you apply a new patch, use a new area of skin. Wait at least 1 week before using the same area again. This medication comes with INSTRUCTIONS FOR USE. Ask your pharmacist for directions on how to use this medication. Read the information carefully. Talk to your pharmacist or care team  if you have questions. Talk to your care team about the use of this medication in children. Special care may be needed. Overdosage: If you think you have taken too much of this medicine contact a poison control center or emergency room at once. NOTE: This medicine is only for you. Do not share this medicine with others. What if I miss a dose? If you forget to replace a patch, use it as soon as you can. Only use one patch at a time and do not leave on the skin for longer than directed. If a patch falls off, you can replace it, but keep to your schedule and remove the patch at the right  time. What may interact with this medication? Certain medications for lung or breathing disease, like asthma Medications for blood pressure Medications for depression This list may not describe all possible interactions. Give your health care provider a list of all the medicines, herbs, non-prescription drugs, or dietary supplements you use. Also tell them if you smoke, drink alcohol, or use illegal drugs. Some items may interact with your medicine. What should I watch for while using this medication? Visit your care team for regular checks on your progress. Talk to your care team about what you can do to improve your chances of quitting. If you are going to need surgery, an MRI, CT scan, or other procedure, tell your care team that you are using this medication. You may need to remove the patch before the procedure. What side effects may I notice from receiving this medication? Side effects that you should report to your care team as soon as possible: Allergic reactions--skin rash, itching, hives, swelling of the face, lips, tongue, or throat Heart palpitations--rapid, pounding, or irregular heartbeat Increase in blood pressure Side effects that usually do not require medical attention (report to your care team if they continue or are bothersome): Dizziness Headache Heartburn Hiccups Irritation at application site Trouble sleeping This list may not describe all possible side effects. Call your doctor for medical advice about side effects. You may report side effects to FDA at 1-800-FDA-1088. Where should I keep my medication? This product may have enough nicotine in it to make children and pets sick. Keep it away from children and pets. After using, throw away as directed on the package. Store at room temperature between 20 and 25 degrees C (68 and 77 degrees F). Protect from heat and light. Keep this medication in the original container until ready to use. Throw away unused medication  after the expiration date. NOTE: This sheet is a summary. It may not cover all possible information. If you have questions about this medicine, talk to your doctor, pharmacist, or health care provider.  2024 Elsevier/Gold Standard (2021-07-12 00:00:00) Managing the Challenge of Quitting Smoking Quitting smoking is a physical and mental challenge. You may have cravings, withdrawal symptoms, and temptation to smoke. Before quitting, work with your health care provider to make a plan that can help you manage quitting. Making a plan before you quit may keep you from smoking when you have the urge to smoke while trying to quit. How to manage lifestyle changes Managing stress Stress can make you want to smoke, and wanting to smoke may cause stress. It is important to find ways to manage your stress. You could try some of the following: Practice relaxation techniques. Breathe slowly and deeply, in through your nose and out through your mouth. Listen to music. Soak in a bath or take a shower.  Imagine a peaceful place or vacation. Get some support. Talk with family or friends about your stress. Join a support group. Talk with a counselor or therapist. Get some physical activity. Go for a walk, run, or bike ride. Play a favorite sport. Practice yoga.  Medicines Talk with your health care provider about medicines that might help you deal with cravings and make quitting easier for you. Relationships Social situations can be difficult when you are quitting smoking. To manage this, you can: Avoid parties and other social situations where people might be smoking. Avoid alcohol. Leave right away if you have the urge to smoke. Explain to your family and friends that you are quitting smoking. Ask for support and let them know you might be a bit grumpy. Plan activities where smoking is not an option. General instructions Be aware that many people gain weight after they quit smoking. However, not  everyone does. To keep from gaining weight, have a plan in place before you quit, and stick to the plan after you quit. Your plan should include: Eating healthy snacks. When you have a craving, it may help to: Eat popcorn, or try carrots, celery, or other cut vegetables. Chew sugar-free gum. Changing how you eat. Eat small portion sizes at meals. Eat 4-6 small meals throughout the day instead of 1-2 large meals a day. Be mindful when you eat. You should avoid watching television or doing other things that might distract you as you eat. Exercising regularly. Make time to exercise each day. If you do not have time for a long workout, do short bouts of exercise for 5-10 minutes several times a day. Do some form of strengthening exercise, such as weight lifting. Do some exercise that gets your heart beating and causes you to breathe deeply, such as walking fast, running, swimming, or biking. This is very important. Drinking plenty of water or other low-calorie or no-calorie drinks. Drink enough fluid to keep your urine pale yellow.  How to recognize withdrawal symptoms Your body and mind may experience discomfort as you try to get used to not having nicotine in your system. These effects are called withdrawal symptoms. They may include: Feeling hungrier than normal. Having trouble concentrating. Feeling irritable or restless. Having trouble sleeping. Feeling depressed. Craving a cigarette. These symptoms may surprise you, but they are normal to have when quitting smoking. To manage withdrawal symptoms: Avoid places, people, and activities that trigger your cravings. Remember why you want to quit. Get plenty of sleep. Avoid coffee and other drinks that contain caffeine. These may worsen some of your symptoms. How to manage cravings Come up with a plan for how to deal with your cravings. The plan should include the following: A definition of the specific situation you want to deal with. An  activity or action you will take to replace smoking. A clear idea for how this action will help. The name of someone who could help you with this. Cravings usually last for 5-10 minutes. Consider taking the following actions to help you with your plan to deal with cravings: Keep your mouth busy. Chew sugar-free gum. Suck on hard candies or a straw. Brush your teeth. Keep your hands and body busy. Change to a different activity right away. Squeeze or play with a ball. Do an activity or a hobby, such as making bead jewelry, practicing needlepoint, or working with wood. Mix up your normal routine. Take a short exercise break. Go for a quick walk, or run up and down  stairs. Focus on doing something kind or helpful for someone else. Call a friend or family member to talk during a craving. Join a support group. Contact a quitline. Where to find support To get help or find a support group: Call the National Cancer Institute's Smoking Quitline: 1-800-QUIT-NOW (832)352-2200) Text QUIT to SmokefreeTXT: 454098 Where to find more information Visit these websites to find more information on quitting smoking: U.S. Department of Health and Human Services: www.smokefree.gov American Lung Association: www.freedomfromsmoking.org Centers for Disease Control and Prevention (CDC): FootballExhibition.com.br American Heart Association: www.heart.org Contact a health care provider if: You want to change your plan for quitting. The medicines you are taking are not helping. Your eating feels out of control or you cannot sleep. You feel depressed or become very anxious. Summary Quitting smoking is a physical and mental challenge. You will face cravings, withdrawal symptoms, and temptation to smoke again. Preparation can help you as you go through these challenges. Try different techniques to manage stress, handle social situations, and prevent weight gain. You can deal with cravings by keeping your mouth busy (such as by  chewing gum), keeping your hands and body busy, calling family or friends, or contacting a quitline for people who want to quit smoking. You can deal with withdrawal symptoms by avoiding places where people smoke, getting plenty of rest, and avoiding drinks that contain caffeine. This information is not intended to replace advice given to you by your health care provider. Make sure you discuss any questions you have with your health care provider. Document Revised: 08/02/2021 Document Reviewed: 08/02/2021 Elsevier Patient Education  2024 [employer].

## 2023-03-02 NOTE — Chronic Care Management (AMB) (Signed)
Chronic Care Management   CCM RN Visit Note  03/02/2023 Name: Heather Hill MRN: [id number] DOB: 1955-12-08  Subjective: Heather Hill is a 67 y.o. year old female who is a primary care patient of Cannady, Dorie Rank, NP. The patient was referred to the Chronic Care Management team for assistance with care management needs subsequent to provider initiation of CCM services and plan of care.    Today's Visit:  Engaged with patient by telephone for follow up visit.        Goals Addressed             This Visit's Progress    CCM Expected Outcome:  Monitor, Self-Manage and Reduce Symptoms [id number]       Current Barriers:  Knowledge Deficits related to resources in the community to help with expressed needs to effectively manage her health and wellbeing Care Coordination needs related to medication cost and mental health resources and support  in a patient with depression Chronic Disease Management support and education needs related to effective management of depression Loss of her husband in 2022, loss of her only son who was in the service in 2003 The patients mother diet on December 23, 2022, concerns about the estate and the way her siblings are doing things.  Planned Interventions: Evaluation of current treatment plan related to depression  and patient's adherence to plan as established by provider. Her mother died 04/30/2024and this is added grief. The last 2 weeks have been more stressful for her because she has two older sisters who are not doing things as her mother requested. Reflective listening and support given. Will continue to monitor for changes.  Advised patient to call the office for changes in mood, anxiety, depression, or changes in her mental health and well being Provided education to patient re: Grief[employer].org for support of effective management of the grief process with the loss of her husband in 2022,  her son in 2003, and her mother on 12/23/22. Review of the  [place] program and the benefits of connecting with others who have lost loved ones in death. She is going to look into this. [email or web address] has online and in person meetings. Review of how to go to the site and find groups in her area.  Reviewed medications with patient and discussed compliance. The patient states compliance with medications. Is receptive to working with the pharm D for help with medications cost constraints  Provided patient with mindfulness and stress reduction educational materials related to dealing effectively with anxiety and stress.  Reviewed scheduled/upcoming provider appointments including 07-14-2023 at 10 am Care Guide referral for resources in the community for financial constraints, utilities, and food resources. This has been completed Social Work referral for help with questions about social support and if she would qualify for Medicaid. The patient works with the LCSW on a regular basis Pharmacy referral for help with cost constraints related to medication cost Discussed plans with patient for ongoing care management follow up and provided patient with direct contact information for care management team Advised patient to discuss changes in mood, anxiety, depression or other mentla health concerns with provider. The patient states that she is doing better the last several days. She is trying not to get overwhelmed by what is going on with her siblings and the settling of her mothers estate. She is sleeping better the last several days and is doing research on her own. She was able to express her anxieties today and thankful for the support.  Will continue to monitor for changes.  Screening for signs and symptoms of depression related to chronic disease state  Assessed social determinant of health barriers   Symptom Management: Take medications as prescribed   Attend all scheduled provider appointments Call provider office for new concerns or questions  call the  Suicide and Crisis Lifeline: 988 call the [place] National Suicide Prevention Lifeline: 803-699-2474 or TTY: (559)453-7659 TTY 617-296-4302) to talk to a trained counselor call 1-800-273-TALK (toll free, 24 hour hotline) if experiencing a Mental Health or Behavioral Health Crisis   Follow Up Plan: Telephone follow up appointment with care management team member scheduled for: 05-04-2023 at 0900 am       CCM:  Maintain, Monitor and Self-Manage Symptoms of COPD       Current Barriers:  Knowledge Deficits related to the importance of smoking cessation in a patient with COPD, support and education for resources to help with management of COPD and other chronic conditions Care Coordination needs related to medication cost and community resources needed in a patient with COPD Chronic Disease Management support and education needs related to Effective management of COPD Current everyday smoker. She discussed wanting to stop smoking today (01-20-2023)  Planned Interventions: Provided patient with basic written and verbal COPD education on self care/management/and exacerbation prevention. The patient denies any changes in her breathing. She is staying in and not going out unless she has to do so. She wants to stop smoking. In basket message sent to the pcp asking for assistance with nicotine patches. Discussed ways to help her cut back on smoking when she has the urge to smoke. Education and support provided. Will provide additional information in the AVS for smoking cessation Advised patient to track and manage COPD triggers Provided instruction about proper use of medications used for management of COPD including inhalers Advised patient to self assesses COPD action plan zone and make appointment with provider if in the yellow zone for 48 hours without improvement Advised patient to engage in light exercise as tolerated 3-5 days a week to aid in the the management of COPD Provided education about and  advised patient to utilize infection prevention strategies to reduce risk of respiratory infection. Review and education given.  Discussed the importance of adequate rest and management of fatigue with COPD Referral made to community resources care guide team for assistance with resources in the community for food insecurities and financial constraints  Screening for signs and symptoms of depression related to chronic disease state  Assessed social determinant of health barriers Discussed smoking cessation and discussing options with the pcp about smoking cessation. The patient states that she has been smoking since she was in the 10th grade of high school. Is ready to talk about smoking cessation. In basket message sent to the pcp for an order for nicotine patches/ The patient needs a sleep study. She has paperwork to fill out to help her with getting an in home sleep study. Education provided and will attach information to AVS on sleep apnea. Discussed ways to improve her sleep hygiene. Will continue to monitor for changes and needs.   Symptom Management: Take medications as prescribed   Attend all scheduled provider appointments Call provider office for new concerns or questions  call the Suicide and Crisis Lifeline: 988 call the [place] National Suicide Prevention Lifeline: (404)390-7156 or TTY: 360-685-0193 TTY 380-560-3638) to talk to a trained counselor call 1-800-273-TALK (toll free, 24 hour hotline) if experiencing a Mental Health or Behavioral Health Crisis  eliminate smoking in my home identify and remove indoor air pollutants limit outdoor activity during cold weather listen for public air quality announcements every day do breathing exercises every day develop a rescue plan eliminate symptom triggers at home follow rescue plan if symptoms flare-up  Follow Up Plan: Telephone follow up appointment with care management team member scheduled for: 05-04-2023 at 0900 am           Plan:Telephone follow up appointment with care management team member scheduled for:  05-04-2023 at 9 am  Alto Denver RN, MSN, CCM RN Care Manager  Chronic Care Management Direct Number: [phone]

## 2023-03-03 ENCOUNTER — Encounter: Payer: Self-pay | Admitting: Psychiatry

## 2023-03-03 ENCOUNTER — Ambulatory Visit (INDEPENDENT_AMBULATORY_CARE_PROVIDER_SITE_OTHER): Payer: Medicare HMO | Admitting: Psychiatry

## 2023-03-03 VITALS — BP 124/84 | HR 57 | Temp 98.5°F | Ht 64.02 in | Wt 188.2 lb

## 2023-03-03 DIAGNOSIS — G4700 Insomnia, unspecified: Secondary | ICD-10-CM | POA: Diagnosis not present

## 2023-03-03 DIAGNOSIS — F330 Major depressive disorder, recurrent, mild: Secondary | ICD-10-CM | POA: Diagnosis not present

## 2023-03-03 MED ORDER — SERTRALINE HCL 100 MG PO TABS: 100.0000 mg | ORAL_TABLET | Freq: Every day | ORAL | 0 refills | Status: DC

## 2023-03-03 MED ORDER — TRAZODONE HCL 150 MG PO TABS: 150.0000 mg | ORAL_TABLET | Freq: Every evening | ORAL | 2 refills | Status: DC | PRN

## 2023-03-25 DIAGNOSIS — J449 Chronic obstructive pulmonary disease, unspecified: Secondary | ICD-10-CM | POA: Diagnosis not present

## 2023-03-25 DIAGNOSIS — F32A Depression, unspecified: Secondary | ICD-10-CM

## 2023-03-25 DIAGNOSIS — F17210 Nicotine dependence, cigarettes, uncomplicated: Secondary | ICD-10-CM | POA: Diagnosis not present

## 2023-04-13 NOTE — Progress Notes (Unsigned)
BH MD/PA/NP OP Progress Note  04/13/2023 3:20 PM Heather Hill  MRN:  [id number]  Chief Complaint: No chief complaint on file.  HPI: *** Visit Diagnosis: No diagnosis found.  Past Psychiatric History: Please see initial evaluation for full details. I have reviewed the history. No updates at this time.     Past Medical History:  Past Medical History:  Diagnosis Date   Anxiety    Depression    Sleep apnea    Vertigo    none for over 8 yrs   Wears dentures    partial upper    Past Surgical History:  Procedure Laterality Date   AXILLARY LYMPH NODE BIOPSY Left 04/10/2022   Procedure: AXILLARY LYMPH NODE BIOPSY, excisional;  Surgeon: Henrene Dodge, MD;  Location: [facility];  Service: General;  Laterality: Left;   BREAST BIOPSY Left 03/06/2022   stereo bx, calcs, "COIL" clip-path pending   COLONOSCOPY WITH PROPOFOL N/A 02/02/2019   Procedure: COLONOSCOPY WITH PROPOFOL;  Surgeon: Toney Reil, MD;  Location: [facility];  Service: Endoscopy;  Laterality: N/A;   POLYPECTOMY  02/02/2019   Procedure: POLYPECTOMY;  Surgeon: Toney Reil, MD;  Location: [facility];  Service: Endoscopy;;   TOTAL ABDOMINAL HYSTERECTOMY     TUBAL LIGATION      Family Psychiatric History: Please see initial evaluation for full details. I have reviewed the history. No updates at this time.    Family History:  Family History  Problem Relation Age of Onset   Diabetes Mother    Alzheimer's disease Mother    Hypertension Father    Asthma Father    Diabetes Father    Bipolar disorder Sister    Diabetes Sister    Breast cancer Maternal Aunt    Diabetes Maternal Grandfather    Alzheimer's disease Maternal Grandfather    Heart disease Maternal Grandfather    Asthma Paternal Grandfather    Diabetes Paternal Grandfather    Hypertension Paternal Grandfather    Diabetes Brother     Social History:  Social History   Socioeconomic History   Marital status: Widowed     Spouse name: Not on file   Number of children: 1   Years of education: Not on file   Highest education level: Some college, no degree  Occupational History   Occupation: retired  Tobacco Use   Smoking status: Every Day    Current packs/day: 0.25    Average packs/day: 0.3 packs/day for 48.0 years (12.0 ttl pk-yrs)    Types: Cigarettes   Smokeless tobacco: Never   Tobacco comments:    10 cigarettes daily- khj 01/28/2023  Vaping Use   Vaping status: Former  Substance and Sexual Activity   Alcohol use: Yes    Alcohol/week: 2.0 standard drinks of alcohol    Types: 2 Glasses of wine per week   Drug use: Never   Sexual activity: Not Currently  Other Topics Concern   Not on file  Social History Narrative   Not on file   Social Determinants of Health   Financial Resource Strain: High Risk (12/08/2022)   Overall Financial Resource Strain (CARDIA)    Difficulty of Paying Living Expenses: Hard  Food Insecurity: Food Insecurity Present (12/10/2022)   Hunger Vital Sign    Worried About Running Out of Food in the Last Year: Often true    Ran Out of Food in the Last Year: Often true  Transportation Needs: No Transportation Needs (12/10/2022)   PRAPARE - Transportation  Lack of Transportation (Medical): No    Lack of Transportation (Non-Medical): No  Physical Activity: Insufficiently Active (11/26/2022)   Exercise Vital Sign    Days of Exercise per Week: 3 days    Minutes of Exercise per Session: 30 min  Stress: Stress Concern Present (12/10/2022)   [employer] of Occupational Health - Occupational Stress Questionnaire    Feeling of Stress : Very much  Social Connections: Socially Isolated (12/10/2022)   Social Connection and Isolation Panel [NHANES]    Frequency of Communication with Friends and Family: More than three times a week    Frequency of Social Gatherings with Friends and Family: More than three times a week    Attends Religious Services: Never    Database administrator  or Organizations: No    Attends Banker Meetings: Never    Marital Status: Widowed    Allergies:  Allergies  Allergen Reactions   Penicillins Hives   Peanut-Containing Drug Products Itching and Rash    Peanuts    Metabolic Disorder Labs: Lab Results  Component Value Date   HGBA1C 6.1 (H) 12/31/2022   No results found for: "PROLACTIN" Lab Results  Component Value Date   CHOL 118 12/31/2022   TRIG 75 12/31/2022   HDL 49 12/31/2022   VLDL 16 02/28/2019   LDLCALC 54 12/31/2022   LDLCALC 67 07/09/2022   Lab Results  Component Value Date   TSH 0.940 07/09/2022   TSH 2.060 07/01/2021    Therapeutic Level Labs: No results found for: "LITHIUM" No results found for: "VALPROATE" No results found for: "CBMZ"  Current Medications: Current Outpatient Medications  Medication Sig Dispense Refill   acetaminophen (TYLENOL) 500 MG tablet Take 2 tablets (1,000 mg total) by mouth every 6 (six) hours as needed for mild pain.     atorvastatin (LIPITOR) 10 MG tablet TAKE 1 TABLET(10 MG) BY MOUTH DAILY 90 tablet 4   meclizine (ANTIVERT) 12.5 MG tablet Take 1 tablet (12.5 mg total) by mouth 3 (three) times daily as needed for dizziness. 30 tablet 0   sertraline (ZOLOFT) 100 MG tablet Take 1 tablet (100 mg total) by mouth at bedtime. Start after completing 50 mg at night for one week 30 tablet 2   sertraline (ZOLOFT) 100 MG tablet Take 1 tablet (100 mg total) by mouth daily. 30 tablet 0   traZODone (DESYREL) 150 MG tablet Take 1 tablet (150 mg total) by mouth at bedtime as needed for sleep. 30 tablet 2   No current facility-administered medications for this visit.     Musculoskeletal: Strength & Muscle Tone:  normal Gait & Station: normal Patient leans: N/A  Psychiatric Specialty Exam: Review of Systems  There were no vitals taken for this visit.There is no height or weight on file to calculate BMI.  General Appearance: {Appearance:22683}  Eye Contact:  {BHH EYE  CONTACT:22684}  Speech:  Clear and Coherent  Volume:  Normal  Mood:  {BHH MOOD:22306}  Affect:  {Affect (PAA):22687}  Thought Process:  Coherent  Orientation:  Full (Time, Place, and Person)  Thought Content: Logical   Suicidal Thoughts:  {ST/HT (PAA):22692}  Homicidal Thoughts:  {ST/HT (PAA):22692}  Memory:  Immediate;   Good  Judgement:  {Judgement (PAA):22694}  Insight:  {Insight (PAA):22695}  Psychomotor Activity:  Normal  Concentration:  Concentration: Good and Attention Span: Good  Recall:  Good  Fund of Knowledge: Good  Language: Good  Akathisia:  No  Handed:  Right  AIMS (if indicated): not done  Assets:  Communication Skills Desire for Improvement  ADL's:  Intact  Cognition: WNL  Sleep:  {BHH GOOD/FAIR/POOR:22877}   Screenings: AUDIT    Flowsheet Row Clinical Support from 06/11/2022 in [facility]  Alcohol Use Disorder Identification Test Final Score (AUDIT) 8      GAD-7    Flowsheet Row Office Visit from 03/03/2023 in [facility] Office Visit from 02/03/2023 in [facility] Office Visit from 01/13/2023 in [facility] Office Visit from 12/31/2022 in [facility] Office Visit from 11/26/2022 in [facility]  Total GAD-7 Score 12 6 13 19 21       PHQ2-9    Flowsheet Row Office Visit from 03/03/2023 in [facility] Office Visit from 02/03/2023 in [facility] Office Visit from 01/13/2023 in [facility] Office Visit from 12/31/2022 in [facility] Office Visit from 12/16/2022 in [facility]  PHQ-2 Total Score 3 3 4 6 6   PHQ-9 Total Score 14 9 15 22 23       Flowsheet Row Pre-Admission Testing 45 from 04/02/2022 in [facility] PRE ADMISSION TESTING  C-SSRS RISK CATEGORY No Risk        Assessment and Plan:  Heather Hill is a 67 y.o. year old female with a history of depression,s/p hysterectomy, , who is referred for depression.    1. MDD (major depressive disorder), recurrent episode, mild (HCC) Acute stressors include: conflict with her sisters over her mother's will Other stressors include: loss of her husband from [place] in 2021, loss of her son (transferred from [place]) in 2000 at age 92, financial strain,     History: transferred from [facility], was on fluoxetine 40 mg BID, Abilify 5 mg daily, lorazepam 1 mg twice a day- ran out a week prior to the visit Although she reports significant frustration and anxiety due to stressors as above, there has been overall improvement in depressive symptoms since the last visit.  Will continue current dose of sertraline to target depression.  Will consider uptitration if any worsening in her mood symptoms.  She will greatly benefit from CBT, and referral was placed.    2. Insomnia, unspecified type - +snoring - drinks coffee two cups per day every other day She reports middle insomnia.  Will continue trazodone as needed for insomnia.  She is willing to contact the clinic for evaluation of sleep apnea.    # history of alcohol use She reports a history of alcohol use when her husband was alive, who also struggled with alcohol-related issues. We will continue to assess this.    Plan Continue sertraline 100 mg at night  Continue trazodone 150 mg at night as needed for insomnia Referral for evaluation of insomnia Referred to therapy - she will contact the clinic Next appointment: 8/26 at 4 pm for 30 mins, IP   Past trials of medication: fluoxetine, Abilify, lorazepam     The patient demonstrates the following risk factors for suicide: Chronic risk factors for suicide include: psychiatric disorder of depression . Acute risk factors for suicide include: loss  (financial, interpersonal, professional). Protective factors for this patient include: positive social support, responsibility to others (children, family), coping skills, and hope for the future. Considering these factors, the overall suicide risk at this point appears to be low. Patient is  appropriate for outpatient follow up.   Collaboration of Care: Collaboration of Care: {BH OP Collaboration of Care:21014065}  Patient/Guardian was advised Release of Information must be obtained prior to any record release in order to collaborate their care with an outside provider. Patient/Guardian was advised if they have not already done so to contact the registration department to sign all necessary forms in order for [place] to release information regarding their care.   Consent: Patient/Guardian gives verbal consent for treatment and assignment of benefits for services provided during this visit. Patient/Guardian expressed understanding and agreed to proceed.    [name], MD 04/13/2023, 3:20 PM

## 2023-04-19 ENCOUNTER — Other Ambulatory Visit: Payer: Self-pay | Admitting: Psychiatry

## 2023-04-20 ENCOUNTER — Ambulatory Visit (INDEPENDENT_AMBULATORY_CARE_PROVIDER_SITE_OTHER): Payer: Medicare HMO | Admitting: Psychiatry

## 2023-04-20 DIAGNOSIS — Z91199 Patient's noncompliance with other medical treatment and regimen due to unspecified reason: Secondary | ICD-10-CM

## 2023-04-21 NOTE — Telephone Encounter (Signed)
 The refill has been ordered as requested. Please contact the patient to schedule a follow-up visit.

## 2023-04-21 NOTE — Telephone Encounter (Signed)
Mailbox is full. Mychart message sent to call and schedule

## 2023-05-04 ENCOUNTER — Other Ambulatory Visit: Payer: Self-pay

## 2023-05-04 ENCOUNTER — Other Ambulatory Visit: Payer: Medicare HMO

## 2023-05-04 ENCOUNTER — Telehealth: Payer: Medicare HMO

## 2023-05-04 NOTE — Patient Outreach (Signed)
Care Management   Visit Note  05/04/2023 Name: Heather Hill MRN: [id number] DOB: February 15, 1956  Subjective: Heather Hill is a 67 y.o. year old female who is a primary care patient of Cannady, Dorie Rank, NP. The Care Management team was consulted for assistance.      Engaged with patient spoke with patient by telephone.    Goals Addressed             This Visit's Progress    RNCM Care Management Expected Outcome:  Monitor, Self-Manage and Reduce Symptoms [id number]       Current Barriers:  Knowledge Deficits related to resources in the community to help with expressed needs to effectively manage her health and wellbeing Care Coordination needs related to medication cost and mental health resources and support  in a patient with depression Chronic Disease Management support and education needs related to effective management of depression Loss of her husband in 2022, loss of her only son who was in the service in 2003 The patients mother diet on Jan 06, 2023, concerns about the estate and the way her siblings are doing things.  Planned Interventions: Evaluation of current treatment plan related to depression  and patient's adherence to plan as established by provider. Her mother died 2024-05-14and this is added grief. The patient is doing much better than on previous outreaches. For about a month she has been working full time and this is helping her "financials". She feels like she is in a lot better place now. She works from 7 to 2 and she says this is really helping her. She does want to quit smoking and this is something she wants to talk to the pcp about. She thinks the patches will help her. She says she is "happy" and this means a lot to her.  Advised patient to call the office for changes in mood, anxiety, depression, or changes in her mental health and well being Provided education to patient re: [email or web address] for support of effective management of the grief process with  the loss of her husband in 2022,  her son in 2003, and her mother on 2023-01-06. Review of the [place] program and the benefits of connecting with others who have lost loved ones in death. She is going to look into this. [email or web address] has online and in person meetings. Review of how to go to the site and find groups in her area.  Reviewed medications with patient and discussed compliance. The patient states compliance with medications. Is receptive to working with the pharm D for help with medications cost constraints  Provided patient with mindfulness and stress reduction educational materials related to dealing effectively with anxiety and stress.  Reviewed scheduled/upcoming provider appointments including 07-14-2023 at 10 am. She missed her appointment with the [facility] recently due to  her care broken down. She has rescheduled this appointment. She states she will go back to see them soon.  Care Guide referral for resources in the community for financial constraints, utilities, and food resources. This has been completed. The patient has resources and is thankful and happy to report she has food in her freezer.  Social Work referral for help with questions about social support and if she would qualify for Medicaid. The patient works with the LCSW on a regular basis Pharmacy referral for help with cost constraints related to medication cost Discussed plans with patient for ongoing care management follow up and provided patient with direct contact information for care management team Advised patient to  discuss changes in mood, anxiety, depression or other mentla health concerns with provider. The patient states that she is doing better the last several days. She is trying not to get overwhelmed by what is going on with her siblings and the settling of her mothers estate. She is sleeping better the last several days and is doing research on her own. She is happy and a part of the happiness she is experiencing  is because she is active and working now. She works 7 to 2, 5 days a week and she is thankful for this helping her with her overall health and well being. Education provided. Will continue to monitor for changes or new needs.  Screening for signs and symptoms of depression related to chronic disease state  Assessed social determinant of health barriers   Symptom Management: Take medications as prescribed   Attend all scheduled provider appointments Call provider office for new concerns or questions  call the Suicide and Crisis Lifeline: 988 call the [place] National Suicide Prevention Lifeline: (681)496-1894 or TTY: 501-760-7289 TTY 781-243-4712) to talk to a trained counselor call 1-800-273-TALK (toll free, 24 hour hotline) if experiencing a Mental Health or Behavioral Health Crisis   Follow Up Plan: Telephone follow up appointment with care management team member scheduled for: 07-06-2023 at 0900 am       RNCM Care Management Maintain, Monitor and Self-Manage Symptoms of COPD       Current Barriers:  Knowledge Deficits related to the importance of smoking cessation in a patient with COPD, support and education for resources to help with management of COPD and other chronic conditions Care Coordination needs related to medication cost and community resources needed in a patient with COPD Chronic Disease Management support and education needs related to Effective management of COPD Current everyday smoker. She discussed wanting to stop smoking today (01-20-2023)  Planned Interventions: Provided patient with basic written and verbal COPD education on self care/management/and exacerbation prevention. The patient denies any changes in her breathing. She states that she does want to quit smoking and will talk to the pcp about this when she sees the pcp. She states that her granddaughter stayed with her over the weekend and she states that she was coughing and having periods in her sleep where she  wasn't breathing regularly and snoring. Discussed if the patient ever has had a sleep study test. The patient states she has not. She feels she can have a sleep apnea test now as she has money to do the testing. Review of discussing with the pcp about having testing for sleep apnea. Education provided.  Advised patient to track and manage COPD triggers. Review of weather changes and things to monitor for that would cause her to have issues with exacerbations.  Provided instruction about proper use of medications used for management of COPD including inhalers Advised patient to self assesses COPD action plan zone and make appointment with provider if in the yellow zone for 48 hours without improvement Advised patient to engage in light exercise as tolerated 3-5 days a week to aid in the the management of COPD Provided education about and advised patient to utilize infection prevention strategies to reduce risk of respiratory infection. Review and education given.  Discussed the importance of adequate rest and management of fatigue with COPD Referral made to community resources care guide team for assistance with resources in the community for food insecurities and financial constraints  Screening for signs and symptoms of depression related to chronic disease  state  Assessed social determinant of health barriers Discussed smoking cessation and discussing options with the pcp about smoking cessation. The patient states that she has been smoking since she was in the 10th grade of high school. Is ready to talk about smoking cessation. She wants to discuss with the pcp about nicotine patches for smoking cessation and a sleep study test.  The patient needs a sleep study. She has paperwork to fill out to help her with getting an in home sleep study. Education provided and will attach information to AVS on sleep apnea. Discussed ways to improve her sleep hygiene. Will continue to monitor for changes and needs.    Symptom Management: Take medications as prescribed   Attend all scheduled provider appointments Call provider office for new concerns or questions  call the Suicide and Crisis Lifeline: 988 call the [place] National Suicide Prevention Lifeline: 780-010-6221 or TTY: 847 080 7531 TTY (959)733-7670) to talk to a trained counselor call 1-800-273-TALK (toll free, 24 hour hotline) if experiencing a Mental Health or Behavioral Health Crisis  eliminate smoking in my home identify and remove indoor air pollutants limit outdoor activity during cold weather listen for public air quality announcements every day do breathing exercises every day develop a rescue plan eliminate symptom triggers at home follow rescue plan if symptoms flare-up  Follow Up Plan: Telephone follow up appointment with care management team member scheduled for: 07-06-2023 at 0900 am           Consent to Services:  Patient was given information about care management services, agreed to services, and gave verbal consent to participate.   Plan: Telephone follow up appointment with care management team member scheduled for: 07-06-2023 at 0900 am  Alto Denver RN, MSN, CCM RN Care Manager  [facility]  Ambulatory Care Management  Direct Number: [phone]

## 2023-05-04 NOTE — Patient Instructions (Signed)
Visit Information  Thank you for taking time to visit with me today. Please don't hesitate to contact me if I can be of assistance to you before our next scheduled telephone appointment.  Following are the goals we discussed today:   Goals Addressed             This Visit's Progress    RNCM Care Management Expected Outcome:  Monitor, Self-Manage and Reduce Symptoms [id number]       Current Barriers:  Knowledge Deficits related to resources in the community to help with expressed needs to effectively manage her health and wellbeing Care Coordination needs related to medication cost and mental health resources and support  in a patient with depression Chronic Disease Management support and education needs related to effective management of depression Loss of her husband in 2022, loss of her only son who was in the service in 2003 The patients mother diet on 01-06-23, concerns about the estate and the way her siblings are doing things.  Planned Interventions: Evaluation of current treatment plan related to depression  and patient's adherence to plan as established by provider. Her mother died 05-14-2024and this is added grief. The patient is doing much better than on previous outreaches. For about a month she has been working full time and this is helping her "financials". She feels like she is in a lot better place now. She works from 7 to 2 and she says this is really helping her. She does want to quit smoking and this is something she wants to talk to the pcp about. She thinks the patches will help her. She says she is "happy" and this means a lot to her.  Advised patient to call the office for changes in mood, anxiety, depression, or changes in her mental health and well being Provided education to patient re: [email or web address] for support of effective management of the grief process with the loss of her husband in 2022,  her son in 2003, and her mother on 2023-01-06. Review of the  [place] program and the benefits of connecting with others who have lost loved ones in death. She is going to look into this. [email or web address] has online and in person meetings. Review of how to go to the site and find groups in her area.  Reviewed medications with patient and discussed compliance. The patient states compliance with medications. Is receptive to working with the pharm D for help with medications cost constraints  Provided patient with mindfulness and stress reduction educational materials related to dealing effectively with anxiety and stress.  Reviewed scheduled/upcoming provider appointments including 07-14-2023 at 10 am. She missed her appointment with the [facility] recently due to  her care broken down. She has rescheduled this appointment. She states she will go back to see them soon.  Care Guide referral for resources in the community for financial constraints, utilities, and food resources. This has been completed. The patient has resources and is thankful and happy to report she has food in her freezer.  Social Work referral for help with questions about social support and if she would qualify for Medicaid. The patient works with the LCSW on a regular basis Pharmacy referral for help with cost constraints related to medication cost Discussed plans with patient for ongoing care management follow up and provided patient with direct contact information for care management team Advised patient to discuss changes in mood, anxiety, depression or other mentla health concerns with provider. The patient states that she is doing  better the last several days. She is trying not to get overwhelmed by what is going on with her siblings and the settling of her mothers estate. She is sleeping better the last several days and is doing research on her own. She is happy and a part of the happiness she is experiencing is because she is active and working now. She works 7 to 2, 5 days a week and she is thankful  for this helping her with her overall health and well being. Education provided. Will continue to monitor for changes or new needs.  Screening for signs and symptoms of depression related to chronic disease state  Assessed social determinant of health barriers   Symptom Management: Take medications as prescribed   Attend all scheduled provider appointments Call provider office for new concerns or questions  call the Suicide and Crisis Lifeline: 988 call the [place] National Suicide Prevention Lifeline: 445-212-3177 or TTY: (760)099-3240 TTY 936-851-2441) to talk to a trained counselor call 1-800-273-TALK (toll free, 24 hour hotline) if experiencing a Mental Health or Behavioral Health Crisis   Follow Up Plan: Telephone follow up appointment with care management team member scheduled for: 07-06-2023 at 0900 am       RNCM Care Management Maintain, Monitor and Self-Manage Symptoms of COPD       Current Barriers:  Knowledge Deficits related to the importance of smoking cessation in a patient with COPD, support and education for resources to help with management of COPD and other chronic conditions Care Coordination needs related to medication cost and community resources needed in a patient with COPD Chronic Disease Management support and education needs related to Effective management of COPD Current everyday smoker. She discussed wanting to stop smoking today (01-20-2023)  Planned Interventions: Provided patient with basic written and verbal COPD education on self care/management/and exacerbation prevention. The patient denies any changes in her breathing. She states that she does want to quit smoking and will talk to the pcp about this when she sees the pcp. She states that her granddaughter stayed with her over the weekend and she states that she was coughing and having periods in her sleep where she wasn't breathing regularly and snoring. Discussed if the patient ever has had a sleep study  test. The patient states she has not. She feels she can have a sleep apnea test now as she has money to do the testing. Review of discussing with the pcp about having testing for sleep apnea. Education provided.  Advised patient to track and manage COPD triggers. Review of weather changes and things to monitor for that would cause her to have issues with exacerbations.  Provided instruction about proper use of medications used for management of COPD including inhalers Advised patient to self assesses COPD action plan zone and make appointment with provider if in the yellow zone for 48 hours without improvement Advised patient to engage in light exercise as tolerated 3-5 days a week to aid in the the management of COPD Provided education about and advised patient to utilize infection prevention strategies to reduce risk of respiratory infection. Review and education given.  Discussed the importance of adequate rest and management of fatigue with COPD Referral made to community resources care guide team for assistance with resources in the community for food insecurities and financial constraints  Screening for signs and symptoms of depression related to chronic disease state  Assessed social determinant of health barriers Discussed smoking cessation and discussing options with the pcp about smoking cessation.  The patient states that she has been smoking since she was in the 10th grade of high school. Is ready to talk about smoking cessation. She wants to discuss with the pcp about nicotine patches for smoking cessation and a sleep study test.  The patient needs a sleep study. She has paperwork to fill out to help her with getting an in home sleep study. Education provided and will attach information to AVS on sleep apnea. Discussed ways to improve her sleep hygiene. Will continue to monitor for changes and needs.   Symptom Management: Take medications as prescribed   Attend all scheduled provider  appointments Call provider office for new concerns or questions  call the Suicide and Crisis Lifeline: 988 call the [place] National Suicide Prevention Lifeline: 779-019-1526 or TTY: (404) 019-7454 TTY (240)731-0489) to talk to a trained counselor call 1-800-273-TALK (toll free, 24 hour hotline) if experiencing a Mental Health or Behavioral Health Crisis  eliminate smoking in my home identify and remove indoor air pollutants limit outdoor activity during cold weather listen for public air quality announcements every day do breathing exercises every day develop a rescue plan eliminate symptom triggers at home follow rescue plan if symptoms flare-up  Follow Up Plan: Telephone follow up appointment with care management team member scheduled for: 07-06-2023 at 0900 am           Our next appointment is by telephone on 07-06-2023 at 0900 am  Please call the care guide team at [phone] if you need to cancel or reschedule your appointment.   If you are experiencing a Mental Health or Behavioral Health Crisis or need someone to talk to, please call the Suicide and Crisis Lifeline: 988 call the [place] National Suicide Prevention Lifeline: 201-616-0472 or TTY: 747-505-9028 TTY (250) 243-1089) to talk to a trained counselor call 1-800-273-TALK (toll free, 24 hour hotline)   Patient verbalizes understanding of instructions and care plan provided today and agrees to view in MyChart. Active MyChart status and patient understanding of how to access instructions and care plan via MyChart confirmed with patient.     Telephone follow up appointment with care management team member scheduled for: 07-06-2023 at 9 am  Alto Denver RN, MSN, CCM RN Care Manager  [facility]  Ambulatory Care Management  Direct Number: [phone]

## 2023-05-28 ENCOUNTER — Ambulatory Visit: Payer: Self-pay | Admitting: *Deleted

## 2023-05-28 NOTE — Telephone Encounter (Signed)
Multiple symptoms: dizziness, chest tightness- 3 weeks ago No energy since Reason for Disposition  [1] MODERATE weakness (i.e., interferes with work, school, normal activities) AND [2] persists > 3 days  Answer Assessment - Initial Assessment Questions 1. DESCRIPTION: "Describe how you are feeling."     Dragging all the time, aching 2. SEVERITY: "How bad is it?"  "Can you stand and walk?"   - MILD (0-3): Feels weak or tired, but does not interfere with work, school or normal activities.   - MODERATE (4-7): Able to stand and walk; weakness interferes with work, school, or normal activities.   - SEVERE (8-10): Unable to stand or walk; unable to do usual activities.     All over- moderate 3. ONSET: "When did these symptoms begin?" (e.g., hours, days, weeks, months)     3 week- after the chest tightness 4. CAUSE: "What do you think is causing the weakness or fatigue?" (e.g., not drinking enough fluids, medical problem, trouble sleeping)     Medical problems, trouble sleeping 5. NEW MEDICINES:  "Have you started on any new medicines recently?" (e.g., opioid pain medicines, benzodiazepines, muscle relaxants, antidepressants, antihistamines, neuroleptics, beta blockers)     no 6. OTHER SYMPTOMS: "Do you have any other symptoms?" (e.g., chest pain, fever, cough, SOB, vomiting, diarrhea, bleeding, other areas of pain)     no  Protocols used: Weakness (Generalized) and Fatigue-A-AH

## 2023-05-28 NOTE — Telephone Encounter (Signed)
  Chief Complaint: fatigue Symptoms: patient states 3 weeks ago she has chest tightness, dizziness, fatigue that finally passed- but the fatigue is still present, patient states she was under a lot of stress and chain smoking at that time Frequency: 3 weeks ago-1 episode and no pain in chest since then Pertinent Negatives: Patient denies any other symptoms now Disposition: [] ED /[] Urgent Care (no appt availability in office) / [x] Appointment(In office/virtual)/ []  [facility]/ [] Home Care/ [] Refused Recommended Disposition /[] [facility]/ []  Follow-up with PCP Additional Notes: Patient has been scheduled for appointment to evaluate her symptoms- she has been strongly encouraged to call 911 if she ever has anything like that happen again and she states she will not hesitate.

## 2023-05-29 ENCOUNTER — Encounter: Payer: Self-pay | Admitting: Nurse Practitioner

## 2023-05-29 ENCOUNTER — Ambulatory Visit (INDEPENDENT_AMBULATORY_CARE_PROVIDER_SITE_OTHER): Payer: Medicare HMO | Admitting: Nurse Practitioner

## 2023-05-29 VITALS — BP 93/62 | HR 66 | Temp 98.4°F | Resp 18 | Ht 64.0 in | Wt 183.0 lb

## 2023-05-29 DIAGNOSIS — F17210 Nicotine dependence, cigarettes, uncomplicated: Secondary | ICD-10-CM

## 2023-05-29 DIAGNOSIS — R42 Dizziness and giddiness: Secondary | ICD-10-CM

## 2023-05-29 DIAGNOSIS — Z6831 Body mass index (BMI) 31.0-31.9, adult: Secondary | ICD-10-CM | POA: Diagnosis not present

## 2023-05-29 DIAGNOSIS — E6609 Other obesity due to excess calories: Secondary | ICD-10-CM | POA: Diagnosis not present

## 2023-05-29 DIAGNOSIS — R5383 Other fatigue: Secondary | ICD-10-CM | POA: Insufficient documentation

## 2023-05-29 DIAGNOSIS — E66811 Obesity, class 1: Secondary | ICD-10-CM | POA: Diagnosis not present

## 2023-05-29 MED ORDER — NICOTINE 7 MG/24HR TD PT24: 7.0000 mg | MEDICATED_PATCH | Freq: Every day | TRANSDERMAL | 0 refills | Status: AC

## 2023-05-29 MED ORDER — NICOTINE 14 MG/24HR TD PT24: 14.0000 mg | MEDICATED_PATCH | Freq: Every day | TRANSDERMAL | 0 refills | Status: AC

## 2023-05-29 NOTE — Assessment & Plan Note (Signed)
Since August and starting new job during daytime hours.  Suspect much of fatigue is related to sleep changes, as she is a third shift person at baseline.  Also has many stressors at this time, will reach out to case manager on this.  May benefit sleep study in future.  Labs today: A1c, CMP, CBC, TSH.

## 2023-05-29 NOTE — Assessment & Plan Note (Signed)
I have recommended complete cessation of tobacco use. I have discussed various options available for assistance with tobacco cessation including over the counter methods (Nicotine gum, patch and lozenges). We also discussed prescription options (Chantix, Nicotine Inhaler / Nasal Spray). The patient is not interested in pursuing any prescription tobacco cessation options at this time.  Continue annual lung screening. 

## 2023-05-29 NOTE — Assessment & Plan Note (Signed)
BMI 31.41.  Recommended eating smaller high protein, low fat meals more frequently and exercising 30 mins a day 5 times a week with a goal of 10-15lb weight loss in the next 3 months. Patient voiced their understanding and motivation to adhere to these recommendations.

## 2023-05-29 NOTE — Progress Notes (Signed)
BP 93/62 (BP Location: Left Arm, Patient Position: Sitting)   Pulse 66   Temp 98.4 F (36.9 C) (Oral)   Resp 18   Ht 5\' 4"  (1.626 m)   Wt 183 lb (83 kg)   SpO2 98%   BMI 31.41 kg/m    Subjective:    Patient ID: Heather Hill, female    DOB: 09/12/1955, 67 y.o.   MRN: [id number]  HPI: Heather Hill is a 67 y.o. female  Chief Complaint  Patient presents with   Fatigue    Pt stated--feeling tired, sore leg/thigh, dizziness--1 month   FATIGUE Started with fatigue in August.  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  Is a third shift person.  Can not sleep, appetite varies due to schedule.  Has had some dizziness recently, if gets up too quickly will notice this.  Missed recent psychiatry visit 04/20/23, is rescheduled for virtual. Duration:  weeks Severity: 9/10  Onset: sudden Context when symptoms started:  started new first shift job Symptoms improve with rest: no   Depressive symptoms: yes is struggling with needs to get gas, paying light bill, and husband's truck is in ship will be $1700 Stress/anxiety: yes Insomnia: yes hard to fall asleep -- coughs at times while lying down Snoring: yes per her granddaughter -- went to pulmonary on 01/28/23, but can not afford sleep study Observed apnea by bed partner: no Daytime hypersomnolence:yes Wakes feeling refreshed: no History of sleep study: no Dysnea on exertion:  no Orthopnea/PND: no Chest pain: no Chronic cough: no Lower extremity edema: no -- is on feet at job for 2-3 hours, does a lot bending and both legs are sore constantly Arthralgias:yes Myalgias: no Weakness: sometimes Rash: no     05/29/2023    9:22 AM 05/04/2023    9:17 AM 03/03/2023    2:46 PM 02/03/2023    8:29 AM 01/13/2023   11:12 AM  Depression screen PHQ 2/9  Decreased Interest 1 0 2 2 2   Down, Depressed, Hopeless 2 0 1 1 2   PHQ - 2 Score 3 0 3 3 4   Altered sleeping 3  2 2 3   Tired, decreased energy 3  2 2 3    Change in appetite 0  2 2 2   Feeling bad or failure about yourself  0  2 0 2  Trouble concentrating 2  1 0 1  Moving slowly or fidgety/restless 0  2 0 0  Suicidal thoughts 0  0 0 0  PHQ-9 Score 11  14 9 15   Difficult doing work/chores Not difficult at all  Somewhat difficult Not difficult at all Somewhat difficult       05/29/2023    9:22 AM 03/03/2023    2:46 PM 02/03/2023    8:30 AM 01/13/2023   11:12 AM  GAD 7 : Generalized Anxiety Score  Nervous, Anxious, on Edge 0 2 0 2  Control/stop worrying 0 2 2 2   Worry too much - different things 0 2 2 2   Trouble relaxing 3 2 2 3   Restless 2 1 0 2  Easily annoyed or irritable 0 2 0 2  Afraid - awful might happen 1 1 0 0  Total GAD 7 Score 6 12 6 13   Anxiety Difficulty Not difficult at all Somewhat difficult Not difficult at all Very difficult    DIZZINESS Started around June with episodes of dizziness.  States dizziness is worse with bending down and  standing up too quickly.  She is a smoker, currently 2 cigarettes a day.  Was a 1/2 PPD smoker in past and would like to stop.  Would like patches.  Smoker since high school. Duration: months Description of symptoms: ill-defined Duration of episode:  sometimes all day Dizziness frequency: recurrent Provoking factors:  bending over and getting up too fast Aggravating factors:  as above Triggered by rolling over in bed: no Triggered by bending over: yes Aggravated by head movement: yes Aggravated by exertion, coughing, loud noises: no Recent head injury: no Recent or current viral symptoms: no History of vasovagal episodes: no Nausea: yes -- at times Vomiting: no Tinnitus: no Hearing loss: no Aural fullness: no Headache: x 1 yesterday and then Tuesday Photophobia/phonophobia: no Unsteady gait: feels wobbly a little bit Postural instability: no Diplopia, dysarthria, dysphagia or weakness: no Related to exertion: no Pallor: no Diaphoresis: no Dyspnea: no Chest pain: no    Relevant past medical, surgical, family and social history reviewed and updated as indicated. Interim medical history since our last visit reviewed. Allergies and medications reviewed and updated.  Review of Systems  Constitutional:  Positive for fatigue. Negative for activity change, appetite change, diaphoresis and fever.  Respiratory:  Negative for cough, chest tightness, shortness of breath and wheezing.   Cardiovascular:  Negative for chest pain, palpitations and leg swelling.  Gastrointestinal:  Positive for nausea. Negative for abdominal pain, diarrhea and vomiting.  Neurological:  Positive for dizziness and headaches (x 2 recently, but not with dizziness). Negative for syncope, weakness, light-headedness and numbness.  Psychiatric/Behavioral: Negative.      Per HPI unless specifically indicated above     Objective:    BP 93/62 (BP Location: Left Arm, Patient Position: Sitting)   Pulse 66   Temp 98.4 F (36.9 C) (Oral)   Resp 18   Ht 5\' 4"  (1.626 m)   Wt 183 lb (83 kg)   SpO2 98%   BMI 31.41 kg/m   Wt Readings from Last 3 Encounters:  05/29/23 183 lb (83 kg)  03/03/23 188 lb 3.2 oz (85.4 kg)  02/03/23 193 lb (87.5 kg)    Physical Exam Vitals and nursing note reviewed.  Constitutional:      General: She is awake. She is not in acute distress.    Appearance: She is well-developed and well-groomed. She is obese. She is not ill-appearing or toxic-appearing.  HENT:     Head: Normocephalic.     Right Ear: Hearing and external ear normal.     Left Ear: Hearing and external ear normal.  Eyes:     General: Lids are normal.        Right eye: No discharge.        Left eye: No discharge.     Conjunctiva/sclera: Conjunctivae normal.     Pupils: Pupils are equal, round, and reactive to light.  Neck:     Thyroid: No thyromegaly.     Vascular: No carotid bruit.  Cardiovascular:     Rate and Rhythm: Normal rate and regular rhythm.     Heart sounds: Normal heart sounds.  No murmur heard.    No gallop.  Pulmonary:     Effort: Pulmonary effort is normal. No accessory muscle usage or respiratory distress.     Breath sounds: Normal breath sounds.  Abdominal:     General: Bowel sounds are normal. There is no distension.     Palpations: Abdomen is soft.     Tenderness: There is no  abdominal tenderness.  Musculoskeletal:     Cervical back: Normal range of motion and neck supple.     Right lower leg: No edema.     Left lower leg: No edema.  Lymphadenopathy:     Cervical: No cervical adenopathy.  Skin:    General: Skin is warm and dry.  Neurological:     Mental Status: She is alert and oriented to person, place, and time.     Cranial Nerves: Cranial nerves 2-12 are intact.     Motor: Motor function is intact.     Coordination: Coordination is intact. Romberg sign negative. Finger-Nose-Finger Test and Heel to [place] Test normal.     Gait: Gait is intact.     Deep Tendon Reflexes: Reflexes are normal and symmetric.     Reflex Scores:      Brachioradialis reflexes are 2+ on the right side and 2+ on the left side.      Patellar reflexes are 2+ on the right side and 2+ on the left side.    Comments: Unable to reproduce dizziness in office today.  Psychiatric:        Attention and Perception: Attention normal.        Mood and Affect: Mood normal.        Speech: Speech normal.        Behavior: Behavior normal. Behavior is cooperative.        Thought Content: Thought content normal.     Results for orders placed or performed in visit on 12/31/22  Comprehensive metabolic panel  Result Value Ref Range   Glucose 106 (H) 70 - 99 mg/dL   BUN 14 8 - 27 mg/dL   Creatinine, Ser 4.09 0.57 - 1.00 mg/dL   eGFR 77 >81 XB/JYN/8.29   BUN/Creatinine Ratio 17 12 - 28   Sodium 142 134 - 144 mmol/L   Potassium 4.1 3.5 - 5.2 mmol/L   Chloride 102 96 - 106 mmol/L   CO2 25 20 - 29 mmol/L   Calcium 9.2 8.7 - 10.3 mg/dL   Total Protein 6.9 6.0 - 8.5 g/dL   Albumin 4.2 3.9 -  4.9 g/dL   Globulin, Total 2.7 1.5 - 4.5 g/dL   Albumin/Globulin Ratio 1.6 1.2 - 2.2   Bilirubin Total 0.3 0.0 - 1.2 mg/dL   Alkaline Phosphatase 108 44 - 121 IU/L   AST 13 0 - 40 IU/L   ALT 20 0 - 32 IU/L  Lipid Panel w/o Chol/HDL Ratio  Result Value Ref Range   Cholesterol, Total 118 100 - 199 mg/dL   Triglycerides 75 0 - 149 mg/dL   HDL 49 >56 mg/dL   VLDL Cholesterol Cal 15 5 - 40 mg/dL   LDL Chol Calc (NIH) 54 0 - 99 mg/dL  HgB O1H  Result Value Ref Range   Hgb A1c MFr Bld 6.1 (H) 4.8 - 5.6 %   Est. average glucose Bld gHb Est-mCnc 128 mg/dL  VITAMIN D 25 Hydroxy (Vit-D Deficiency, Fractures)  Result Value Ref Range   Vit D, 25-Hydroxy 54.8 30.0 - 100.0 ng/mL      Assessment & Plan:   Problem List Items Addressed This Visit       Other   Dizziness    Recurrent issues, has Meclizine to take as needed.  Neuro exam reassuring.  BP does run on lower side, suspect much of her dizziness is from orthostatic changes with position changes.  Have recommended she wear compression hose, on during day and  off at night, especially while at work.  Could consider trial of low dose Midodrine in future if ongoing symptoms.  Labs today.      Relevant Orders   HgB A1c   TSH   CBC with Differential/Platelet   Comprehensive metabolic panel   Fatigue - Primary    Since August and starting new job during daytime hours.  Suspect much of fatigue is related to sleep changes, as she is a third shift person at baseline.  Also has many stressors at this time, will reach out to case manager on this.  May benefit sleep study in future.  Labs today: A1c, CMP, CBC, TSH.      Relevant Orders   HgB A1c   TSH   CBC with Differential/Platelet   Comprehensive metabolic panel   Nicotine dependence, cigarettes, uncomplicated    I have recommended complete cessation of tobacco use. I have discussed various options available for assistance with tobacco cessation including over the counter methods (Nicotine  gum, patch and lozenges). We also discussed prescription options (Chantix, Nicotine Inhaler / Nasal Spray). The patient is not interested in pursuing any prescription tobacco cessation options at this time.  Continue annual lung screening.       Relevant Medications   nicotine (NICODERM CQ - DOSED IN MG/24 HOURS) 14 mg/24hr patch   nicotine (NICODERM CQ - DOSED IN MG/24 HR) 7 mg/24hr patch (Start on 07/09/2023)   Obesity    BMI 31.41.  Recommended eating smaller high protein, low fat meals more frequently and exercising 30 mins a day 5 times a week with a goal of 10-15lb weight loss in the next 3 months. Patient voiced their understanding and motivation to adhere to these recommendations.         Follow up plan: Return for as scheduled November 19th.

## 2023-05-29 NOTE — Patient Instructions (Signed)
Dizziness Dizziness is a common problem. It makes you feel unsteady or light-headed. You may feel like you are about to pass out (faint). Dizziness can lead to getting hurt if you stumble or fall. Dizziness can be caused by many things, including: Medicines. Not having enough water in your body (dehydration). Illness. Follow these instructions at home: Eating and drinking  Drink enough fluid to keep your pee (urine) pale yellow. This helps to keep you from getting dehydrated. Try to drink more clear fluids, such as water. Do not drink alcohol. Limit how much caffeine you drink or eat, if your doctor tells you to do that. Limit how much salt (sodium) you drink or eat, if your doctor tells you to do that. Activity  Avoid making quick movements. Stand up slowly from sitting in a chair, and steady yourself until you feel okay. In the morning, first sit up on the side of the bed. When you feel okay, stand up slowly while you hold onto something. Do this until you know that your balance is okay. If you need to stand in one place for a long time, move your legs often. Tighten and relax the muscles in your legs while you are standing. Do not drive or use machinery if you feel dizzy. Avoid bending down if you feel dizzy. Place items in your home so you can reach them easily without leaning over. Lifestyle Do not smoke or use any products that contain nicotine or tobacco. If you need help quitting, ask your doctor. Try to lower your stress level. You can do this by using methods such as yoga or meditation. Talk with your doctor if you need help. General instructions Watch your dizziness for any changes. Take over-the-counter and prescription medicines only as told by your doctor. Talk with your doctor if you think that you are dizzy because of a medicine that you are taking. Tell a friend or a family member that you are feeling dizzy. If he or she notices any changes in your behavior, have this  person call your doctor. Keep all follow-up visits. Contact a doctor if: Your dizziness does not go away. Your dizziness or light-headedness gets worse. You feel like you may vomit (are nauseous). You have trouble hearing. You have new symptoms. You are unsteady on your feet. You feel like the room is spinning. You have neck pain or a stiff neck. You have a fever. Get help right away if: You vomit or have watery poop (diarrhea), and you cannot eat or drink anything. You have trouble: Talking. Walking. Swallowing. Using your arms, hands, or legs. You feel generally weak. You are not thinking clearly, or you have trouble forming sentences. A friend or family member may notice this. You have: Chest pain. Pain in your belly (abdomen). Shortness of breath. Sweating. Your vision changes. You are bleeding. You have a very bad headache. These symptoms may be an emergency. Get help right away. Call your local emergency services (911 in the [place] Do not wait to see if the symptoms will go away. Do not drive yourself to the hospital. Summary Dizziness makes you feel unsteady or light-headed. You may feel like you are about to pass out (faint). Drink enough fluid to keep your pee (urine) pale yellow. Do not drink alcohol. Avoid making quick movements if you feel dizzy. Watch your dizziness for any changes. This information is not intended to replace advice given to you by your health care provider. Make sure you discuss any questions   you have with your health care provider. Document Revised: 07/13/2020 Document Reviewed: 07/16/2020 Elsevier Patient Education  2024 [employer]  

## 2023-05-29 NOTE — Assessment & Plan Note (Addendum)
Recurrent issues, has Meclizine to take as needed.  Neuro exam reassuring.  BP does run on lower side, suspect much of her dizziness is from orthostatic changes with position changes.  Have recommended she wear compression hose, on during day and off at night, especially while at work.  Could consider trial of low dose Midodrine in future if ongoing symptoms.  Labs today.

## 2023-05-30 LAB — CBC WITH DIFFERENTIAL/PLATELET
Basophils Absolute: 0 10*3/uL (ref 0.0–0.2)
Basos: 1 %
EOS (ABSOLUTE): 0.1 10*3/uL (ref 0.0–0.4)
Eos: 4 %
Hematocrit: 42.8 % (ref 34.0–46.6)
Hemoglobin: 13.3 g/dL (ref 11.1–15.9)
Immature Grans (Abs): 0 10*3/uL (ref 0.0–0.1)
Immature Granulocytes: 0 %
Lymphocytes Absolute: 1.1 10*3/uL (ref 0.7–3.1)
Lymphs: 32 %
MCH: 26.3 pg — ABNORMAL LOW (ref 26.6–33.0)
MCHC: 31.1 g/dL — ABNORMAL LOW (ref 31.5–35.7)
MCV: 85 fL (ref 79–97)
Monocytes Absolute: 0.6 10*3/uL (ref 0.1–0.9)
Monocytes: 18 %
Neutrophils Absolute: 1.6 10*3/uL (ref 1.4–7.0)
Neutrophils: 45 %
Platelets: 240 10*3/uL (ref 150–450)
RBC: 5.05 x10E6/uL (ref 3.77–5.28)
RDW: 13.9 % (ref 11.7–15.4)
WBC: 3.5 10*3/uL (ref 3.4–10.8)

## 2023-05-30 LAB — HEMOGLOBIN A1C
Est. average glucose Bld gHb Est-mCnc: 131 mg/dL
Hgb A1c MFr Bld: 6.2 % — ABNORMAL HIGH (ref 4.8–5.6)

## 2023-05-30 LAB — COMPREHENSIVE METABOLIC PANEL
ALT: 15 [IU]/L (ref 0–32)
AST: 19 [IU]/L (ref 0–40)
Albumin: 4.1 g/dL (ref 3.9–4.9)
Alkaline Phosphatase: 103 [IU]/L (ref 44–121)
BUN/Creatinine Ratio: 12 (ref 12–28)
BUN: 10 mg/dL (ref 8–27)
Bilirubin Total: 0.2 mg/dL (ref 0.0–1.2)
CO2: 24 mmol/L (ref 20–29)
Calcium: 9.1 mg/dL (ref 8.7–10.3)
Chloride: 104 mmol/L (ref 96–106)
Creatinine, Ser: 0.82 mg/dL (ref 0.57–1.00)
Globulin, Total: 2.9 g/dL (ref 1.5–4.5)
Glucose: 113 mg/dL — ABNORMAL HIGH (ref 70–99)
Potassium: 4.2 mmol/L (ref 3.5–5.2)
Sodium: 143 mmol/L (ref 134–144)
Total Protein: 7 g/dL (ref 6.0–8.5)
eGFR: 78 mL/min/{1.73_m2} (ref >59)

## 2023-05-30 LAB — TSH: TSH: 1.23 u[IU]/mL (ref 0.450–4.500)

## 2023-05-30 NOTE — Progress Notes (Signed)
Contacted via MyChart   Good morning Heather Hill, your labs have returned: - Your A1c is trending up more, but not at diabetes range yet.  Still in prediabetic range.  You were 6.1% and now 6.2%.  I am concerned you will enter diabetes range.  This can cause some fatigue.  Please work on diet changes with less sugar and carbohydrate intake (sweets, white bread, white potatoes, pasta). - The remainder of labs are stable with no finding to explain fatigue and dizziness.  As I recommended wear compression hose, if BP remains low at baseline and you continue to have issues with orthostatic changes I may start you on a medication to help prevent this.  Any questions? Keep being amazing!!  Thank you for allowing me to participate in your care.  I appreciate you. Kindest regards, Caressa Scearce
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
## 2023-06-18 ENCOUNTER — Encounter: Payer: Self-pay | Admitting: Psychiatry

## 2023-06-18 ENCOUNTER — Telehealth: Payer: Medicare HMO | Admitting: Psychiatry

## 2023-06-18 DIAGNOSIS — F330 Major depressive disorder, recurrent, mild: Secondary | ICD-10-CM

## 2023-06-18 DIAGNOSIS — G4700 Insomnia, unspecified: Secondary | ICD-10-CM | POA: Diagnosis not present

## 2023-06-18 MED ORDER — SERTRALINE HCL 100 MG PO TABS: 100.0000 mg | ORAL_TABLET | Freq: Every day | ORAL | 1 refills | Status: DC

## 2023-06-18 MED ORDER — TRAZODONE HCL 150 MG PO TABS: 150.0000 mg | ORAL_TABLET | Freq: Every evening | ORAL | 3 refills | Status: DC | PRN

## 2023-06-18 NOTE — Patient Instructions (Addendum)
Continue sertraline 100 mg at night  Continue trazodone 150 mg at night as needed for insomnia Referred to therapy  Next appointment: 12/4 at 4 30

## 2023-06-22 ENCOUNTER — Other Ambulatory Visit: Payer: Self-pay | Admitting: Psychiatry

## 2023-06-22 ENCOUNTER — Other Ambulatory Visit: Payer: Self-pay | Admitting: Nurse Practitioner

## 2023-06-23 NOTE — Telephone Encounter (Signed)
Requested Prescriptions  Pending Prescriptions Disp Refills   atorvastatin (LIPITOR) 10 MG tablet [Pharmacy Med Name: ATORVASTATIN 10MG  TABLETS] 90 tablet 1    Sig: TAKE 1 TABLET(10 MG) BY MOUTH DAILY     Cardiovascular:  Antilipid - Statins Failed - 06/22/2023  7:07 AM      Failed - Lipid Panel in normal range within the last 12 months    Cholesterol, Total  Date Value Ref Range Status  12/31/2022 118 100 - 199 mg/dL Final   Cholesterol Piccolo, Waived  Date Value Ref Range Status  02/28/2019 155 <200 mg/dL Final    Comment:                            Desirable                <200                         Borderline High      200- 239                         High                     >239    LDL Chol Calc (NIH)  Date Value Ref Range Status  12/31/2022 54 0 - 99 mg/dL Final   HDL  Date Value Ref Range Status  12/31/2022 49 >39 mg/dL Final   Triglycerides  Date Value Ref Range Status  12/31/2022 75 0 - 149 mg/dL Final   Triglycerides Piccolo,Waived  Date Value Ref Range Status  02/28/2019 80 <150 mg/dL Final    Comment:                            Normal                   <150                         Borderline High     150 - 199                         High                200 - 499                         Very High                >499          Passed - Patient is not pregnant      Passed - Valid encounter within last 12 months    Recent Outpatient Visits           3 weeks ago Fatigue, unspecified type   [facility] [place], Corrie Dandy T, NP   4 months ago Left facial swelling   [facility] [place], Sherran Needs, NP   5 months ago Centrilobular emphysema (HCC)   [facility] [place], [place] T, NP   6 months ago Moderate episode of recurrent major depressive disorder [facility])   [facility] [place], [place] T, NP   11 months ago Centrilobular emphysema (HCC)  [facility] [place], Dorie Rank, NP       Future Appointments             In 3 weeks Cannady, Dorie Rank, NP [facility]

## 2023-07-06 ENCOUNTER — Telehealth: Payer: Self-pay | Admitting: *Deleted

## 2023-07-06 ENCOUNTER — Other Ambulatory Visit: Payer: Self-pay

## 2023-07-06 ENCOUNTER — Other Ambulatory Visit: Payer: Self-pay | Admitting: *Deleted

## 2023-07-06 ENCOUNTER — Encounter: Payer: Self-pay | Admitting: *Deleted

## 2023-07-06 DIAGNOSIS — F331 Major depressive disorder, recurrent, moderate: Secondary | ICD-10-CM
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
## 2023-07-06 NOTE — Patient Instructions (Signed)
Visit Information  Thank you for taking time to visit with me today. Please don't hesitate to contact me if I can be of assistance to you before our next scheduled telephone appointment.  Following are the goals we discussed today:   Goals Addressed             This Visit's Progress    RNCM Care Management Expected Outcome:  Monitor, Self-Manage and Reduce Symptoms [id number]       Current Barriers:  Knowledge Deficits related to resources in the community to help with expressed needs to effectively manage her health and wellbeing Care Coordination needs related to medication cost and mental health resources and support  in a patient with depression Chronic Disease Management support and education needs related to effective management of depression Loss of her husband in 07-15-2021, loss of her only son who was in the service in Jul 15, 2002 The patients mother died on 12-19-2022, concerns about the estate and the way her siblings are doing things.  Planned Interventions: Evaluation of current treatment plan related to depression  and patient's adherence to plan as established by provider. The patient states that she is doing very good and not having any depression at this time. She states she is frustrated with a recent break in and states since the passing of her husband this has been an issue. She states she is working now but she expresses concerns related to her utility/gas bill, RNCM placed referral for care guides.  She also stated that she has a pipe under her house that burst and will be repaired today.  Advised patient to call the office for changes in mood, anxiety, depression, or changes in her mental health and well being Provided education to patient re: [email or web address] for support of effective management of the grief process with the loss of her husband in 07-15-21,  her son in 07/15/02, and her mother on 12-19-2022. Review of the [place] program and the benefits of connecting with others who have  lost loved ones in death. She is going to look into this. [email or web address] has online and in person meetings. Review of how to go to the site and find groups in her area.  Reviewed medications with patient and discussed compliance. The patient states compliance with medications. Is receptive to working with the pharm D for help with medications cost constraints  Provided patient with mindfulness and stress reduction educational materials related to dealing effectively with anxiety and stress.  Reviewed scheduled/upcoming provider appointments including 07-14-2023 at 10 am.  Care Guide referral for resources in the community for financial constraints, utilities, and food resources. This has been completed. The patient has resources and is thankful and happy to report she has food in her freezer.  Social Work referral for help with questions about social support and if she would qualify for Medicaid. The patient works with the LCSW on a regular basis Pharmacy referral for help with cost constraints related to medication cost Discussed plans with patient for ongoing care management follow up and provided patient with direct contact information for care management team Advised patient to discuss changes in mood, anxiety, depression or other mentla health concerns with provider.  Screening for signs and symptoms of depression related to chronic disease state  Assessed social determinant of health barriers   Symptom Management: Take medications as prescribed   Attend all scheduled provider appointments Call provider office for new concerns or questions  call the Suicide and Crisis Lifeline: 988 call the [place] National  Suicide Prevention Lifeline: 417-655-7088 or TTY: 815 433 3893 TTY (878)810-3483) to talk to a trained counselor call 1-800-273-TALK (toll free, 24 hour hotline) if experiencing a Mental Health or Behavioral Health Crisis   Follow Up Plan: Telephone follow up appointment with care  management team member scheduled for: 09-07-2023 at 0900 am       RNCM Care Management Maintain, Monitor and Self-Manage Symptoms of COPD       Current Barriers:  Knowledge Deficits related to the importance of smoking cessation in a patient with COPD, support and education for resources to help with management of COPD and other chronic conditions Care Coordination needs related to medication cost and community resources needed in a patient with COPD Chronic Disease Management support and education needs related to Effective management of COPD Former smoker  Planned Interventions: Provided patient with basic written and verbal COPD education on self care/management/and exacerbation prevention. The states her breathing is much better since she stopped smoking. She states she quit in September and that the patches are working great and she has not had the urge to reach for a cigarette. Advised patient to track and manage COPD triggers. Review of weather changes and things to monitor for that would cause her to have issues with exacerbations.  Provided instruction about proper use of medications used for management of COPD including inhalers Advised patient to self assesses COPD action plan zone and make appointment with provider if in the yellow zone for 48 hours without improvement Advised patient to engage in light exercise as tolerated 3-5 days a week to aid in the the management of COPD Provided education about and advised patient to utilize infection prevention strategies to reduce risk of respiratory infection. Review and education given.  Discussed the importance of adequate rest and management of fatigue with COPD Referral made to community resources care guide team for assistance with resources in the community for food insecurities and financial constraints  Screening for signs and symptoms of depression related to chronic disease state  Assessed social determinant of health  barriers   Symptom Management: Take medications as prescribed   Attend all scheduled provider appointments Call provider office for new concerns or questions  call the Suicide and Crisis Lifeline: 988 call the [place] National Suicide Prevention Lifeline: (704)074-4763 or TTY: (917) 702-2241 TTY (971)448-6342) to talk to a trained counselor call 1-800-273-TALK (toll free, 24 hour hotline) if experiencing a Mental Health or Behavioral Health Crisis  eliminate smoking in my home identify and remove indoor air pollutants limit outdoor activity during cold weather listen for public air quality announcements every day do breathing exercises every day develop a rescue plan eliminate symptom triggers at home follow rescue plan if symptoms flare-up  Follow Up Plan: Telephone follow up appointment with care management team member scheduled for: 09-07-2023 at 0900 am           Our next appointment is by telephone on 09-07-2023 at 9:00 am  Please call the care guide team at [phone] if you need to cancel or reschedule your appointment.   If you are experiencing a Mental Health or Behavioral Health Crisis or need someone to talk to, please call the Suicide and Crisis Lifeline: 988 call the [place] National Suicide Prevention Lifeline: 6397297016 or TTY: 715-841-6512 TTY 531-493-9725) to talk to a trained counselor call 1-800-273-TALK (toll free, 24 hour hotline) call 911   Patient verbalizes understanding of instructions and care plan provided today and agrees to view in MyChart. Active MyChart status and patient  understanding of how to access instructions and care plan via MyChart confirmed with patient.     Telephone follow up appointment with care management team member scheduled for: 09-07-2023 at 9:00 am  Danise Edge, BSN RN RN Care Manager  [facility]  Ambulatory Care Management  Direct Number: [phone]

## 2023-07-06 NOTE — Progress Notes (Unsigned)
  Care Coordination  Outreach Note  07/06/2023 Name: JULLIE DESHOTELS MRN: [id number] DOB: [birth year]   Care Coordination Outreach Attempts: An unsuccessful telephone outreach was attempted today to offer the patient information about available care coordination services.  Follow Up Plan:  Additional outreach attempts will be made to offer the patient care coordination information and services.   Encounter Outcome:  No Answer  Burman Nieves, CCMA Care Coordination Care Guide Direct Dial: [phone]

## 2023-07-08 NOTE — Progress Notes (Signed)
  Care Coordination   Note   07/08/2023 Name: GENAVIVE MELIKIAN MRN: [id number] DOB: Jul 30, 1956  [name] is a 67 y.o. year old female who sees [place], Corrie Dandy T, NP for primary care. I reached out to [employer] by phone today to offer care coordination services.  Ms. Etris was given information about Care Coordination services today including:   The Care Coordination services include support from the care team which includes your Nurse Coordinator, Clinical Social Worker, or Pharmacist.  The Care Coordination team is here to help remove barriers to the health concerns and goals most important to you. Care Coordination services are voluntary, and the patient may decline or stop services at any time by request to their care team member.   Care Coordination Consent Status: Patient agreed to services and verbal consent obtained.   Follow up plan:  Telephone appointment with care coordination team member scheduled for:  07/15/2023  Encounter Outcome:  Patient Scheduled from referral   Burman Nieves, [facility] Care Coordination Care Guide Direct Dial: [phone]

## 2023-07-12 NOTE — Patient Instructions (Incomplete)
Eating Plan for Chronic Obstructive Pulmonary Disease Chronic obstructive pulmonary disease (COPD) causes symptoms such as shortness of breath, coughing, and chest discomfort. These symptoms can make it difficult to eat enough to maintain a healthy weight. Generally, people with COPD should eat a diet that is high in calories, protein, and other nutrients to maintain body weight and to keep the lungs as healthy as possible. Depending on the medicines you take and other health conditions you may have, your health care provider may give you additional recommendations on what to eat or avoid. Talk with your health care provider about your goals for body weight, and work with a dietitian to develop an eating plan that is right for you. What are tips for following this plan? Reading food labels  Avoid foods with more than 300 milligrams (mg) of salt (sodium) per serving. Choose foods that contain at least 4 grams (g) of fiber per serving. Try to eat 20-30 g of fiber each day. Choose foods that are high in calories and protein, such as nuts, beans, yogurt, and cheese. Shopping Do not buy foods labeled as diet, low-calorie, or low-fat. If you are able to eat dairy products: Avoid low-fat or skim milk. Buy dairy products that have at least 2% fat. Buy nutritional supplement drinks. Buy grains and prepared foods labeled as enriched or fortified. Consider buying low-sodium, pre-made foods to conserve energy for eating. Cooking Add dry milk or protein powder to smoothies. Cook with healthy fats, such as olive oil, canola oil, sunflower oil, and grapeseed oil. Add oil, butter, cream cheese, or nut butters to foods to increase fat and calories. To make foods easier to chew and swallow: Cook vegetables, pasta, and rice until soft. Cut or grind meat into very small pieces. Dip breads in liquid. Meal planning  Eat when you feel hungry. Eat 5-6 small meals throughout the day. Drink 6-8 glasses of water  each day. Do not drink liquids with meals. Drink liquids at the end of the meal to avoid feeling full too quickly. Eat a variety of fruits and vegetables every day. Ask for assistance from family or friends with planning and preparing meals as needed. Avoid foods that cause you to feel bloated, such as carbonated drinks, fried foods, beans, broccoli, cabbage, and apples. For older adults, ask your local agency on aging whether you are eligible for meal assistance programs, such as Meals on Wheels. Lifestyle  Do not smoke. Eat slowly. Take small bites and chew food well before swallowing. Do not overeat. This may make it more difficult to breathe after eating. Sit up while eating. If needed, continue to use supplemental oxygen while eating. Rest or relax for 30 minutes before and after eating. Monitor your weight as told by your health care provider. Exercise as told by your health care provider. What foods should I eat? Fruits All fresh, dried, canned, or frozen fruits that do not cause gas. Vegetables All fresh, canned (no salt added), or frozen vegetables that do not cause gas. Grains Whole-grain bread. Enriched whole-grain pasta. Fortified whole-grain cereals. Fortified rice. Quinoa. Meats and other proteins Lean meat. Poultry. Fish. Dried beans. Unsalted nuts. Tofu. Eggs. Nut butters. Dairy Whole or 2% milk. Cheese. Yogurt. Fats and oils Olive oil. Canola oil. Butter. Margarine. Beverages Water. Vegetable juice (no salt added). Decaffeinated coffee. Decaffeinated or herbal tea. Seasonings and condiments Fresh or dried herbs. Low-salt or salt-free seasonings. Low-sodium soy sauce. The items listed above may not be a complete list of foods   and beverages you can eat. Contact a dietitian for more information. What foods should I avoid? Fruits Fruits that cause gas, such as apples or melon. Vegetables Vegetables that cause gas, such as broccoli, Brussels sprouts, cabbage,  cauliflower, and onions. Canned vegetables with added salt. Meats and other proteins Fried meat. Salt-cured meat. Processed meat. Dairy Fat-free or low-fat milk, yogurt, or cheese. Processed cheese. Beverages Carbonated drinks. Caffeinated drinks, such as coffee, tea, and soft drinks. Juice. Alcohol. Vegetable juice with added salt. Seasonings and condiments Salt. Seasoning mixes with salt. Soy sauce. Pickles. Other foods Clear soup or broth. Fried foods. Prepared frozen meals. The items listed above may not be a complete list of foods and beverages you should avoid. Contact a dietitian for more information. Summary COPD symptoms can make it difficult to eat enough to maintain a healthy weight. A COPD eating plan can help you maintain your body weight and keep your lungs as healthy as possible. Eat a diet that is high in calories, protein, and other nutrients. Read labels to make sure that you are getting the right nutrients. Cook foods to make them easier to chew and swallow. Eat 5-6 small meals throughout the day, and avoid foods that cause gas or make you feel bloated. This information is not intended to replace advice given to you by your health care provider. Make sure you discuss any questions you have with your health care provider. Document Revised: 06/19/2020 Document Reviewed: 06/19/2020 Elsevier Patient Education  2024 [employer]  

## 2023-07-14 ENCOUNTER — Ambulatory Visit (INDEPENDENT_AMBULATORY_CARE_PROVIDER_SITE_OTHER): Payer: Medicare HMO | Admitting: Nurse Practitioner

## 2023-07-14 ENCOUNTER — Encounter: Payer: Self-pay | Admitting: Nurse Practitioner

## 2023-07-14 VITALS — BP 103/69 | HR 60 | Temp 97.8°F | Ht 65.0 in | Wt 187.4 lb

## 2023-07-14 DIAGNOSIS — Z0000 Encounter for general adult medical examination without abnormal findings: Secondary | ICD-10-CM | POA: Diagnosis not present

## 2023-07-14 DIAGNOSIS — F17210 Nicotine dependence, cigarettes, uncomplicated: Secondary | ICD-10-CM

## 2023-07-14 DIAGNOSIS — E559 Vitamin D deficiency, unspecified: Secondary | ICD-10-CM | POA: Diagnosis not present

## 2023-07-14 DIAGNOSIS — E782 Mixed hyperlipidemia: Secondary | ICD-10-CM | POA: Diagnosis not present

## 2023-07-14 DIAGNOSIS — R42 Dizziness and giddiness: Secondary | ICD-10-CM | POA: Diagnosis not present

## 2023-07-14 DIAGNOSIS — E66811 Obesity, class 1: Secondary | ICD-10-CM | POA: Diagnosis not present

## 2023-07-14 DIAGNOSIS — F331 Major depressive disorder, recurrent, moderate: Secondary | ICD-10-CM

## 2023-07-14 DIAGNOSIS — E6609 Other obesity due to excess calories: Secondary | ICD-10-CM | POA: Diagnosis not present

## 2023-07-14 DIAGNOSIS — J432 Centrilobular emphysema: Secondary | ICD-10-CM | POA: Diagnosis not present

## 2023-07-14 DIAGNOSIS — R7301 Impaired fasting glucose: Secondary | ICD-10-CM

## 2023-07-14 DIAGNOSIS — I700 Atherosclerosis of aorta: Secondary | ICD-10-CM

## 2023-07-14 DIAGNOSIS — Z9189 Other specified personal risk factors, not elsewhere classified: Secondary | ICD-10-CM

## 2023-07-14 DIAGNOSIS — Z23 Encounter for immunization: Secondary | ICD-10-CM

## 2023-07-14 DIAGNOSIS — Z1231 Encounter for screening mammogram for malignant neoplasm of breast: Secondary | ICD-10-CM | POA: Diagnosis not present

## 2023-07-14 MED ORDER — MECLIZINE HCL 12.5 MG PO TABS: 12.5000 mg | ORAL_TABLET | Freq: Three times a day (TID) | ORAL | 5 refills | Status: DC | PRN

## 2023-07-14 MED ORDER — ATORVASTATIN CALCIUM 10 MG PO TABS: ORAL_TABLET | ORAL | 4 refills | Status: DC

## 2023-07-14 NOTE — Assessment & Plan Note (Signed)
Chronic, noted on initial CT lung screening 01/22/22.  At this time no inhalers or symptoms reported.  Will plan on spirometry at next visit to further assess, discussed with patient.  She agrees with plan of care.  Initiate inhalers as needed.  Recommend complete cessation of smoking.  Continue yearly lung screening, due end of June 2025.

## 2023-07-14 NOTE — Assessment & Plan Note (Signed)
Quit smoking at end of August, praised for this and highly recommend continued cessation -- she does feel better.  Educated her on quitting and benefit to lungs.

## 2023-07-14 NOTE — Assessment & Plan Note (Signed)
Chronic, noted on imaging 01/04/22.  Recommend she continue statin therapy and adjust as needed.  Take Baby ASA 81 MG daily.

## 2023-07-14 NOTE — Assessment & Plan Note (Signed)
Chronic, ongoing with risk factors.  Continue statin and adjust dose as needed. Lipid panel and CMP today.  Return in 6 months for follow-up.  Refills sent in.

## 2023-07-14 NOTE — Assessment & Plan Note (Signed)
Ongoing, noted on past labs. Recheck today and recommend she continue Vitamin D3 2000 units daily.  DEXA was normal.

## 2023-07-14 NOTE — Progress Notes (Signed)
BP 103/69   Pulse 60   Temp 97.8 F (36.6 C) (Oral)   Ht 5\' 5"  (1.651 m)   Wt 187 lb 6.4 oz (85 kg)   SpO2 98%   BMI 31.18 kg/m    Subjective:    Patient ID: Heather Hill, female    DOB: [birth year], 67 y.o.   MRN: [id number]  HPI: Heather Hill is a 67 y.o. female presenting on 07/14/2023 for comprehensive medical examination. Current medical complaints include:none  She currently lives with: self Menopausal Symptoms: no  COPD Quit smoking at end of August, reports she is feeling a lot better.  Last lung screening 01/30/23 -- continues to have borderline mild axillary lymphadenopathy they follow.  Also notes emphysema and aortic atherosclerosis.  Low Vitamin D levels, takes supplement = DEXA normal.  We monitor A1c due to prediabetic levels, last October 6.2%. COPD status: stable Satisfied with current treatment?: yes Oxygen use: no Dyspnea frequency: no Cough frequency: no Rescue inhaler frequency:  none Limitation of activity: no Productive cough: none Last Spirometry: none Pneumovax: Up to Date Influenza: Up to Date   HYPERLIPIDEMIA Continues on Atorvastatin 10 MG daily. Hyperlipidemia status: good compliance Satisfied with current treatment?  yes Side effects:  no Medication compliance: good compliance Past cholesterol meds: atorvastain (lipitor) Supplements: none Aspirin:  no  DEPRESSION Last saw Dr. Vanetta Hill, last visit was 06/18/23.  They placed a referral to therapy, she has not attended as of yet.  Continues on Zoloft and Trazodone.    Past medications: fluoxetine, Abilify, lorazepam  Mood status: stable Satisfied with current treatment?: yes Symptom severity: moderate  Duration of current treatment : chronic Side effects: no Medication compliance: good compliance Psychotherapy/counseling: yes in the past Depressed mood: occasional Anxious mood:  occasional Anhedonia: no Significant weight loss or gain: no Insomnia: controlled with  medication Fatigue: no Feelings of worthlessness or guilt: no Impaired concentration/indecisiveness: no Suicidal ideations: no Hopelessness: no Crying spells: no    07/14/2023   10:07 AM 07/06/2023    9:23 AM 07/06/2023    9:22 AM 05/29/2023    9:22 AM 05/04/2023    9:17 AM  Depression screen PHQ 2/9  Decreased Interest 0 0 0 1 0  Down, Depressed, Hopeless 0 0 0 2 0  PHQ - 2 Score 0 0 0 3 0  Altered sleeping 2   3   Tired, decreased energy 2   3   Change in appetite 2   0   Feeling bad or failure about yourself  0   0   Trouble concentrating 0   2   Moving slowly or fidgety/restless 0   0   Suicidal thoughts 0   0   PHQ-9 Score 6   11   Difficult doing work/chores Not difficult at all   Not difficult at all       07/14/2023   10:08 AM 05/29/2023    9:22 AM 03/03/2023    2:46 PM 02/03/2023    8:30 AM  GAD 7 : Generalized Anxiety Score  Nervous, Anxious, on Edge 2 0 2 0  Control/stop worrying 0 0 2 2  Worry too much - different things 0 0 2 2  Trouble relaxing 0 3 2 2   Restless 0 2 1 0  Easily annoyed or irritable 0 0 2 0  Afraid - awful might happen 0 1 1 0  Total GAD 7 Score 2 6 12 6   Anxiety Difficulty Not difficult at all Not  difficult at all Somewhat difficult Not difficult at all        07/09/2022   10:15 AM 12/08/2022    9:40 AM 12/31/2022    9:19 AM 02/03/2023    8:29 AM 07/14/2023   10:07 AM  Fall Risk  Falls in the past year? 0 0 0 0 0  Was there an injury with Fall? 0 0 0 0 0  Fall Risk Category Calculator 0 0 0 0 0  Fall Risk Category (Retired) Low      (RETIRED) Patient Fall Risk Level Low fall risk      Patient at Risk for Falls Due to No Fall Risks No Fall Risks No Fall Risks No Fall Risks No Fall Risks  Fall risk Follow up Falls evaluation completed Falls evaluation completed;Education provided;Falls prevention discussed Falls evaluation completed Falls evaluation completed Falls evaluation completed    Past Medical History:  Past Medical History:   Diagnosis Date   Anxiety    Depression    Sleep apnea    Vertigo    none for over 8 yrs   Wears dentures    partial upper    Surgical History:  Past Surgical History:  Procedure Laterality Date   AXILLARY LYMPH NODE BIOPSY Left 04/10/2022   Procedure: AXILLARY LYMPH NODE BIOPSY, excisional;  Surgeon: Henrene Dodge, MD;  Location: [facility];  Service: General;  Laterality: Left;   BREAST BIOPSY Left 03/06/2022   stereo bx, calcs, "COIL" clip-path pending   COLONOSCOPY WITH PROPOFOL N/A 02/02/2019   Procedure: COLONOSCOPY WITH PROPOFOL;  Surgeon: Toney Reil, MD;  Location: [facility];  Service: Endoscopy;  Laterality: N/A;   POLYPECTOMY  02/02/2019   Procedure: POLYPECTOMY;  Surgeon: Toney Reil, MD;  Location: [facility];  Service: Endoscopy;;   TOTAL ABDOMINAL HYSTERECTOMY     TUBAL LIGATION      Medications:  Current Outpatient Medications on File Prior to Visit  Medication Sig   acetaminophen (TYLENOL) 500 MG tablet Take 2 tablets (1,000 mg total) by mouth every 6 (six) hours as needed for mild pain.   nicotine (NICODERM CQ - DOSED IN MG/24 HR) 7 mg/24hr patch Place 1 patch (7 mg total) onto the skin daily for 14 days.   sertraline (ZOLOFT) 100 MG tablet Take 1 tablet (100 mg total) by mouth at bedtime.   traZODone (DESYREL) 150 MG tablet Take 1 tablet (150 mg total) by mouth at bedtime as needed for sleep.   No current facility-administered medications on file prior to visit.    Allergies:  Allergies  Allergen Reactions   Penicillins Hives   Peanut-Containing Drug Products Itching and Rash    Peanuts    Social History:  Social History   Socioeconomic History   Marital status: Widowed    Spouse name: Not on file   Number of children: 1   Years of education: Not on file   Highest education level: Some college, no degree  Occupational History   Occupation: retired  Tobacco Use   Smoking status: Former    Current packs/day:  0.00    Average packs/day: 0.3 packs/day for 48.0 years (12.0 ttl pk-yrs)    Types: Cigarettes    Quit date: 04/2023    Years since quitting: 0.2   Smokeless tobacco: Never   Tobacco comments:    10 cigarettes daily- khj 01/28/2023  Vaping Use   Vaping status: Former  Substance and Sexual Activity   Alcohol use: Yes    Alcohol/week:  2.0 standard drinks of alcohol    Types: 2 Glasses of wine per week   Drug use: Never   Sexual activity: Not Currently  Other Topics Concern   Not on file  Social History Narrative   Not on file   Social Determinants of Health   Financial Resource Strain: Low Risk  (05/04/2023)   Overall Financial Resource Strain (CARDIA)    Difficulty of Paying Living Expenses: Not very hard  Food Insecurity: No Food Insecurity (05/04/2023)   Hunger Vital Sign    Worried About Running Out of Food in the Last Year: Never true    Ran Out of Food in the Last Year: Never true  Transportation Needs: No Transportation Needs (05/04/2023)   PRAPARE - Administrator, Civil Service (Medical): No    Lack of Transportation (Non-Medical): No  Physical Activity: Insufficiently Active (05/04/2023)   Exercise Vital Sign    Days of Exercise per Week: 3 days    Minutes of Exercise per Session: 30 min  Stress: No Stress Concern Present (05/04/2023)   [employer] of Occupational Health - Occupational Stress Questionnaire    Feeling of Stress : Only a little  Social Connections: Socially Isolated (07/06/2023)   Social Connection and Isolation Panel [NHANES]    Frequency of Communication with Friends and Family: Three times a week    Frequency of Social Gatherings with Friends and Family: Three times a week    Attends Religious Services: Never    Active Member of Clubs or Organizations: No    Attends Banker Meetings: Never    Marital Status: Widowed  Intimate Partner Violence: Not At Risk (05/04/2023)   Humiliation, Afraid, Rape, and Kick questionnaire     Fear of Current or Ex-Partner: No    Emotionally Abused: No    Physically Abused: No    Sexually Abused: No   Social History   Tobacco Use  Smoking Status Former   Current packs/day: 0.00   Average packs/day: 0.3 packs/day for 48.0 years (12.0 ttl pk-yrs)   Types: Cigarettes   Quit date: 04/2023   Years since quitting: 0.2  Smokeless Tobacco Never  Tobacco Comments   10 cigarettes daily- khj 01/28/2023   Social History   Substance and Sexual Activity  Alcohol Use Yes   Alcohol/week: 2.0 standard drinks of alcohol   Types: 2 Glasses of wine per week    Family History:  Family History  Problem Relation Age of Onset   Diabetes Mother    Alzheimer's disease Mother    Hypertension Father    Asthma Father    Diabetes Father    Bipolar disorder Sister    Diabetes Sister    Breast cancer Maternal Aunt    Diabetes Maternal Grandfather    Alzheimer's disease Maternal Grandfather    Heart disease Maternal Grandfather    Asthma Paternal Grandfather    Diabetes Paternal Grandfather    Hypertension Paternal Grandfather    Diabetes Brother     Past medical history, surgical history, medications, allergies, family history and social history reviewed with patient today and changes made to appropriate areas of the chart.   ROS All other ROS negative except what is listed above and in the HPI.      Objective:    BP 103/69   Pulse 60   Temp 97.8 F (36.6 C) (Oral)   Ht 5\' 5"  (1.651 m)   Wt 187 lb 6.4 oz (85 kg)   SpO2 98%  BMI 31.18 kg/m   Wt Readings from Last 3 Encounters:  07/14/23 187 lb 6.4 oz (85 kg)  05/29/23 183 lb (83 kg)  03/03/23 188 lb 3.2 oz (85.4 kg)    Physical Exam Vitals and nursing note reviewed. Exam conducted with a chaperone present.  Constitutional:      General: She is awake. She is not in acute distress.    Appearance: She is well-developed and well-groomed. She is obese. She is not ill-appearing or toxic-appearing.  HENT:     Head:  Normocephalic and atraumatic.     Right Ear: Hearing, tympanic membrane, ear canal and external ear normal. No drainage.     Left Ear: Hearing, tympanic membrane, ear canal and external ear normal. No drainage.     Nose: Nose normal.     Right Sinus: No maxillary sinus tenderness or frontal sinus tenderness.     Left Sinus: No maxillary sinus tenderness or frontal sinus tenderness.     Mouth/Throat:     Mouth: Mucous membranes are moist.     Pharynx: Oropharynx is clear. Uvula midline. No pharyngeal swelling, oropharyngeal exudate or posterior oropharyngeal erythema.  Eyes:     General: Lids are normal.        Right eye: No discharge.        Left eye: No discharge.     Extraocular Movements: Extraocular movements intact.     Conjunctiva/sclera: Conjunctivae normal.     Pupils: Pupils are equal, round, and reactive to light.     Visual Fields: Right eye visual fields normal and left eye visual fields normal.  Neck:     Thyroid: No thyromegaly.     Vascular: No carotid bruit.     Trachea: Trachea normal.  Cardiovascular:     Rate and Rhythm: Normal rate and regular rhythm.     Heart sounds: Normal heart sounds. No murmur heard.    No gallop.  Pulmonary:     Effort: Pulmonary effort is normal. No accessory muscle usage or respiratory distress.     Breath sounds: Normal breath sounds.  Chest:  Breasts:    Right: Normal.     Left: Normal.  Abdominal:     General: Bowel sounds are normal.     Palpations: Abdomen is soft. There is no hepatomegaly or splenomegaly.     Tenderness: There is no abdominal tenderness.  Musculoskeletal:        General: Normal range of motion.     Cervical back: Normal range of motion and neck supple.     Right lower leg: No edema.     Left lower leg: No edema.  Lymphadenopathy:     Head:     Right side of head: No submental, submandibular, tonsillar, preauricular or posterior auricular adenopathy.     Left side of head: No submental, submandibular,  tonsillar, preauricular or posterior auricular adenopathy.     Cervical: No cervical adenopathy.     Upper Body:     Right upper body: No supraclavicular, axillary or pectoral adenopathy.     Left upper body: No supraclavicular, axillary or pectoral adenopathy.  Skin:    General: Skin is warm and dry.     Capillary Refill: Capillary refill takes less than 2 seconds.     Findings: No rash.  Neurological:     Mental Status: She is alert and oriented to person, place, and time.     Gait: Gait is intact.     Deep Tendon Reflexes: Reflexes are  normal and symmetric.     Reflex Scores:      Brachioradialis reflexes are 2+ on the right side and 2+ on the left side.      Patellar reflexes are 2+ on the right side and 2+ on the left side. Psychiatric:        Attention and Perception: Attention normal.        Mood and Affect: Mood normal.        Speech: Speech normal.        Behavior: Behavior normal. Behavior is cooperative.        Thought Content: Thought content normal.        Judgment: Judgment normal.     Results for orders placed or performed in visit on 05/29/23  HgB A1c  Result Value Ref Range   Hgb A1c MFr Bld 6.2 (H) 4.8 - 5.6 %   Est. average glucose Bld gHb Est-mCnc 131 mg/dL  TSH  Result Value Ref Range   TSH 1.230 0.450 - 4.500 uIU/mL  CBC with Differential/Platelet  Result Value Ref Range   WBC 3.5 3.4 - 10.8 x10E3/uL   RBC 5.05 3.77 - 5.28 x10E6/uL   Hemoglobin 13.3 11.1 - 15.9 g/dL   Hematocrit 16.1 09.6 - 46.6 %   MCV 85 79 - 97 fL   MCH 26.3 (L) 26.6 - 33.0 pg   MCHC 31.1 (L) 31.5 - 35.7 g/dL   RDW 04.5 40.9 - 81.1 %   Platelets 240 150 - 450 x10E3/uL   Neutrophils 45 Not Estab. %   Lymphs 32 Not Estab. %   Monocytes 18 Not Estab. %   Eos 4 Not Estab. %   Basos 1 Not Estab. %   Neutrophils Absolute 1.6 1.4 - 7.0 x10E3/uL   Lymphocytes Absolute 1.1 0.7 - 3.1 x10E3/uL   Monocytes Absolute 0.6 0.1 - 0.9 x10E3/uL   EOS (ABSOLUTE) 0.1 0.0 - 0.4 x10E3/uL    Basophils Absolute 0.0 0.0 - 0.2 x10E3/uL   Immature Granulocytes 0 Not Estab. %   Immature Grans (Abs) 0.0 0.0 - 0.1 x10E3/uL  Comprehensive metabolic panel  Result Value Ref Range   Glucose 113 (H) 70 - 99 mg/dL   BUN 10 8 - 27 mg/dL   Creatinine, Ser 9.14 0.57 - 1.00 mg/dL   eGFR 78 >78 GN/FAO/1.30   BUN/Creatinine Ratio 12 12 - 28   Sodium 143 134 - 144 mmol/L   Potassium 4.2 3.5 - 5.2 mmol/L   Chloride 104 96 - 106 mmol/L   CO2 24 20 - 29 mmol/L   Calcium 9.1 8.7 - 10.3 mg/dL   Total Protein 7.0 6.0 - 8.5 g/dL   Albumin 4.1 3.9 - 4.9 g/dL   Globulin, Total 2.9 1.5 - 4.5 g/dL   Bilirubin Total 0.2 0.0 - 1.2 mg/dL   Alkaline Phosphatase 103 44 - 121 IU/L   AST 19 0 - 40 IU/L   ALT 15 0 - 32 IU/L      Assessment & Plan:   Problem List Items Addressed This Visit       Cardiovascular and Mediastinum   Aortic atherosclerosis (HCC)    Chronic, noted on imaging 01/04/22.  Recommend she continue statin therapy and adjust as needed.  Take Baby ASA 81 MG daily.      Relevant Medications   atorvastatin (LIPITOR) 10 MG tablet   Other Relevant Orders   Comprehensive metabolic panel   CBC with Differential/Platelet   Lipid Panel w/o Chol/HDL Ratio  Respiratory   Centrilobular emphysema (HCC) - Primary    Chronic, noted on initial CT lung screening 01/22/22.  At this time no inhalers or symptoms reported.  Will plan on spirometry at next visit to further assess, discussed with patient.  She agrees with plan of care.  Initiate inhalers as needed.  Recommend complete cessation of smoking.  Continue yearly lung screening, due end of June 2025.      Relevant Orders   CBC with Differential/Platelet     Endocrine   IFG (impaired fasting glucose)    Noted on labs and did take Abilify for mood at time, will check A1c today.  Recommend ongoing diet focus.      Relevant Orders   HgB A1c     Other   Hyperlipidemia    Chronic, ongoing with risk factors.  Continue statin and  adjust dose as needed. Lipid panel and CMP today.  Return in 6 months for follow-up.  Refills sent in.      Relevant Medications   atorvastatin (LIPITOR) 10 MG tablet   Other Relevant Orders   Comprehensive metabolic panel   Lipid Panel w/o Chol/HDL Ratio   Moderate episode of recurrent major depressive disorder (HCC)    Chronic, ongoing.  Denies SI/HI.  Does have a safety plan in place if intrusive thoughts were to present, would reach out to daughter or go to ER.  Currently followed by psychiatry, Dr. Vanetta Hill.  Continue current regimen at this time and defer changes to psychiatry.        Nicotine dependence, cigarettes, uncomplicated    Quit smoking at end of August, praised for this and highly recommend continued cessation -- she does feel better.  Educated her on quitting and benefit to lungs.      Obesity    BMI 31.18.  Recommended eating smaller high protein, low fat meals more frequently and exercising 30 mins a day 5 times a week with a goal of 10-15lb weight loss in the next 3 months. Patient voiced their understanding and motivation to adhere to these recommendations.       Vitamin D deficiency    Ongoing, noted on past labs. Recheck today and recommend she continue Vitamin D3 2000 units daily.  DEXA was normal.      Relevant Orders   VITAMIN D 25 Hydroxy (Vit-D Deficiency, Fractures)   Other Visit Diagnoses     Vertigo       Chronic, stable. Refill given on Meclizine 12.5mg .   Relevant Medications   meclizine (ANTIVERT) 12.5 MG tablet   Encounter for screening mammogram for malignant neoplasm of breast       Mammogram ordered.   Relevant Orders   MM 3D SCREENING MAMMOGRAM BILATERAL BREAST   Encounter for annual physical exam       Annual physical today with labs and health maintenance reviewed, discussed with patient.        Follow up plan: Return in about 6 months (around 01/11/2024) for HLD, MOOD, COPD.   LABORATORY TESTING:  - Pap smear: not  applicable  IMMUNIZATIONS:   - Tdap: Tetanus vaccination status reviewed: last tetanus booster within 10 years. - Influenza: Up to date - Pneumovax: not applicable - Prevnar: Up to date - COVID: Up to date - HPV: Not applicable - Shingrix vaccine: Refused  SCREENING: -Mammogram: Ordered today  - Colonoscopy: Up to date  - Bone Density: Up to date  -Hearing Test: Not applicable  -Spirometry: Refused   PATIENT COUNSELING:  Advised to take 1 mg of folate supplement per day if capable of pregnancy.   Sexuality: Discussed sexually transmitted diseases, partner selection, use of condoms, avoidance of unintended pregnancy  and contraceptive alternatives.   Advised to avoid cigarette smoking.  I discussed with the patient that most people either abstain from alcohol or drink within safe limits (<=14/week and <=4 drinks/occasion for males, <=7/weeks and <= 3 drinks/occasion for females) and that the risk for alcohol disorders and other health effects rises proportionally with the number of drinks per week and how often a drinker exceeds daily limits.  Discussed cessation/primary prevention of drug use and availability of treatment for abuse.   Diet: Encouraged to adjust caloric intake to maintain  or achieve ideal body weight, to reduce intake of dietary saturated fat and total fat, to limit sodium intake by avoiding high sodium foods and not adding table salt, and to maintain adequate dietary potassium and calcium preferably from fresh fruits, vegetables, and low-fat dairy products.    Stressed the importance of regular exercise  Injury prevention: Discussed safety belts, safety helmets, smoke detector, smoking near bedding or upholstery.   Dental health: Discussed importance of regular tooth brushing, flossing, and dental visits.    NEXT PREVENTATIVE PHYSICAL DUE IN 1 YEAR. Return in about 6 months (around 01/11/2024) for HLD, MOOD, COPD.

## 2023-07-14 NOTE — Assessment & Plan Note (Signed)
BMI 31.18.  Recommended eating smaller high protein, low fat meals more frequently and exercising 30 mins a day 5 times a week with a goal of 10-15lb weight loss in the next 3 months. Patient voiced their understanding and motivation to adhere to these recommendations.  

## 2023-07-14 NOTE — Assessment & Plan Note (Signed)
Noted on labs and did take Abilify for mood at time, will check A1c today.  Recommend ongoing diet focus.

## 2023-07-14 NOTE — Assessment & Plan Note (Signed)
Chronic, ongoing.  Denies SI/HI.  Does have a safety plan in place if intrusive thoughts were to present, would reach out to daughter or go to ER.  Currently followed by psychiatry, Dr. Vanetta Shawl.  Continue current regimen at this time and defer changes to psychiatry.

## 2023-07-15 ENCOUNTER — Ambulatory Visit: Payer: Self-pay

## 2023-07-15 LAB — COMPREHENSIVE METABOLIC PANEL
ALT: 12 [IU]/L (ref 0–32)
AST: 14 [IU]/L (ref 0–40)
Albumin: 4.3 g/dL (ref 3.9–4.9)
Alkaline Phosphatase: 104 [IU]/L (ref 44–121)
BUN/Creatinine Ratio: 15 (ref 12–28)
BUN: 13 mg/dL (ref 8–27)
Bilirubin Total: 0.3 mg/dL (ref 0.0–1.2)
CO2: 25 mmol/L (ref 20–29)
Calcium: 9.1 mg/dL (ref 8.7–10.3)
Chloride: 103 mmol/L (ref 96–106)
Creatinine, Ser: 0.88 mg/dL (ref 0.57–1.00)
Globulin, Total: 3.3 g/dL (ref 1.5–4.5)
Glucose: 97 mg/dL (ref 70–99)
Potassium: 4.2 mmol/L (ref 3.5–5.2)
Sodium: 143 mmol/L (ref 134–144)
Total Protein: 7.6 g/dL (ref 6.0–8.5)
eGFR: 72 mL/min/{1.73_m2} (ref >59)

## 2023-07-15 LAB — CBC WITH DIFFERENTIAL/PLATELET
Basophils Absolute: 0.1 10*3/uL (ref 0.0–0.2)
Basos: 1 %
EOS (ABSOLUTE): 0.2 10*3/uL (ref 0.0–0.4)
Eos: 4 %
Hematocrit: 42.6 % (ref 34.0–46.6)
Hemoglobin: 13.3 g/dL (ref 11.1–15.9)
Immature Grans (Abs): 0 10*3/uL (ref 0.0–0.1)
Immature Granulocytes: 0 %
Lymphocytes Absolute: 2.3 10*3/uL (ref 0.7–3.1)
Lymphs: 41 %
MCH: 26.4 pg — ABNORMAL LOW (ref 26.6–33.0)
MCHC: 31.2 g/dL — ABNORMAL LOW (ref 31.5–35.7)
MCV: 85 fL (ref 79–97)
Monocytes Absolute: 0.5 10*3/uL (ref 0.1–0.9)
Monocytes: 8 %
Neutrophils Absolute: 2.7 10*3/uL (ref 1.4–7.0)
Neutrophils: 46 %
Platelets: 281 10*3/uL (ref 150–450)
RBC: 5.04 x10E6/uL (ref 3.77–5.28)
RDW: 14 % (ref 11.7–15.4)
WBC: 5.7 10*3/uL (ref 3.4–10.8)

## 2023-07-15 LAB — HEMOGLOBIN A1C
Est. average glucose Bld gHb Est-mCnc: 143 mg/dL
Hgb A1c MFr Bld: 6.6 % — ABNORMAL HIGH (ref 4.8–5.6)

## 2023-07-15 LAB — VITAMIN D 25 HYDROXY (VIT D DEFICIENCY, FRACTURES): Vit D, 25-Hydroxy: 37 ng/mL (ref 30.0–100.0)

## 2023-07-15 LAB — LIPID PANEL W/O CHOL/HDL RATIO
Cholesterol, Total: 147 mg/dL (ref 100–199)
HDL: 48 mg/dL (ref >39)
LDL Chol Calc (NIH): 83 mg/dL (ref 0–99)
Triglycerides: 83 mg/dL (ref 0–149)
VLDL Cholesterol Cal: 16 mg/dL (ref 5–40)

## 2023-07-15 NOTE — Patient Outreach (Signed)
  Care Coordination   Initial Visit Note   07/15/2023 Name: Heather Hill MRN: [id number] DOB: April 22, 1956  Heather Hill is a 67 y.o. year old female who sees [place], Corrie Dandy T, NP for primary care. I spoke with  Heather Hill by phone today.  What matters to the patients health and wellness today?  Patient needs assistance with bills and budgeting.    Goals Addressed             This Visit's Progress    Care Coordination Activities       Interventions Today    Flowsheet Row Most Recent Value  General Interventions   General Interventions Discussed/Reviewed General Interventions Discussed, General Interventions Reviewed, [employer]  [Pt needs assistance with bills.SW provides budgeting. SW recommends [employer] & [employer].Pt receives SSA $1811 & works pt-time $390 [employer]. BIlls paying past due bills caused crisis.Pt agreed to reduce cable and extra care insurance.]              SDOH assessments and interventions completed:  Yes  SDOH Interventions Today    Flowsheet Row Most Recent Value  SDOH Interventions   Food Insecurity Interventions Intervention Not Indicated  Housing Interventions Intervention Not Indicated  Transportation Interventions Intervention Not Indicated  Utilities Interventions Other (Comment)  [Patient is making payment arrangements on old bill]        Care Coordination Interventions:  Yes, provided   Follow up plan: Follow up call scheduled for 08/12/23 at 3pm    Encounter Outcome:  Patient Visit Completed

## 2023-07-15 NOTE — Progress Notes (Signed)
Attempted to reach patient, LVM to call office back to schedule 3 month follow up appointment.  Put in CRM.

## 2023-07-15 NOTE — Progress Notes (Signed)
Contacted via MyChart -- needs return visit in 3 months please for A1c recheck   Good morning Heather Hill, your labs have returned: - CBC overall stable with no anemia or infection - Kidney function, creatinine and eGFR, remains normal, as is liver function, AST and ALT.  - A1c is 6.6%, this is just entering diabetes level -- any number 6.5% or greater is considered diabetes.  I would like you to heavily focus on healthy diet changes over next 3 months and regular exercise.  If A1c remains this level or higher in 3 months we will need to discuss medication.  I will have staff call to schedule visit back with me in 3 months.  Any questions? Keep being amazing!!  Thank you for allowing me to participate in your care.  I appreciate you. Kindest regards, Jazelle Achey

## 2023-07-15 NOTE — Patient Instructions (Signed)
Visit Information  Thank you for taking time to visit with me today. Please don't hesitate to contact me if I can be of assistance to you.   Following are the goals we discussed today:  Patient to reduce cable and turn in box in December. Patient to reduce insurance coverage on vehicle that does not work. Patient to apply for LIEAP program. Patient to contact [employer] to apply for Equal Payment Plan.   Our next appointment is by telephone on 08/12/23 at 3pm  Please call the care guide team at [phone] if you need to cancel or reschedule your appointment.   If you are experiencing a Mental Health or Behavioral Health Crisis or need someone to talk to, please call 911  Patient verbalizes understanding of instructions and care plan provided today and agrees to view in MyChart. Active MyChart status and patient understanding of how to access instructions and care plan via MyChart confirmed with patient.     Telephone follow up appointment with care management team member scheduled for: 08/12/23 at 3pm.  Lysle Morales, BSW Social Worker [phone]

## 2023-07-20 ENCOUNTER — Telehealth: Payer: Self-pay | Admitting: Nurse Practitioner

## 2023-07-20 NOTE — Telephone Encounter (Signed)
Is the patient due for a bone density test?

## 2023-07-20 NOTE — Telephone Encounter (Signed)
Pt is calling in because she called to schedule a mammogram and was told she could do the bone density test the same day if Jolene approves it. Pt would like Jolene to approve her having a bone density. Please advise.

## 2023-07-20 NOTE — Telephone Encounter (Signed)
Called and notified patient of Heather Hill's message.
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
## 2023-07-28 ENCOUNTER — Ambulatory Visit: Payer: Medicare HMO | Admitting: Emergency Medicine

## 2023-07-28 VITALS — Ht 65.0 in | Wt 183.0 lb

## 2023-07-28 DIAGNOSIS — Z0000 Encounter for general adult medical examination without abnormal findings: Secondary | ICD-10-CM | POA: Diagnosis not present

## 2023-07-28 NOTE — Patient Instructions (Addendum)
Ms. Gildon , Thank you for taking time to come for your Medicare Wellness Visit. I appreciate your ongoing commitment to your health goals. Please review the following plan we discussed and let me know if I can assist you in the future.   Referrals/Orders/Follow-Ups/Clinician Recommendations:   Call [facility] Radiology scheduling at [phone] to schedule a low dose lung CT scan to screen for lung cancer.  This is a list of the screening recommended for you and due dates:  Health Maintenance  Topic Date Due   Zoster (Shingles) Vaccine (1 of 2) 10/12/2023*   COVID-19 Vaccine (6 - 2023-24 season) 09/02/2023   Screening for Lung Cancer  01/26/2024   Mammogram  02/13/2024   Medicare Annual Wellness Visit  07/27/2024   Colon Cancer Screening  02/01/2026   DTaP/Tdap/Td vaccine (2 - Td or Tdap) 01/18/2029   DEXA scan (bone density measurement)  03/04/2032   Pneumonia Vaccine  Completed   Flu Shot  Completed   Hepatitis C Screening  Completed   HPV Vaccine  Aged Out  *Topic was postponed. The date shown is not the original due date.    Advanced directives: (ACP Link)Information on Advanced Care Planning can be found at [facility] [place] Advance Health Care Directives Advance Health Care Directives ([email or web address]   Once you have completed the forms, please bring a copy of your health care power of attorney and living will to the office to be added to your chart at your convenience.   Next Medicare Annual Wellness Visit scheduled for next year: Yes, 08/02/24 @ 3:50pm

## 2023-07-28 NOTE — Progress Notes (Signed)
Subjective:   Heather Hill is a 67 y.o. female who presents for Medicare Annual (Subsequent) preventive examination.  Visit Complete: Virtual I connected with  Heather Hill on 07/28/23 by a audio enabled telemedicine application and verified that I am speaking with the correct person using two identifiers.  Patient Location: Home  Provider Location: Home Office  I discussed the limitations of evaluation and management by telemedicine. The patient expressed understanding and agreed to proceed.  Vital Signs: Because this visit was a virtual/telehealth visit, some criteria may be missing or patient reported. Any vitals not documented were not able to be obtained and vitals that have been documented are patient reported.   Cardiac Risk Factors include: advanced age (>2men, >26 women);dyslipidemia;obesity (BMI >30kg/m2);smoking/ tobacco exposure;Other (see comment), Risk factor comments: Aortic Atherosclerosis     Objective:    Today's Vitals   07/28/23 1545  Weight: 183 lb (83 kg)  Height: 5\' 5"  (1.651 m)   Body mass index is 30.45 kg/m.     07/28/2023    4:00 PM 06/11/2022    9:34 AM 04/10/2022    6:15 AM 04/02/2022    3:47 PM 02/02/2019    7:25 AM  Advanced Directives  Does Patient Have a Medical Advance Directive? No No No No No  Would patient like information on creating a medical advance directive? Yes (MAU/Ambulatory/Procedural Areas - Information given) No - Patient declined   No - Patient declined    Current Medications (verified) Outpatient Encounter Medications as of 07/28/2023  Medication Sig   acetaminophen (TYLENOL) 500 MG tablet Take 2 tablets (1,000 mg total) by mouth every 6 (six) hours as needed for mild pain.   atorvastatin (LIPITOR) 10 MG tablet TAKE 1 TABLET(10 MG) BY MOUTH DAILY   meclizine (ANTIVERT) 12.5 MG tablet Take 1 tablet (12.5 mg total) by mouth 3 (three) times daily as needed for dizziness.   sertraline (ZOLOFT) 100 MG tablet Take 1  tablet (100 mg total) by mouth at bedtime.   traZODone (DESYREL) 150 MG tablet Take 1 tablet (150 mg total) by mouth at bedtime as needed for sleep.   No facility-administered encounter medications on file as of 07/28/2023.    Allergies (verified) Penicillins and Peanut-containing drug products   History: Past Medical History:  Diagnosis Date   Anxiety    Depression    Sleep apnea    Vertigo    none for over 8 yrs   Wears dentures    partial upper   Past Surgical History:  Procedure Laterality Date   AXILLARY LYMPH NODE BIOPSY Left 04/10/2022   Procedure: AXILLARY LYMPH NODE BIOPSY, excisional;  Surgeon: Henrene Dodge, MD;  Location: [facility];  Service: General;  Laterality: Left;   BREAST BIOPSY Left 03/06/2022   stereo bx, calcs, "COIL" clip-path pending   COLONOSCOPY WITH PROPOFOL N/A 02/02/2019   Procedure: COLONOSCOPY WITH PROPOFOL;  Surgeon: Toney Reil, MD;  Location: [facility];  Service: Endoscopy;  Laterality: N/A;   POLYPECTOMY  02/02/2019   Procedure: POLYPECTOMY;  Surgeon: Toney Reil, MD;  Location: [facility];  Service: Endoscopy;;   TOTAL ABDOMINAL HYSTERECTOMY     TUBAL LIGATION     Family History  Problem Relation Age of Onset   Diabetes Mother    Alzheimer's disease Mother    Hypertension Father    Asthma Father    Diabetes Father    Bipolar disorder Sister    Diabetes Sister    Diabetes Brother  Breast cancer Maternal Aunt    Diabetes Maternal Grandfather    Alzheimer's disease Maternal Grandfather    Heart disease Maternal Grandfather    Asthma Paternal Grandfather    Diabetes Paternal Grandfather    Hypertension Paternal Grandfather    Social History   Socioeconomic History   Marital status: Widowed    Spouse name: Not on file   Number of children: 2   Years of education: Not on file   Highest education level: Some college, no degree  Occupational History   Occupation: retired  Tobacco Use   Smoking  status: Former    Current packs/day: 0.00    Average packs/day: 0.5 packs/day for 50.7 years (25.3 ttl pk-yrs)    Types: Cigarettes    Start date: 89    Quit date: 04/2023    Years since quitting: 0.2   Smokeless tobacco: Never   Tobacco comments:    10 cigarettes daily- khj 01/28/2023  Vaping Use   Vaping status: Former   Quit date: 07/27/2013   Substances: Nicotine, Flavoring  Substance and Sexual Activity   Alcohol use: Yes    Alcohol/week: 2.0 standard drinks of alcohol    Types: 2 Glasses of wine per week    Comment: 1 glass of wine or beer 1-2 times per week   Drug use: Never   Sexual activity: Not Currently  Other Topics Concern   Not on file  Social History Narrative   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   1 child living and 1 deceased son [name])   Social Determinants of Health   Financial Resource Strain: Low Risk  (07/28/2023)   Overall Financial Resource Strain (CARDIA)    Difficulty of Paying Living Expenses: Not hard at all  Food Insecurity: No Food Insecurity (07/28/2023)   Hunger Vital Sign    Worried About Running Out of Food in the Last Year: Never true    Ran Out of Food in the Last Year: Never true  Transportation Needs: No Transportation Needs (07/28/2023)   PRAPARE - Administrator, Civil Service (Medical): No    Lack of Transportation (Non-Medical): No  Physical Activity: Inactive (07/28/2023)   Exercise Vital Sign    Days of Exercise per Week: 0 days    Minutes of Exercise per Session: 0 min  Stress: No Stress Concern Present (07/28/2023)   [employer] of Occupational Health - Occupational Stress Questionnaire    Feeling of Stress : Only a little  Social Connections: Socially Isolated (07/28/2023)   Social Connection and Isolation Panel [NHANES]    Frequency of Communication with Friends and Family: More than three times a week    Frequency of Social Gatherings with Friends and Family: More than three times a week     Attends Religious Services: Never    Database administrator or Organizations: No    Attends Banker Meetings: Never    Marital Status: Widowed    Tobacco Counseling Counseling given: Not Answered Tobacco comments: 10 cigarettes daily- khj 01/28/2023   Clinical Intake:  Pre-visit preparation completed: Yes  Pain : No/denies pain     BMI - recorded: 30.45 Nutritional Status: BMI > 30  Obese Nutritional Risks: None Diabetes: No  How often do you need to have someone help you when you read instructions, pamphlets, or other written materials from your doctor or pharmacy?: 1 - Never  Interpreter Needed?: No  Information entered by :: Gunnar Fusi  Maisie Fus, CMA   Activities of Daily Living    07/28/2023    3:48 PM  In your present state of health, do you have any difficulty performing the following activities:  Hearing? 0  Vision? 0  Difficulty concentrating or making decisions? 0  Walking or climbing stairs? 1  Comment has difficulty picking her legs up  Dressing or bathing? 0  Doing errands, shopping? 0  Preparing Food and eating ? N  Using the Toilet? N  In the past six months, have you accidently leaked urine? N  Do you have problems with loss of bowel control? N  Managing your Medications? N  Managing your Finances? N  Housekeeping or managing your Housekeeping? N    Patient Care Team: Marjie Skiff, NP as PCP - General (Nurse Practitioner) Rodney Langton, RN as Case Manager (General Practice) Graciella Freer Care Management  Indicate any recent Medical Services you may have received from other than [facility] providers in the past year (date may be approximate).     Assessment:   This is a routine wellness examination for Heather Hill.  Hearing/Vision screen Hearing Screening - Comments:: Denies hearing loss Vision Screening - Comments:: Gets eye exams   Goals Addressed               This Visit's Progress     Increase physical  activity (pt-stated)        Depression Screen    07/28/2023    3:57 PM 07/14/2023   10:07 AM 07/06/2023    9:23 AM 07/06/2023    9:22 AM 05/29/2023    9:22 AM 05/04/2023    9:17 AM 03/03/2023    2:46 PM  PHQ 2/9 Scores  PHQ - 2 Score 0 0 0 0 3 0   PHQ- 9 Score 0 6   11       Information is confidential and restricted. Go to Review Flowsheets to unlock data.     Fall Risk    07/28/2023    4:01 PM 07/14/2023   10:07 AM 02/03/2023    8:29 AM 12/31/2022    9:19 AM 12/08/2022    9:40 AM  Fall Risk   Falls in the past year? 0 0 0 0 0  Number falls in past yr: 0 0 0 0 0  Injury with Fall? 0 0 0 0 0  Risk for fall due to : No Fall Risks No Fall Risks No Fall Risks No Fall Risks No Fall Risks  Follow up Falls prevention discussed Falls evaluation completed Falls evaluation completed Falls evaluation completed Falls evaluation completed;Education provided;Falls prevention discussed    MEDICARE RISK AT HOME: Medicare Risk at Home Any stairs in or around the home?: Yes If so, are there any without handrails?: No Home free of loose throw rugs in walkways, pet beds, electrical cords, etc?: Yes Adequate lighting in your home to reduce risk of falls?: Yes Life alert?: No Use of a cane, walker or w/c?: No Grab bars in the bathroom?: No Shower chair or bench in shower?: No Elevated toilet seat or a handicapped toilet?: Yes  TIMED UP AND GO:  Was the test performed?  No    Cognitive Function:        07/28/2023    4:03 PM 06/11/2022    9:35 AM  6CIT Screen  What Year? 0 points 0 points  What month? 0 points 0 points  What time? 0 points 0 points  Count back from 20 0  points 0 points  Months in reverse 0 points 4 points  Repeat phrase 0 points 0 points  Total Score 0 points 4 points    Immunizations Immunization History  Administered Date(s) Administered   Fluad Quad(high Dose 65+) 07/09/2022   Influenza,inj,Quad PF,6+ Mos 06/10/2019   Influenza-Unspecified 06/17/2021,  06/09/2023   Moderna Sars-Covid-2 Vaccination 10/05/2019, 11/02/2019, 07/26/2020, 06/26/2021, 07/08/2023   PNEUMOCOCCAL CONJUGATE-20 01/03/2022   PPD Test 05/23/2016, 04/06/2020   Tdap 01/19/2019    TDAP status: Up to date  Flu Vaccine status: Up to date  Pneumococcal vaccine status: Up to date  Covid-19 vaccine status: Completed vaccines  Qualifies for Shingles Vaccine? Yes   Zostavax completed No   Shingrix Completed?: No.    Education has been provided regarding the importance of this vaccine. Patient has been advised to call insurance company to determine out of pocket expense if they have not yet received this vaccine. Advised may also receive vaccine at local pharmacy or Health Dept. Verbalized acceptance and understanding.  Screening Tests Health Maintenance  Topic Date Due   Zoster Vaccines- Shingrix (1 of 2) 10/12/2023 (Originally 05/08/1975)   COVID-19 Vaccine (6 - 2023-24 season) 09/02/2023   MAMMOGRAM  02/13/2024   Medicare Annual Wellness (AWV)  07/27/2024   Colonoscopy  02/01/2026   DTaP/Tdap/Td (2 - Td or Tdap) 01/18/2029   DEXA SCAN  03/04/2032   Pneumonia Vaccine 98+ Years old  Completed   INFLUENZA VACCINE  Completed   Hepatitis C Screening  Completed   HPV VACCINES  Aged Out   Lung Cancer Screening  Discontinued    Health Maintenance  There are no preventive care reminders to display for this patient.   Colorectal cancer screening: Type of screening: Colonoscopy. Completed 02/02/19. Repeat every 7 years  Mammogram status: Completed 02/12/22. Repeat every year Scheduled 08/10/23.  Bone Density status: Completed 03/04/22. Results reflect: Bone density results: NORMAL. Repeat every 10 years.  Lung Cancer Screening: (Low Dose CT Chest recommended if Age 6-80 years, 20 pack-year currently smoking OR have quit w/in 15years.) does qualify.   Lung Cancer Screening Referral: 01/30/23  Additional Screening:  Hepatitis C Screening: does not qualify; Completed  01/19/19  Vision Screening: Recommended annual ophthalmology exams for early detection of glaucoma and other disorders of the eye.   Dental Screening: Recommended annual dental exams for proper oral hygiene   Community Resource Referral / Chronic Care Management: CRR required this visit?  No   CCM required this visit?  No     Plan:     I have personally reviewed and noted the following in the patient's chart:   Medical and social history Use of alcohol, tobacco or illicit drugs  Current medications and supplements including opioid prescriptions. Patient is not currently taking opioid prescriptions. Functional ability and status Nutritional status Physical activity Advanced directives List of other physicians Hospitalizations, surgeries, and ER visits in previous 12 months Vitals Screenings to include cognitive, depression, and falls Referrals and appointments  In addition, I have reviewed and discussed with patient certain preventive protocols, quality metrics, and best practice recommendations. A written personalized care plan for preventive services as well as general preventive health recommendations were provided to patient.     Tora Kindred, CMA   07/28/2023   After Visit Summary: (MyChart) Due to this being a telephonic visit, the after visit summary with patients personalized plan was offered to patient via MyChart   Nurse Notes:  Gave patient phone # to call and schedule a LDCT  ordered 01/26/23 by pulmonology Patient is undecided about getting shingles vaccine.

## 2023-07-29 ENCOUNTER — Encounter: Payer: Self-pay | Admitting: Psychiatry

## 2023-07-29 ENCOUNTER — Telehealth (INDEPENDENT_AMBULATORY_CARE_PROVIDER_SITE_OTHER): Payer: Medicare HMO | Admitting: Psychiatry

## 2023-07-29 DIAGNOSIS — F3341 Major depressive disorder, recurrent, in partial remission: Secondary | ICD-10-CM

## 2023-07-29 DIAGNOSIS — G4700 Insomnia, unspecified: Secondary | ICD-10-CM | POA: Diagnosis not present

## 2023-07-29 MED ORDER — SERTRALINE HCL 100 MG PO TABS: 100.0000 mg | ORAL_TABLET | Freq: Every day | ORAL | 1 refills | Status: DC

## 2023-07-29 NOTE — Patient Instructions (Signed)
Continue sertraline 100 mg at night  Continue trazodone 150 mg at night as needed for insomnia Next appointment: 2/12 at 4 30

## 2023-08-10 ENCOUNTER — Ambulatory Visit
Admission: RE | Admit: 2023-08-10 | Discharge: 2023-08-10 | Disposition: A | Discharge status: Home / Self Care | Payer: Medicare HMO | Source: Ambulatory Visit | Attending: Nurse Practitioner | Admitting: Nurse Practitioner

## 2023-08-10 DIAGNOSIS — Z1231 Encounter for screening mammogram for malignant neoplasm of breast: Secondary | ICD-10-CM | POA: Diagnosis not present

## 2023-08-12 ENCOUNTER — Ambulatory Visit: Payer: Self-pay

## 2023-08-12 NOTE — Patient Instructions (Signed)
Visit Information  Thank you for taking time to visit with me today. Please don't hesitate to contact me if I can be of assistance to you.   Following are the goals we discussed today:   Goals Addressed             This Visit's Progress    Care Coordination Activities       Interventions Today    Flowsheet Row Most Recent Value  General Interventions   General Interventions Discussed/Reviewed General Interventions Discussed, General Interventions Reviewed  [Pt paid power, insurance, dental and lights. Has not turned in cable box, but plans to. Reduced coverage on car that is parked.Applied for LIEAP.Pt will call [employer] to set up EPP to reduce monthly bill.Taxed due 08/26/23, pt will call for payment plan]              Our next appointment is by telephone on 021/07/25 at 3pm  Please call the care guide team at [phone] if you need to cancel or reschedule your appointment.   If you are experiencing a Mental Health or Behavioral Health Crisis or need someone to talk to, please call 911  Patient verbalizes understanding of instructions and care plan provided today and agrees to view in MyChart. Active MyChart status and patient understanding of how to access instructions and care plan via MyChart confirmed with patient.     Telephone follow up appointment with care management team member scheduled for:09/01/23 at 3pm.  Lysle Morales, BSW Social Worker [phone]

## 2023-08-12 NOTE — Patient Outreach (Signed)
  Care Coordination   Follow Up Visit Note   08/12/2023 Name: Heather Hill MRN: [id number] DOB: November 04, 1955  Heather Hill is a 67 y.o. year old female who sees [place], Corrie Dandy T, NP for primary care. I spoke with  Heather Hill by phone today.  What matters to the patients health and wellness today?  Patient needs assistance with budgeting to reduce bills to fall within her budget.    Goals Addressed             This Visit's Progress    Care Coordination Activities       Interventions Today    Flowsheet Row Most Recent Value  General Interventions   General Interventions Discussed/Reviewed General Interventions Discussed, General Interventions Reviewed  [Pt paid power, insurance, dental and lights. Has not turned in cable box, but plans to. Reduced coverage on car that is parked.Applied for LIEAP.Pt will call [employer] to set up EPP to reduce monthly bill.Taxed due 08/26/23, pt will call for payment plan]              SDOH assessments and interventions completed:  No     Care Coordination Interventions:  Yes, provided   Follow up plan: Follow up call scheduled for 09/01/23 at 3pm    Encounter Outcome:  Patient Visit Completed

## 2023-09-01 ENCOUNTER — Ambulatory Visit: Payer: Self-pay

## 2023-09-01 NOTE — Patient Instructions (Signed)
 Visit Information  Thank you for taking time to visit with me today. Please don't hesitate to contact me if I can be of assistance to you.   Following are the goals we discussed today:  Patient to use savings to pay cable and electric bill. Patient will provide additional paperwork to [facility] program.   If you are experiencing a Mental Health or Behavioral Health Crisis or need someone to talk to, please call 911  Patient verbalizes understanding of instructions and care plan provided today and agrees to view in MyChart. Active MyChart status and patient understanding of how to access instructions and care plan via MyChart confirmed with patient.     No further follow up required: Patient does not request an additional follow up.  Tillman Gardener, BSW Social Worker [phone]

## 2023-09-01 NOTE — Patient Outreach (Signed)
  Care Coordination   Follow Up Visit Note   09/01/2023 Name: SHABNAM LADD MRN: [id number] DOB: 07-Dec-1955  Darren LITTIE Cowden is a 68 y.o. year old female who sees Cannady, Melanie T, NP for primary care. I spoke with  Darren LITTIE Cowden by phone today.  What matters to the patients health and wellness today?  Patient has been able to resolve financial crisis and has funds to pay bills. Patient reports no unmet need at this time.   Goals Addressed             This Visit's Progress    Care Coordination Activities       Interventions Today    Flowsheet Row Most Recent Value  General Interventions   General Interventions Discussed/Reviewed General Interventions Discussed, General Interventions Reviewed  [Pt discovered mortgage included property taxes and she is doesnt owe a balance.Pt will use the savings to pay $100 cable and $300 light to catch up bills.Pt daughter will turn in 1 cable box to reduce bill.LIEAP application needs more information.]              SDOH assessments and interventions completed:  No     Care Coordination Interventions:  Yes, provided   Follow up plan: No further intervention required.   Encounter Outcome:  Patient Visit Completed

## 2023-09-07 ENCOUNTER — Ambulatory Visit: Payer: Self-pay | Admitting: *Deleted

## 2023-09-07 NOTE — Patient Outreach (Addendum)
 Care Coordination   Follow Up Visit Note   09/07/2023 Name: Heather Hill MRN: [id number] DOB: 10-Jul-1956  Heather Hill is a 68 y.o. year old female who sees Cannady, Melanie T, NP for primary care. I spoke with  Heather Hill by phone today.  What matters to the patients health and wellness today?  Patient state she is doing well with her breathing and depression, lately having pain due to arthritis.  She will try OTC meds while waiting for upcoming PCP visit. Denies any urgent concerns, encouraged to contact this care manager with questions.    Goals Addressed             This Visit's Progress    COMPLETED: RNCM Care Management Expected Outcome:  Monitor, Self-Manage and Reduce Symptoms nq:[id number]   On track    Current Barriers:  Knowledge Deficits related to resources in the community to help with expressed needs to effectively manage her health and wellbeing Care Coordination needs related to medication cost and mental health resources and support  in a patient with depression Chronic Disease Management support and education needs related to effective management of depression Loss of her husband in 08-09-2021, loss of her only son who was in the service in 09-Aug-2002 The patients mother died on Dec 27, 2022, concerns about the estate and the way her siblings are doing things.  Planned Interventions: Evaluation of current treatment plan related to depression  and patient's adherence to plan as established by provider. The patient states that she is doing very good and not having any depression at this time.  Advised patient to call the office for changes in mood, anxiety, depression, or changes in her mental health and well being  Reviewed medications with patient and discussed compliance. The patient states compliance with medications. Provided patient with mindfulness and stress reduction educational materials related to dealing effectively with anxiety and stress.  Social Work  referral for help with questions about social support and if she would qualify for Medicaid. The patient works with the LCSW on a regular basis Pharmacy referral for help with cost constraints related to medication cost Discussed plans with patient for ongoing care management follow up and provided patient with direct contact information for care management team Advised patient to discuss changes in mood, anxiety, depression or other mentla health concerns with provider.  Screening for signs and symptoms of depression related to chronic disease state    Symptom Management: Take medications as prescribed   Attend all scheduled provider appointments Call provider office for new concerns or questions  call the Suicide and Crisis Lifeline: 988 call the [place]  National Suicide Prevention Lifeline: (417)248-1620 or TTY: (916)253-0541 TTY (214)884-7002) to talk to a trained counselor call 1-800-273-TALK (toll free, 24 hour hotline) if experiencing a Mental Health or Behavioral Health Crisis          COMPLETED: RNCM Care Management Maintain, Monitor and Self-Manage Symptoms of COPD   On track    Current Barriers:  Knowledge Deficits related to the importance of smoking cessation in a patient with COPD, support and education for resources to help with management of COPD and other chronic conditions Care Coordination needs related to medication cost and community resources needed in a patient with COPD Chronic Disease Management support and education needs related to Effective management of COPD Former smoker  Planned Interventions: Provided patient with basic written and verbal COPD education on self care/management/and exacerbation prevention. The states her breathing is much better since she stopped  smoking.  Advised patient to track and manage COPD triggers. Review of weather changes and things to monitor for that would cause her to have issues with exacerbations.  Provided instruction about  proper use of medications used for management of COPD including inhalers Advised patient to self assesses COPD action plan zone and make appointment with provider if in the yellow zone for 48 hours without improvement Advised patient to engage in light exercise as tolerated 3-5 days a week to aid in the the management of COPD Provided education about and advised patient to utilize infection prevention strategies to reduce risk of respiratory infection. Review and education given.  Discussed the importance of adequate rest and management of fatigue with COPD Referral made to community resources care guide team for assistance with resources in the community for food insecurities and financial constraints  Screening for signs and symptoms of depression related to chronic disease state     Symptom Management: Take medications as prescribed   Attend all scheduled provider appointments Call provider office for new concerns or questions  call the Suicide and Crisis Lifeline: 988 call the [place]  National Suicide Prevention Lifeline: 651 790 9438 or TTY: (916)327-5314 TTY 205-386-5419) to talk to a trained counselor call 1-800-273-TALK (toll free, 24 hour hotline) if experiencing a Mental Health or Behavioral Health Crisis  eliminate smoking in my home identify and remove indoor air pollutants limit outdoor activity during cold weather listen for public air quality announcements every day do breathing exercises every day develop a rescue plan eliminate symptom triggers at home follow rescue plan if symptoms flare-up           SDOH assessments and interventions completed:  No     Care Coordination Interventions:  Yes, provided   Interventions Today    Flowsheet Row Most Recent Value  Chronic Disease   Chronic disease during today's visit Chronic Obstructive Pulmonary Disease (COPD), Other  [Depression, arthritis]  General Interventions   General Interventions Discussed/Reviewed  General Interventions Reviewed, Doctor Visits  Doctor Visits Discussed/Reviewed Doctor Visits Reviewed, Specialist, PCP  [reminded of [facility] on 2/12 and PCP on 2/21]  PCP/Specialist Visits Compliance with follow-up visit  Exercise Interventions   Exercise Discussed/Reviewed Physical Activity  Physical Activity Discussed/Reviewed Physical Activity Reviewed  [Educated on importance of maintaining physical condition for lung health]  Education Interventions   Education Provided Provided Education  Provided Verbal Education On Medication, Mental Health/Coping with Illness, When to see the doctor, Other  [Report she is coping well with her history of depression, appreciative of resources received from BSW.  Discussed taking Tylenol  for arthritis pain, will also try Aleve]  Mental Health Interventions   Mental Health Discussed/Reviewed Depression, Mental Health Reviewed  [Denies any current issues with depression.  No longer smoking to deal with depression]        Follow up plan: Follow up call scheduled for 2/24    Encounter Outcome:  Patient Visit Completed   Odella Ku, RN, MSN, CCM [place]  [facility], [facility] RN Care Coordinator Direct Dial: [phone] / Main [phone] Fax [phone] Email: [email or web address] Website: [email or web address]

## 2023-10-03 NOTE — Progress Notes (Signed)
Virtual Visit via Video Note  I connected with Heather Hill on 10/07/23 at  4:30 PM EST by a video enabled telemedicine application and verified that I am speaking with the correct person using two identifiers.  Location: Patient: home Provider: office Persons participated in the visit- patient, provider    I discussed the limitations of evaluation and management by telemedicine and the availability of in person appointments. The patient expressed understanding and agreed to proceed.   I discussed the assessment and treatment plan with the patient. The patient was provided an opportunity to ask questions and all were answered. The patient agreed with the plan and demonstrated an understanding of the instructions.   The patient was advised to call back or seek an in-person evaluation if the symptoms worsen or if the condition fails to improve as anticipated.   [name], MD    [facility] MD/PA/NP OP Progress Note  10/07/2023 4:59 PM Heather Hill  MRN:  [id number]  Chief Complaint:  Chief Complaint  Patient presents with   Follow-up   HPI:  This is a follow-up appointment for depression and insomnia.  She states that she also asked a question.  She complains of weakness in her bilateral arms.  It popped when she tried to open the cabinet a few months ago.  She agreed to contact with her provider to consult this.  She states that she has been stressed out.  There is a Network engineer, who is crossing the road.  She has insomnia due to this.  She ruminates on this topic during the visit.  However, she feels okay otherwise.  She goes outside at times.  However, as she does not smoke anymore, she tends to stay inside.  She is trying to get the house organized.  She tries to pack her daughter's items.  She denies feeling depressed.  She has good appetite.  She denies SI.  She feels comfortable to stay on the current medication.   Substance use   Tobacco Alcohol Other substances/  Current  Not since August, on nicotine patch A small beer once in a while  Used to drink Two glasses of vodka twice a week or less denies  Past  1/2 PPD Two glasses of vodka every night (denied DUI/DWI) denies  Past Treatment           Support: Household: by herself Marital status: widow Number of children: 1 daughter (her son deceased at age 50 in 11-04-2001, transferred back from [place]) Employment: CNA part time, [employer], 3 days a week Education: 12th grade, [employer] for two years    Visit Diagnosis:    ICD-10-CM   1. MDD (major depressive disorder), recurrent, in partial remission (HCC)  F33.41     2. Insomnia, unspecified type  G47.00       Past Psychiatric History: Please see initial evaluation for full details. I have reviewed the history. No updates at this time.     Past Medical History:  Past Medical History:  Diagnosis Date   Anxiety    Depression    Sleep apnea    Vertigo    none for over 8 yrs   Wears dentures    partial upper    Past Surgical History:  Procedure Laterality Date   AXILLARY LYMPH NODE BIOPSY Left 04/10/2022   Procedure: AXILLARY LYMPH NODE BIOPSY, excisional;  Surgeon: Henrene Dodge, MD;  Location: [facility] ORS;  Service: General;  Laterality: Left;   BREAST BIOPSY Left 03/06/2022  stereo bx, calcs, "COIL" clip benign   BREAST EXCISIONAL BIOPSY Left 03/2022   excision of lymph node-benign   COLONOSCOPY WITH PROPOFOL N/A 02/02/2019   Procedure: COLONOSCOPY WITH PROPOFOL;  Surgeon: Toney Reil, MD;  Location: [facility];  Service: Endoscopy;  Laterality: N/A;   POLYPECTOMY  02/02/2019   Procedure: POLYPECTOMY;  Surgeon: Toney Reil, MD;  Location: [facility];  Service: Endoscopy;;   TOTAL ABDOMINAL HYSTERECTOMY     TUBAL LIGATION      Family Psychiatric History: Please see initial evaluation for full details. I have reviewed the history. No updates at this time.     Family History:  Family History  Problem Relation  Age of Onset   Diabetes Mother    Alzheimer's disease Mother    Hypertension Father    Asthma Father    Diabetes Father    Bipolar disorder Sister    Diabetes Sister    Diabetes Brother    Breast cancer Maternal Aunt    Diabetes Maternal Grandfather    Alzheimer's disease Maternal Grandfather    Heart disease Maternal Grandfather    Asthma Paternal Grandfather    Diabetes Paternal Grandfather    Hypertension Paternal Grandfather     Social History:  Social History   Socioeconomic History   Marital status: Widowed    Spouse name: Not on file   Number of children: 2   Years of education: Not on file   Highest education level: Some college, no degree  Occupational History   Occupation: retired  Tobacco Use   Smoking status: Former    Current packs/day: 0.00    Average packs/day: 0.5 packs/day for 50.7 years (25.3 ttl pk-yrs)    Types: Cigarettes    Start date: 62    Quit date: 04/2023    Years since quitting: 0.4   Smokeless tobacco: Never   Tobacco comments:    10 cigarettes daily- khj 01/28/2023  Vaping Use   Vaping status: Former   Quit date: 07/27/2013   Substances: Nicotine, Flavoring  Substance and Sexual Activity   Alcohol use: Yes    Alcohol/week: 2.0 standard drinks of alcohol    Types: 2 Glasses of wine per week    Comment: 1 glass of wine or beer 1-2 times per week   Drug use: Never   Sexual activity: Not Currently  Other Topics Concern   Not on file  Social History Narrative   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   1 child living and 1 deceased son [name])   Social Drivers of Corporate investment banker Strain: Low Risk  (07/28/2023)   Overall Financial Resource Strain (CARDIA)    Difficulty of Paying Living Expenses: Not hard at all  Food Insecurity: No Food Insecurity (07/28/2023)   Hunger Vital Sign    Worried About Running Out of Food in the Last Year: Never true    Ran Out of Food in the Last Year: Never true   Transportation Needs: No Transportation Needs (07/28/2023)   PRAPARE - Administrator, Civil Service (Medical): No    Lack of Transportation (Non-Medical): No  Physical Activity: Inactive (07/28/2023)   Exercise Vital Sign    Days of Exercise per Week: 0 days    Minutes of Exercise per Session: 0 min  Stress: No Stress Concern Present (07/28/2023)   [employer] of Occupational Health - Occupational Stress Questionnaire    Feeling of Stress :  Only a little  Social Connections: Socially Isolated (07/28/2023)   Social Connection and Isolation Panel [NHANES]    Frequency of Communication with Friends and Family: More than three times a week    Frequency of Social Gatherings with Friends and Family: More than three times a week    Attends Religious Services: Never    Database administrator or Organizations: No    Attends Banker Meetings: Never    Marital Status: Widowed    Allergies:  Allergies  Allergen Reactions   Penicillins Hives   Peanut-Containing Drug Products Itching and Rash    Peanuts    Metabolic Disorder Labs: Lab Results  Component Value Date   HGBA1C 6.6 (H) 07/14/2023   No results found for: "PROLACTIN" Lab Results  Component Value Date   CHOL 147 07/14/2023   TRIG 83 07/14/2023   HDL 48 07/14/2023   VLDL 16 02/28/2019   LDLCALC 83 07/14/2023   LDLCALC 54 12/31/2022   Lab Results  Component Value Date   TSH 1.230 05/29/2023   TSH 0.940 07/09/2022    Therapeutic Level Labs: No results found for: "LITHIUM" No results found for: "VALPROATE" No results found for: "CBMZ"  Current Medications: Current Outpatient Medications  Medication Sig Dispense Refill   acetaminophen (TYLENOL) 500 MG tablet Take 2 tablets (1,000 mg total) by mouth every 6 (six) hours as needed for mild pain.     atorvastatin (LIPITOR) 10 MG tablet TAKE 1 TABLET(10 MG) BY MOUTH DAILY 90 tablet 4   meclizine (ANTIVERT) 12.5 MG tablet Take 1 tablet (12.5 mg  total) by mouth 3 (three) times daily as needed for dizziness. 30 tablet 5   [START ON 10/25/2023] sertraline (ZOLOFT) 100 MG tablet Take 1 tablet (100 mg total) by mouth at bedtime. 30 tablet 1   [START ON 10/16/2023] traZODone (DESYREL) 150 MG tablet Take 1 tablet (150 mg total) by mouth at bedtime as needed for sleep. 30 tablet 3   No current facility-administered medications for this visit.     Musculoskeletal: Strength & Muscle Tone:  N/A Gait & Station:  N/A Patient leans: N/A  Psychiatric Specialty Exam: Review of Systems  Psychiatric/Behavioral:  Positive for sleep disturbance. Negative for agitation, behavioral problems, confusion, decreased concentration, dysphoric mood, hallucinations, self-injury and suicidal ideas. The patient is nervous/anxious. The patient is not hyperactive.   All other systems reviewed and are negative.   There were no vitals taken for this visit.There is no height or weight on file to calculate BMI.  General Appearance: Well Groomed  Eye Contact:  Good  Speech:  Clear and Coherent  Volume:  Normal  Mood:   nervous  Affect:  Appropriate, Congruent, and Full Range  Thought Process:  Coherent  Orientation:  Full (Time, Place, and Person)  Thought Content: Logical   Suicidal Thoughts:  No  Homicidal Thoughts:  No  Memory:  Immediate;   Good  Judgement:  Good  Insight:  Good  Psychomotor Activity:  Normal  Concentration:  Concentration: Good and Attention Span: Good  Recall:  Good  Fund of Knowledge: Good  Language: Good  Akathisia:  No  Handed:  Right  AIMS (if indicated): not done  Assets:  Communication Skills Desire for Improvement  ADL's:  Intact  Cognition: WNL  Sleep:  Poor   Screenings: AUDIT    Flowsheet Row Clinical Support from 06/11/2022 in [facility]  Alcohol Use Disorder Identification Test Final Score (AUDIT) 8  GAD-7    Flowsheet Row Office Visit from 07/14/2023 in [facility] Office Visit from 05/29/2023 in [facility] Office Visit from 03/03/2023 in [facility] Office Visit from 02/03/2023 in [facility] Office Visit from 01/13/2023 in [facility]  Total GAD-7 Score 2 6 12 6 13       PHQ2-9    Flowsheet Row Clinical Support from 07/28/2023 in [facility] Office Visit from 07/14/2023 in [facility] Patient Outreach from 07/06/2023 in [place] [facility] Office Visit from 05/29/2023 in [facility] Patient Outreach from 05/04/2023 in [place] [facility]PART[facility]  PHQ-2 Total Score 0 0 0 3 0  PHQ-9 Total Score 0 6 -- 11 --      Flowsheet Row Pre-Admission Testing 45 from 04/02/2022 in [facility] PRE ADMISSION TESTING  C-SSRS RISK CATEGORY No Risk        Assessment and Plan:  ALVERA TOURIGNY is a 68 y.o. year old female with a history of depression,s/p hysterectomy, , who is referred for depression.     1. MDD (major depressive disorder), recurrent, in partial remission (HCC) Acute stressors include: conflict with her sisters over her mother's will, attempted break in to her car Other stressors include: loss of her husband from [place] in 2021, loss of her son (transferred from [place]) in 2000 at age 46, financial strain,     History: transferred from [facility], was on fluoxetine 40 mg BID, Abilify 5 mg daily, lorazepam 1 mg twice a day- ran out a week prior to the visit Although she reports frustration about her neighbors and experiences and some anxiety, she denies any depressive symptoms since the last visit.  Will continue current dose of sertraline to target depression.   2. Insomnia, unspecified type - +snoring. Unable to proceed with HST due to financial strain - drinks coffee two cups per day every  other day  Slightly worsening in the context of conflict with her neighbor.  Will continue current dose of trazodone as needed for insomnia for now.    # history of alcohol use She reports a history of alcohol use when her husband was alive, who also struggled with alcohol-related issues. We will continue to assess this.    Plan Continue sertraline 100 mg at night  Continue trazodone 150 mg at night as needed for insomnia Next appointment: 4/9 at 4 30, video   Past trials of medication: fluoxetine, Abilify, lorazepam     The patient demonstrates the following risk factors for suicide: Chronic risk factors for suicide include: psychiatric disorder of depression . Acute risk factors for suicide include: loss (financial, interpersonal, professional). Protective factors for this patient include: positive social support, responsibility to others (children, family), coping skills, and hope for the future. Considering these factors, the overall suicide risk at this point appears to be low. Patient is appropriate for outpatient follow up.     Collaboration of Care: Collaboration of Care: Other reviewed notes in Epic  Patient/Guardian was advised Release of Information must be obtained prior to any record release in order to collaborate their care with an outside provider. Patient/Guardian was advised if they have not already done so to contact the registration department to sign all necessary forms in order for [place] to release information regarding their care.   Consent: Patient/Guardian gives verbal consent for  treatment and assignment of benefits for services provided during this visit. Patient/Guardian expressed understanding and agreed to proceed.    [name], MD 10/07/2023, 4:59 PM

## 2023-10-07 ENCOUNTER — Telehealth (INDEPENDENT_AMBULATORY_CARE_PROVIDER_SITE_OTHER): Payer: Medicare Other | Admitting: Psychiatry

## 2023-10-07 ENCOUNTER — Encounter: Payer: Self-pay | Admitting: Psychiatry

## 2023-10-07 DIAGNOSIS — G4700 Insomnia, unspecified: Secondary | ICD-10-CM

## 2023-10-07 DIAGNOSIS — F3341 Major depressive disorder, recurrent, in partial remission: Secondary | ICD-10-CM

## 2023-10-07 MED ORDER — TRAZODONE HCL 150 MG PO TABS: 150.0000 mg | ORAL_TABLET | Freq: Every evening | ORAL | 3 refills | Status: DC | PRN

## 2023-10-07 MED ORDER — SERTRALINE HCL 100 MG PO TABS: 100.0000 mg | ORAL_TABLET | Freq: Every day | ORAL | 1 refills | Status: DC

## 2023-10-07 NOTE — Patient Instructions (Signed)
Continue sertraline 100 mg at night  Continue trazodone 150 mg at night as needed for insomnia Next appointment: 4/9 at 4 30

## 2023-10-11 NOTE — Patient Instructions (Signed)

## 2023-10-16 ENCOUNTER — Ambulatory Visit (INDEPENDENT_AMBULATORY_CARE_PROVIDER_SITE_OTHER): Payer: Medicare Other | Admitting: Nurse Practitioner

## 2023-10-16 ENCOUNTER — Encounter: Payer: Self-pay | Admitting: Nurse Practitioner

## 2023-10-16 VITALS — BP 99/63 | HR 91 | Temp 98.6°F | Ht 65.0 in | Wt 193.4 lb

## 2023-10-16 DIAGNOSIS — F331 Major depressive disorder, recurrent, moderate: Secondary | ICD-10-CM

## 2023-10-16 DIAGNOSIS — E782 Mixed hyperlipidemia: Secondary | ICD-10-CM | POA: Diagnosis not present

## 2023-10-16 DIAGNOSIS — E6609 Other obesity due to excess calories: Secondary | ICD-10-CM

## 2023-10-16 DIAGNOSIS — J432 Centrilobular emphysema: Secondary | ICD-10-CM

## 2023-10-16 DIAGNOSIS — Z6831 Body mass index (BMI) 31.0-31.9, adult: Secondary | ICD-10-CM

## 2023-10-16 DIAGNOSIS — E66811 Obesity, class 1: Secondary | ICD-10-CM

## 2023-10-16 DIAGNOSIS — R7301 Impaired fasting glucose: Secondary | ICD-10-CM

## 2023-10-16 DIAGNOSIS — F17210 Nicotine dependence, cigarettes, uncomplicated: Secondary | ICD-10-CM

## 2023-10-16 NOTE — Assessment & Plan Note (Signed)
BMI 32.18.  Recommended eating smaller high protein, low fat meals more frequently and exercising 30 mins a day 5 times a week with a goal of 10-15lb weight loss in the next 3 months. Patient voiced their understanding and motivation to adhere to these recommendations.

## 2023-10-16 NOTE — Assessment & Plan Note (Signed)
 Chronic, ongoing.  We will trial her off medication and recheck labs next visit.  Discussed with her today.  Recommend cut back on red meat, cheese, processed foods.  Continue not to smoke.

## 2023-10-16 NOTE — Progress Notes (Signed)
 BP 99/63   Pulse 91   Temp 98.6 F (37 C) (Oral)   Ht 5\' 5"  (1.651 m)   Wt 193 lb 6.4 oz (87.7 kg)   SpO2 98%   BMI 32.18 kg/m    Subjective:    Patient ID: Heather Hill, female    DOB: [birth year], 68 y.o.   MRN: [id number]  HPI: Heather Hill is a 68 y.o. female  Chief Complaint  Patient presents with   Diabetes   Impaired Fasting Glucose HbA1C:  Lab Results  Component Value Date   HGBA1C 6.6 (H) 07/14/2023  Duration of elevated blood sugar: months Polydipsia: no Polyuria: no Weight change: no Visual disturbance: no Glucose Monitoring: no    Accucheck frequency: Not Checking    Fasting glucose:     Post prandial:  Diabetic Education: Not Completed Family history of diabetes: yes mother  HYPERLIPIDEMIA Continues on Atorvastatin daily.  She would like to come off this due to muscle aches. Hyperlipidemia status: good compliance Satisfied with current treatment?  yes Side effects:  no Medication compliance: good compliance Supplements: none Aspirin:  no The 10-year ASCVD risk score (Arnett DK, et al., 2019) is: 4.1%   Values used to calculate the score:     Age: 76 years     Sex: Female     Is Non-Hispanic African American: Yes     Diabetic: No     Tobacco smoker: No     Systolic Blood Pressure: 99 mmHg     Is BP treated: No     HDL Cholesterol: 48 mg/dL     Total Cholesterol: 147 mg/dL Chest pain:  no Coronary artery disease:  no Family history CAD:  yes Family history early CAD:  no   Relevant past medical, surgical, family and social history reviewed and updated as indicated. Interim medical history since our last visit reviewed. Allergies and medications reviewed and updated.  Review of Systems  Constitutional:  Negative for activity change, appetite change, diaphoresis, fatigue and fever.  Respiratory:  Negative for cough, chest tightness and shortness of breath.   Cardiovascular:  Negative for chest pain, palpitations and leg swelling.   Gastrointestinal: Negative.   Endocrine: Negative for polydipsia, polyphagia and polyuria.  Neurological: Negative.   Psychiatric/Behavioral: Negative.     Per HPI unless specifically indicated above     Objective:    BP 99/63   Pulse 91   Temp 98.6 F (37 C) (Oral)   Ht 5\' 5"  (1.651 m)   Wt 193 lb 6.4 oz (87.7 kg)   SpO2 98%   BMI 32.18 kg/m   Wt Readings from Last 3 Encounters:  10/16/23 193 lb 6.4 oz (87.7 kg)  07/28/23 183 lb (83 kg)  07/14/23 187 lb 6.4 oz (85 kg)    Physical Exam Vitals and nursing note reviewed.  Constitutional:      General: She is awake. She is not in acute distress.    Appearance: She is well-developed and well-groomed. She is obese. She is not ill-appearing or toxic-appearing.  HENT:     Head: Normocephalic.     Right Ear: Hearing and external ear normal.     Left Ear: Hearing and external ear normal.  Eyes:     General: Lids are normal.        Right eye: No discharge.        Left eye: No discharge.     Conjunctiva/sclera: Conjunctivae normal.     Pupils: Pupils are equal,  round, and reactive to light.  Neck:     Thyroid: No thyromegaly.     Vascular: No carotid bruit.  Cardiovascular:     Rate and Rhythm: Normal rate and regular rhythm.     Heart sounds: Normal heart sounds. No murmur heard.    No gallop.  Pulmonary:     Effort: Pulmonary effort is normal. No accessory muscle usage or respiratory distress.     Breath sounds: Normal breath sounds.  Abdominal:     General: Bowel sounds are normal. There is no distension.     Palpations: Abdomen is soft.     Tenderness: There is no abdominal tenderness.  Musculoskeletal:     Cervical back: Normal range of motion and neck supple.     Right lower leg: No edema.     Left lower leg: No edema.  Lymphadenopathy:     Cervical: No cervical adenopathy.  Skin:    General: Skin is warm and dry.  Neurological:     Mental Status: She is alert and oriented to person, place, and time.      Deep Tendon Reflexes: Reflexes are normal and symmetric.     Reflex Scores:      Brachioradialis reflexes are 2+ on the right side and 2+ on the left side.      Patellar reflexes are 2+ on the right side and 2+ on the left side. Psychiatric:        Attention and Perception: Attention normal.        Mood and Affect: Mood normal.        Speech: Speech normal.        Behavior: Behavior normal. Behavior is cooperative.        Thought Content: Thought content normal.    Results for orders placed or performed in visit on 07/14/23  Comprehensive metabolic panel   Collection Time: 07/14/23 10:39 AM  Result Value Ref Range   Glucose 97 70 - 99 mg/dL   BUN 13 8 - 27 mg/dL   Creatinine, Ser 3.08 0.57 - 1.00 mg/dL   eGFR 72 >65 HQ/ION/6.29   BUN/Creatinine Ratio 15 12 - 28   Sodium 143 134 - 144 mmol/L   Potassium 4.2 3.5 - 5.2 mmol/L   Chloride 103 96 - 106 mmol/L   CO2 25 20 - 29 mmol/L   Calcium 9.1 8.7 - 10.3 mg/dL   Total Protein 7.6 6.0 - 8.5 g/dL   Albumin 4.3 3.9 - 4.9 g/dL   Globulin, Total 3.3 1.5 - 4.5 g/dL   Bilirubin Total 0.3 0.0 - 1.2 mg/dL   Alkaline Phosphatase 104 44 - 121 IU/L   AST 14 0 - 40 IU/L   ALT 12 0 - 32 IU/L  CBC with Differential/Platelet   Collection Time: 07/14/23 10:39 AM  Result Value Ref Range   WBC 5.7 3.4 - 10.8 x10E3/uL   RBC 5.04 3.77 - 5.28 x10E6/uL   Hemoglobin 13.3 11.1 - 15.9 g/dL   Hematocrit 52.8 41.3 - 46.6 %   MCV 85 79 - 97 fL   MCH 26.4 (L) 26.6 - 33.0 pg   MCHC 31.2 (L) 31.5 - 35.7 g/dL   RDW 24.4 01.0 - 27.2 %   Platelets 281 150 - 450 x10E3/uL   Neutrophils 46 Not Estab. %   Lymphs 41 Not Estab. %   Monocytes 8 Not Estab. %   Eos 4 Not Estab. %   Basos 1 Not Estab. %   Neutrophils Absolute 2.7  1.4 - 7.0 x10E3/uL   Lymphocytes Absolute 2.3 0.7 - 3.1 x10E3/uL   Monocytes Absolute 0.5 0.1 - 0.9 x10E3/uL   EOS (ABSOLUTE) 0.2 0.0 - 0.4 x10E3/uL   Basophils Absolute 0.1 0.0 - 0.2 x10E3/uL   Immature Granulocytes 0 Not Estab. %    Immature Grans (Abs) 0.0 0.0 - 0.1 x10E3/uL  VITAMIN D 25 Hydroxy (Vit-D Deficiency, Fractures)   Collection Time: 07/14/23 10:39 AM  Result Value Ref Range   Vit D, 25-Hydroxy 37.0 30.0 - 100.0 ng/mL  Lipid Panel w/o Chol/HDL Ratio   Collection Time: 07/14/23 10:39 AM  Result Value Ref Range   Cholesterol, Total 147 100 - 199 mg/dL   Triglycerides 83 0 - 149 mg/dL   HDL 48 >16 mg/dL   VLDL Cholesterol Cal 16 5 - 40 mg/dL   LDL Chol Calc (NIH) 83 0 - 99 mg/dL  HgB X0R   Collection Time: 07/14/23 10:39 AM  Result Value Ref Range   Hgb A1c MFr Bld 6.6 (H) 4.8 - 5.6 %   Est. average glucose Bld gHb Est-mCnc 143 mg/dL      Assessment & Plan:   Problem List Items Addressed This Visit       Endocrine   IFG (impaired fasting glucose) - Primary   Ongoing.  6.6% A1c recent labs and did take Abilify for mood at time, will check A1c today.  Recommend ongoing diet focus.  We discussed plan of care.      Relevant Orders   Comprehensive metabolic panel   HgB A1c   Urine Microalbumin w/creat. ratio     Other   Hyperlipidemia   Chronic, ongoing.  We will trial her off medication and recheck labs next visit.  Discussed with her today.  Recommend cut back on red meat, cheese, processed foods.  Continue not to smoke.      Obesity   BMI 32.18.  Recommended eating smaller high protein, low fat meals more frequently and exercising 30 mins a day 5 times a week with a goal of 10-15lb weight loss in the next 3 months. Patient voiced their understanding and motivation to adhere to these recommendations.         Follow up plan: Return for as scheduled in May .

## 2023-10-16 NOTE — Assessment & Plan Note (Addendum)
 Ongoing.  6.6% A1c recent labs and did take Abilify for mood at time, will check A1c today.  Recommend ongoing diet focus.  We discussed plan of care.

## 2023-10-17 ENCOUNTER — Encounter: Payer: Self-pay | Admitting: Nurse Practitioner

## 2023-10-17 LAB — COMPREHENSIVE METABOLIC PANEL
ALT: 15 IU/L (ref 0–32)
AST: 15 IU/L (ref 0–40)
Albumin: 4.1 g/dL (ref 3.9–4.9)
Alkaline Phosphatase: 94 IU/L (ref 44–121)
BUN/Creatinine Ratio: 17 (ref 12–28)
BUN: 15 mg/dL (ref 8–27)
Bilirubin Total: <0.2 mg/dL (ref 0.0–1.2)
CO2: 24 mmol/L (ref 20–29)
Calcium: 9.5 mg/dL (ref 8.7–10.3)
Chloride: 105 mmol/L (ref 96–106)
Creatinine, Ser: 0.89 mg/dL (ref 0.57–1.00)
Globulin, Total: 2.9 g/dL (ref 1.5–4.5)
Glucose: 122 mg/dL — ABNORMAL HIGH (ref 70–99)
Potassium: 4.1 mmol/L (ref 3.5–5.2)
Sodium: 144 mmol/L (ref 134–144)
Total Protein: 7 g/dL (ref 6.0–8.5)
eGFR: 71 mL/min/{1.73_m2} (ref >59)

## 2023-10-17 LAB — HEMOGLOBIN A1C
Est. average glucose Bld gHb Est-mCnc: 137 mg/dL
Hgb A1c MFr Bld: 6.4 % — ABNORMAL HIGH (ref 4.8–5.6)

## 2023-10-17 NOTE — Progress Notes (Signed)
 Good morning crew, Heather Hill prefers a phone call, please call with following: Good day Heather Hill, your labs have returned and kidney and liver function are normal.  A1c, diabetes testing, is 6.4%.  This is a trend down from 6.6%.  Remember how we discussed any number 6.5% or greater is diabetes and any number 5.7 to 6.4% is considered prediabetes.  You are in prediabetes level.  Highly recommend focus on diet changes with less sugar and carbohydrates.  We will recheck at next visit.  Any questions? Keep being amazing!!  Thank you for allowing me to participate in your care.  I appreciate you. Kindest regards, Rindy Kollman

## 2023-10-18 LAB — MICROALBUMIN / CREATININE URINE RATIO
Creatinine, Urine: 102.6 mg/dL
Microalb/Creat Ratio: 26 mg/g{creat} (ref 0–29)
Microalbumin, Urine: 27 ug/mL

## 2023-10-19 ENCOUNTER — Ambulatory Visit: Payer: Self-pay | Admitting: *Deleted

## 2023-10-19 NOTE — Patient Outreach (Signed)
 Care Coordination   Follow Up Visit Note   10/19/2023 Name: Heather Hill MRN: [id number] DOB: 1956-05-29  [name] is a 68 y.o. year old female who sees [place], Corrie Dandy T, NP for primary care. I spoke with  [name] by phone today.  What matters to the patients health and wellness today?  Patient report doing well, happy to be off statin, will continue to monitor muscle and arthritis pain to make sure it resolves. Denies any urgent concerns, encouraged to contact this care manager with questions.     Goals Addressed             This Visit's Progress    COMPLETED: Care Coordination activites       Interventions Today    Flowsheet Row Most Recent Value  Chronic Disease   Chronic disease during today's visit Chronic Obstructive Pulmonary Disease (COPD), Other  [HLD]  General Interventions   General Interventions Discussed/Reviewed General Interventions Reviewed, Doctor Visits  Doctor Visits Discussed/Reviewed Doctor Visits Reviewed, PCP  [Routine PCP follow up on 5/19]  PCP/Specialist Visits Compliance with follow-up visit  [completed PCP visit last week]  Exercise Interventions   Exercise Discussed/Reviewed Physical Activity  Physical Activity Discussed/Reviewed Physical Activity Reviewed  [Encouraged to increase activity in effort to continue keeping chronic conditions controlled]  Education Interventions   Education Provided Provided Education  Provided Verbal Education On Medication, When to see the doctor, Exercise, Nutrition  [Medications reviewed, stopped taking statin due to arthritic/muscle pain.  Will keep cholesterol numbers assessed]  Nutrition Interventions   Nutrition Discussed/Reviewed Nutrition Reviewed, Decreasing fats, Adding fruits and vegetables, Decreasing salt  [Discussed proper diet for HLD, given patient no longer taking statin]              SDOH assessments and interventions completed:  No     Care Coordination Interventions:   Yes, provided   Follow up plan: No further intervention required.   Encounter Outcome:  Patient Visit Completed   Rodney Langton, RN, MSN, CCM [place]  [facility], [facility] RN Care Coordinator Direct Dial: [phone] / Main [phone] Fax [phone] Email: [email or web address]

## 2023-10-19 NOTE — Patient Instructions (Signed)
 Visit Information  Thank you for taking time to visit with me today. Please don't hesitate to contact me if I can be of assistance to you before our next scheduled telephone appointment.  Following are the goals we discussed today:  Read information attached on how to keep cholesterol managed.   Please call the Suicide and Crisis Lifeline: 988 call the [place] National Suicide Prevention Lifeline: 787-287-1582 or TTY: 641 872 2542 TTY (225)271-4147) to talk to a trained counselor call 1-800-273-TALK (toll free, 24 hour hotline) call 911 if you are experiencing a Mental Health or Behavioral Health Crisis or need someone to talk to.  Patient verbalizes understanding of instructions and care plan provided today and agrees to view in MyChart. Active MyChart status and patient understanding of how to access instructions and care plan via MyChart confirmed with patient.     The patient has been provided with contact information for the care management team and has been advised to call with any health related questions or concerns.   Rodney Langton, RN, MSN, CCM [facility], [facility] RN Care Coordinator Direct Dial: [phone] / Main [phone] Fax [phone] Email: [email or web address]: [email or web address]
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
## 2023-12-02 ENCOUNTER — Encounter: Payer: Self-pay | Admitting: Psychiatry

## 2023-12-02 ENCOUNTER — Telehealth (INDEPENDENT_AMBULATORY_CARE_PROVIDER_SITE_OTHER): Payer: Self-pay | Admitting: Psychiatry

## 2023-12-02 DIAGNOSIS — G4700 Insomnia, unspecified: Secondary | ICD-10-CM

## 2023-12-02 DIAGNOSIS — F3341 Major depressive disorder, recurrent, in partial remission: Secondary | ICD-10-CM

## 2023-12-02 MED ORDER — SERTRALINE HCL 100 MG PO TABS
100.0000 mg | ORAL_TABLET | Freq: Every day | ORAL | 3 refills | Status: DC
Start: 2023-12-24 — End: 2024-03-23

## 2023-12-02 NOTE — Patient Instructions (Signed)
 Continue sertraline 100 mg at night  Continue trazodone 150 mg at night as needed for insomnia Next appointment: 5/28 at 4 30

## 2023-12-04 ENCOUNTER — Encounter: Payer: Self-pay | Admitting: Nurse Practitioner

## 2023-12-04 ENCOUNTER — Ambulatory Visit (INDEPENDENT_AMBULATORY_CARE_PROVIDER_SITE_OTHER): Admitting: Nurse Practitioner

## 2023-12-04 VITALS — BP 101/67 | HR 73 | Temp 97.9°F | Ht 65.0 in | Wt 198.6 lb

## 2023-12-04 DIAGNOSIS — M722 Plantar fascial fibromatosis: Secondary | ICD-10-CM | POA: Insufficient documentation

## 2023-12-04 NOTE — Assessment & Plan Note (Signed)
 Acute for 3 weeks to left foot only at this time.  Recommend starting simple treatment: frozen water bottle, tennis ball with frozen water inside, or lacrosse ball to roll along tendon frequently during the day.  Showed her example of this and provided instructions.  Wear heel support, showed her examples she could obtain from drugstore or online.  May use Tylenol as needed + Voltaren gel or Icy/Hot OTC.  Rest foot often.  Return in 2 weeks and if not improved will consider podiatry. Discussed with her.

## 2023-12-04 NOTE — Patient Instructions (Signed)
 Over The Counter Voltaren Gel - Get a tennis ball, poke hole in it and fill with water, placed in freezer so it freezes solid, once frozen massage heel with this - Can use Lacrosse ball to massage heel - Wear a heel support  Plantar Fasciitis: What to Know  Your plantar fascia is a band of thick tissue on the bottom of your foot. It connects your heel bone to the base of your toes. If the fascia gets irritated, it can cause pain in your heel or foot. This is called plantar fasciitis. In some cases, plantar fasciitis can make it hard for you to walk or move. The pain is often worse in the morning after sleeping, or after sitting or lying down for a long time. Pain may also be worse after walking or standing for a long time. What are the causes? Plantar fasciitis may be caused by: Standing for a long time. Wearing shoes that don't have good arch support. Doing high-impact activities. These are things that put stress on your joints. They include: Ballet. Aerobic exercises. These are exercises that make your heart beat faster. Being overweight. Having a way of walking, or gait, that isn't normal. Tight muscles in your calf, which is in the back of your lower leg. High arches in your feet, or flat feet. Starting a new sport or activity. What are the signs or symptoms? The main symptom of plantar fasciitis is heel pain. Your pain may get worse after: You take your first steps after a time of rest. This includes in the morning after you wake up, or after you've been sitting or lying down for a while. Standing still for a long time. Pain may lessen after 30-45 minutes of activity, such as gentle walking. How is this diagnosed? Plantar fasciitis may be diagnosed based on your medical history, your symptoms, and an exam. Your health care provider will check for: A tender spot on the bottom of your foot. A high arch in your foot, or flat feet. Pain when you move your foot. Trouble moving your  foot. You may also have tests. These may include: X-rays. Ultrasound. MRI. How is this treated? Treatment depends on how bad your plantar fasciitis is. It may include: RICE therapy. This stands for rest, ice, pressure (compression), and raising (elevating) the foot. Exercises to stretch your calves and plantar fascia. A night splint. This holds your foot in a stretched, upward position while you sleep. Physical therapy. This can help with symptoms. It can also prevent problems in the future. Shots of a steroid medicine called cortisone. This can help with pain and irritation. Extracorporeal shock wave therapy. This uses electric shocks to stimulate your plantar fascia. If other treatments don't help, you may need to have surgery. Follow these instructions at home: Managing pain, stiffness, and swelling  Use ice, an ice pack, or a frozen bottle of water as told. Place a towel between your skin and the ice. Roll the bottom of your foot over the ice or frozen bottle. Do this for 20 minutes, 2-3 times a day. If your skin turns red, take off the ice right away to prevent skin damage. The risk of damage is higher if you can't feel pain, heat, or cold. Wear shoes that have air-sole or gel-sole cushions. You could also try soft shoe inserts made for plantar fasciitis. Activity Try not to do things that cause pain. Ask what things are safe for you to do. Exercise as told. Try activities that  are low impact. This means that they're easier on your joints. They include: Swimming. Water aerobics. Biking. General instructions Take your medicines only as told. Wear a night splint as told. Loosen the splint if your toes tingle, are numb, or turn cold and blue. Stay at a healthy weight. Work with your provider to lose weight as needed. Contact a health care provider if: Your symptoms don't go away with treatment. You have pain that gets worse. Your pain makes it hard to move or do everyday  things. This information is not intended to replace advice given to you by your health care provider. Make sure you discuss any questions you have with your health care provider. Document Revised: 01/12/2023 Document Reviewed: 01/12/2023 Elsevier Patient Education  2024 [employer].

## 2023-12-04 NOTE — Progress Notes (Signed)
 BP 101/67   Pulse 73   Temp 97.9 F (36.6 C) (Oral)   Ht 5\' 5"  (1.651 m)   Wt 198 lb 9.6 oz (90.1 kg)   SpO2 98%   BMI 33.05 kg/m    Subjective:    Patient ID: Heather Hill, female    DOB: 1955/11/27, 68 y.o.   MRN: [id number]  HPI: Heather Hill is a 68 y.o. female  Chief Complaint  Patient presents with   Foot Pain    Patient states she has been having L foot pain for the last 3 weeks. States pain has gradually gotten worse, no known injuries per patient. States she has the sensation of someone poking her foot all the time.    FOOT PAIN Has had left foot pain for 3 weeks, no recent falls or injuries. Sudden onset. 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  When gets up in morning and gets out of bed pain is worse. Duration: weeks Involved foot: left Mechanism of injury: unknown Location: mid foot to heel plantar aspect Onset: sudden  Severity: 10/10  Quality:  sharp, aching, and throbbing Frequency: constant Radiation: no Aggravating factors: weight bearing, walking, and movement  Alleviating factors: resting and propping foot up  Status: worse Treatments attempted: rest/propping, Tylenol (eased pain), Vicks Rub  Relief with NSAIDs?:  No NSAIDs Taken Weakness with weight bearing or walking: yes Morning stiffness: yes Swelling: no Redness: no Bruising: no Paresthesias / decreased sensation: no  Fevers:no   Relevant past medical, surgical, family and social history reviewed and updated as indicated. Interim medical history since our last visit reviewed. Allergies and medications reviewed and updated.  Review of Systems  Constitutional:  Negative for activity change, appetite change, diaphoresis, fatigue and fever.  Respiratory:  Negative for cough, chest tightness and shortness of breath.   Cardiovascular:  Negative for chest pain, palpitations and leg swelling.  Gastrointestinal: Negative.   Musculoskeletal:  Positive for arthralgias.   Neurological: Negative.   Psychiatric/Behavioral: Negative.     Per HPI unless specifically indicated above     Objective:    BP 101/67   Pulse 73   Temp 97.9 F (36.6 C) (Oral)   Ht 5\' 5"  (1.651 m)   Wt 198 lb 9.6 oz (90.1 kg)   SpO2 98%   BMI 33.05 kg/m   Wt Readings from Last 3 Encounters:  12/04/23 198 lb 9.6 oz (90.1 kg)  10/16/23 193 lb 6.4 oz (87.7 kg)  07/28/23 183 lb (83 kg)    Physical Exam Vitals and nursing note reviewed.  Constitutional:      General: She is awake. She is not in acute distress.    Appearance: She is well-developed and well-groomed. She is obese. She is not ill-appearing or toxic-appearing.  HENT:     Head: Normocephalic.     Right Ear: Hearing and external ear normal.     Left Ear: Hearing and external ear normal.  Eyes:     General: Lids are normal.        Right eye: No discharge.        Left eye: No discharge.     Conjunctiva/sclera: Conjunctivae normal.     Pupils: Pupils are equal, round, and reactive to light.  Neck:     Thyroid: No thyromegaly.     Vascular: No carotid bruit.  Cardiovascular:     Rate and Rhythm: Normal rate and regular rhythm.     Pulses:  Dorsalis pedis pulses are 2+ on the right side and 2+ on the left side.       Posterior tibial pulses are 2+ on the right side and 2+ on the left side.     Heart sounds: Normal heart sounds. No murmur heard.    No gallop.  Pulmonary:     Effort: Pulmonary effort is normal. No accessory muscle usage or respiratory distress.     Breath sounds: Normal breath sounds.  Abdominal:     General: Bowel sounds are normal. There is no distension.     Palpations: Abdomen is soft.     Tenderness: There is no abdominal tenderness.  Musculoskeletal:     Cervical back: Normal range of motion and neck supple.     Right lower leg: No edema.     Left lower leg: No edema.     Right foot: Normal range of motion. No swelling, tenderness or crepitus. Normal pulse.     Left foot:  Normal range of motion. Tenderness (tenderness along plantar fascia, mid plantar aspect foot to heel) present. No swelling or crepitus. Normal pulse.  Feet:     Right foot:     Protective Sensation: 10 sites tested.  10 sites sensed.     Skin integrity: Dry skin present.     Toenail Condition: Right toenails are normal.     Left foot:     Protective Sensation: 10 sites tested.  10 sites sensed.     Skin integrity: Dry skin present.     Toenail Condition: Left toenails are normal.  Lymphadenopathy:     Cervical: No cervical adenopathy.  Skin:    General: Skin is warm and dry.  Neurological:     Mental Status: She is alert and oriented to person, place, and time.     Deep Tendon Reflexes: Reflexes are normal and symmetric.     Reflex Scores:      Brachioradialis reflexes are 2+ on the right side and 2+ on the left side.      Patellar reflexes are 2+ on the right side and 2+ on the left side. Psychiatric:        Attention and Perception: Attention normal.        Mood and Affect: Mood normal.        Speech: Speech normal.        Behavior: Behavior normal. Behavior is cooperative.        Thought Content: Thought content normal.     Results for orders placed or performed in visit on 10/16/23  Urine Microalbumin w/creat. ratio   Collection Time: 10/16/23  4:11 PM  Result Value Ref Range   Creatinine, Urine 102.6 Not Estab. mg/dL   Microalbumin, Urine 81.1 Not Estab. ug/mL   Microalb/Creat Ratio 26 0 - 29 mg/g creat  Comprehensive metabolic panel   Collection Time: 10/16/23  4:12 PM  Result Value Ref Range   Glucose 122 (H) 70 - 99 mg/dL   BUN 15 8 - 27 mg/dL   Creatinine, Ser 9.14 0.57 - 1.00 mg/dL   eGFR 71 >78 GN/FAO/1.30   BUN/Creatinine Ratio 17 12 - 28   Sodium 144 134 - 144 mmol/L   Potassium 4.1 3.5 - 5.2 mmol/L   Chloride 105 96 - 106 mmol/L   CO2 24 20 - 29 mmol/L   Calcium 9.5 8.7 - 10.3 mg/dL   Total Protein 7.0 6.0 - 8.5 g/dL   Albumin 4.1 3.9 - 4.9 g/dL  Globulin, Total 2.9 1.5 - 4.5 g/dL   Bilirubin Total <1.1 0.0 - 1.2 mg/dL   Alkaline Phosphatase 94 44 - 121 IU/L   AST 15 0 - 40 IU/L   ALT 15 0 - 32 IU/L  HgB A1c   Collection Time: 10/16/23  4:12 PM  Result Value Ref Range   Hgb A1c MFr Bld 6.4 (H) 4.8 - 5.6 %   Est. average glucose Bld gHb Est-mCnc 137 mg/dL      Assessment & Plan:   Problem List Items Addressed This Visit       Musculoskeletal and Integument   Plantar fasciitis - Primary   Acute for 3 weeks to left foot only at this time.  Recommend starting simple treatment: frozen water bottle, tennis ball with frozen water inside, or lacrosse ball to roll along tendon frequently during the day.  Showed her example of this and provided instructions.  Wear heel support, showed her examples she could obtain from drugstore or online.  May use Tylenol as needed + Voltaren gel or Icy/Hot OTC.  Rest foot often.  Return in 2 weeks and if not improved will consider podiatry. Discussed with her.        Follow up plan: Return in about 2 weeks (around 12/18/2023) for Plantar Fasicitis.

## 2023-12-19 NOTE — Patient Instructions (Signed)
 Plantar Fasciitis: What to Know  Your plantar fascia is a band of thick tissue on the bottom of your foot. It connects your heel bone to the base of your toes. If the fascia gets irritated, it can cause pain in your heel or foot. This is called plantar fasciitis. In some cases, plantar fasciitis can make it hard for you to walk or move. The pain is often worse in the morning after sleeping, or after sitting or lying down for a long time. Pain may also be worse after walking or standing for a long time. What are the causes? Plantar fasciitis may be caused by: Standing for a long time. Wearing shoes that don't have good arch support. Doing high-impact activities. These are things that put stress on your joints. They include: Ballet. Aerobic exercises. These are exercises that make your heart beat faster. Being overweight. Having a way of walking, or gait, that isn't normal. Tight muscles in your calf, which is in the back of your lower leg. High arches in your feet, or flat feet. Starting a new sport or activity. What are the signs or symptoms? The main symptom of plantar fasciitis is heel pain. Your pain may get worse after: You take your first steps after a time of rest. This includes in the morning after you wake up, or after you've been sitting or lying down for a while. Standing still for a long time. Pain may lessen after 30-45 minutes of activity, such as gentle walking. How is this diagnosed? Plantar fasciitis may be diagnosed based on your medical history, your symptoms, and an exam. Your health care provider will check for: A tender spot on the bottom of your foot. A high arch in your foot, or flat feet. Pain when you move your foot. Trouble moving your foot. You may also have tests. These may include: X-rays. Ultrasound. MRI. How is this treated? Treatment depends on how bad your plantar fasciitis is. It may include: RICE therapy. This stands for rest, ice, pressure  (compression), and raising (elevating) the foot. Exercises to stretch your calves and plantar fascia. A night splint. This holds your foot in a stretched, upward position while you sleep. Physical therapy. This can help with symptoms. It can also prevent problems in the future. Shots of a steroid medicine called cortisone. This can help with pain and irritation. Extracorporeal shock wave therapy. This uses electric shocks to stimulate your plantar fascia. If other treatments don't help, you may need to have surgery. Follow these instructions at home: Managing pain, stiffness, and swelling  Use ice, an ice pack, or a frozen bottle of water as told. Place a towel between your skin and the ice. Roll the bottom of your foot over the ice or frozen bottle. Do this for 20 minutes, 2-3 times a day. If your skin turns red, take off the ice right away to prevent skin damage. The risk of damage is higher if you can't feel pain, heat, or cold. Wear shoes that have air-sole or gel-sole cushions. You could also try soft shoe inserts made for plantar fasciitis. Activity Try not to do things that cause pain. Ask what things are safe for you to do. Exercise as told. Try activities that are low impact. This means that they're easier on your joints. They include: Swimming. Water aerobics. Biking. General instructions Take your medicines only as told. Wear a night splint as told. Loosen the splint if your toes tingle, are numb, or turn cold and blue.  Stay at a healthy weight. Work with your provider to lose weight as needed. Contact a health care provider if: Your symptoms don't go away with treatment. You have pain that gets worse. Your pain makes it hard to move or do everyday things. This information is not intended to replace advice given to you by your health care provider. Make sure you discuss any questions you have with your health care provider. Document Revised: 01/12/2023 Document Reviewed:  01/12/2023 Elsevier Patient Education  2024 [employer].

## 2023-12-21 ENCOUNTER — Encounter: Payer: Self-pay | Admitting: Nurse Practitioner

## 2023-12-21 ENCOUNTER — Ambulatory Visit (INDEPENDENT_AMBULATORY_CARE_PROVIDER_SITE_OTHER): Admitting: Nurse Practitioner

## 2023-12-21 VITALS — BP 106/70 | HR 76 | Temp 98.3°F | Ht 65.0 in | Wt 195.3 lb

## 2023-12-21 DIAGNOSIS — M722 Plantar fascial fibromatosis: Secondary | ICD-10-CM

## 2023-12-21 NOTE — Progress Notes (Signed)
 BP 106/70   Pulse 76   Temp 98.3 F (36.8 C) (Oral)   Ht 5\' 5"  (1.651 m)   Wt 195 lb 4.8 oz (88.6 kg)   SpO2 96%   BMI 32.50 kg/m    Subjective:    Patient ID: Heather Hill, female    DOB: 1956/02/06, 68 y.o.   MRN: [id number]  HPI: Heather Hill is a 68 y.o. female  Chief Complaint  Patient presents with   Plantar Fasciitis    2 week f/up- patient states her foot has not gotten better. States she is still having a lot of pains in her L foot.    FOOT PAIN Follow-up for left foot pain for 5 weeks now, no recent falls or injuries. Sudden onset. 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  When gets up in morning and gets out of bed pain is worse. Tried all simple treatments as recommended last visit with no benefit. Duration: weeks Involved foot: left Mechanism of injury: unknown Location: mid foot to heel plantar aspect Onset: sudden  Severity: 10/10 at worst Quality:  sharp, aching, and throbbing Frequency: constant burning Radiation: no Aggravating factors: weight bearing, walking, and movement  Alleviating factors: resting and propping foot up  Status: worse Treatments attempted: rest/propping, Tylenol , Vicks Rub, rolling ball on heel, Voltaren  Relief with NSAIDs?:  No NSAIDs Taken Weakness with weight bearing or walking: yes Morning stiffness: yes Swelling: no Redness: no Bruising: no Paresthesias / decreased sensation: no  Fevers:no   Relevant past medical, surgical, family and social history reviewed and updated as indicated. Interim medical history since our last visit reviewed. Allergies and medications reviewed and updated.  Review of Systems  Constitutional:  Negative for activity change, appetite change, diaphoresis, fatigue and fever.  Respiratory:  Negative for cough, chest tightness and shortness of breath.   Cardiovascular:  Negative for chest pain, palpitations and leg swelling.  Gastrointestinal: Negative.   Musculoskeletal:   Positive for arthralgias.  Neurological: Negative.   Psychiatric/Behavioral: Negative.     Per HPI unless specifically indicated above     Objective:    BP 106/70   Pulse 76   Temp 98.3 F (36.8 C) (Oral)   Ht 5\' 5"  (1.651 m)   Wt 195 lb 4.8 oz (88.6 kg)   SpO2 96%   BMI 32.50 kg/m   Wt Readings from Last 3 Encounters:  12/21/23 195 lb 4.8 oz (88.6 kg)  12/04/23 198 lb 9.6 oz (90.1 kg)  10/16/23 193 lb 6.4 oz (87.7 kg)    Physical Exam Vitals and nursing note reviewed.  Constitutional:      General: She is awake. She is not in acute distress.    Appearance: She is well-developed and well-groomed. She is obese. She is not ill-appearing or toxic-appearing.  HENT:     Head: Normocephalic.     Right Ear: Hearing and external ear normal.     Left Ear: Hearing and external ear normal.  Eyes:     General: Lids are normal.        Right eye: No discharge.        Left eye: No discharge.     Conjunctiva/sclera: Conjunctivae normal.     Pupils: Pupils are equal, round, and reactive to light.  Neck:     Thyroid : No thyromegaly.     Vascular: No carotid bruit.  Cardiovascular:     Rate and Rhythm: Normal rate and regular rhythm.  Pulses:          Dorsalis pedis pulses are 2+ on the right side and 2+ on the left side.       Posterior tibial pulses are 2+ on the right side and 2+ on the left side.     Heart sounds: Normal heart sounds. No murmur heard.    No gallop.  Pulmonary:     Effort: Pulmonary effort is normal. No accessory muscle usage or respiratory distress.     Breath sounds: Normal breath sounds.  Abdominal:     General: Bowel sounds are normal. There is no distension.     Palpations: Abdomen is soft.     Tenderness: There is no abdominal tenderness.  Musculoskeletal:     Cervical back: Normal range of motion and neck supple.     Right lower leg: No edema.     Left lower leg: No edema.     Right foot: Normal range of motion. No swelling, tenderness or  crepitus. Normal pulse.     Left foot: Normal range of motion. Tenderness (tenderness along plantar fascia, mid plantar aspect foot to heel) present. No swelling or crepitus. Normal pulse.  Feet:     Right foot:     Protective Sensation: 10 sites tested.  10 sites sensed.     Skin integrity: Dry skin present.     Toenail Condition: Right toenails are normal.     Left foot:     Protective Sensation: 10 sites tested.  10 sites sensed.     Skin integrity: Dry skin present.     Toenail Condition: Left toenails are normal.  Lymphadenopathy:     Cervical: No cervical adenopathy.  Skin:    General: Skin is warm and dry.  Neurological:     Mental Status: She is alert and oriented to person, place, and time.     Deep Tendon Reflexes: Reflexes are normal and symmetric.     Reflex Scores:      Brachioradialis reflexes are 2+ on the right side and 2+ on the left side.      Patellar reflexes are 2+ on the right side and 2+ on the left side. Psychiatric:        Attention and Perception: Attention normal.        Mood and Affect: Mood normal.        Speech: Speech normal.        Behavior: Behavior normal. Behavior is cooperative.        Thought Content: Thought content normal.    Results for orders placed or performed in visit on 10/16/23  Urine Microalbumin w/creat. ratio   Collection Time: 10/16/23  4:11 PM  Result Value Ref Range   Creatinine, Urine 102.6 Not Estab. mg/dL   Microalbumin, Urine 16.1 Not Estab. ug/mL   Microalb/Creat Ratio 26 0 - 29 mg/g creat  Comprehensive metabolic panel   Collection Time: 10/16/23  4:12 PM  Result Value Ref Range   Glucose 122 (H) 70 - 99 mg/dL   BUN 15 8 - 27 mg/dL   Creatinine, Ser 0.96 0.57 - 1.00 mg/dL   eGFR 71 >04 VW/UJW/1.19   BUN/Creatinine Ratio 17 12 - 28   Sodium 144 134 - 144 mmol/L   Potassium 4.1 3.5 - 5.2 mmol/L   Chloride 105 96 - 106 mmol/L   CO2 24 20 - 29 mmol/L   Calcium  9.5 8.7 - 10.3 mg/dL   Total Protein 7.0 6.0 - 8.5 g/dL  Albumin 4.1 3.9 - 4.9 g/dL   Globulin, Total 2.9 1.5 - 4.5 g/dL   Bilirubin Total <1.6 0.0 - 1.2 mg/dL   Alkaline Phosphatase 94 44 - 121 IU/L   AST 15 0 - 40 IU/L   ALT 15 0 - 32 IU/L  HgB A1c   Collection Time: 10/16/23  4:12 PM  Result Value Ref Range   Hgb A1c MFr Bld 6.4 (H) 4.8 - 5.6 %   Est. average glucose Bld gHb Est-mCnc 137 mg/dL      Assessment & Plan:   Problem List Items Addressed This Visit       Musculoskeletal and Integument   Plantar fasciitis - Primary   Acute for 5 weeks now, no improvement with simple treatment methods at home.  She tried frozen water  bottle, tennis ball with frozen water  inside + Tylenol  and Voltaren gel.  Wear heel support, showed her examples she could obtain from drugstore or online.  Rest foot often.  At this time will get into podiatry due to ongoing pain and no improvement.      Relevant Orders   Ambulatory referral to Podiatry      Follow up plan: Return if symptoms worsen or fail to improve, for as scheduled May 19th.

## 2023-12-21 NOTE — Assessment & Plan Note (Signed)
 Acute for 5 weeks now, no improvement with simple treatment methods at home.  She tried frozen water  bottle, tennis ball with frozen water  inside + Tylenol  and Voltaren gel.  Wear heel support, showed her examples she could obtain from drugstore or online.  Rest foot often.  At this time will get into podiatry due to ongoing pain and no improvement.

## 2023-12-29 ENCOUNTER — Ambulatory Visit: Admitting: Podiatry

## 2023-12-29 DIAGNOSIS — M722 Plantar fascial fibromatosis: Secondary | ICD-10-CM | POA: Diagnosis not present

## 2023-12-29 NOTE — Progress Notes (Signed)
 Subjective:  Patient ID: Heather Hill, female    DOB: Sep 10, 1955,  MRN: [id number]  Chief Complaint  Patient presents with   Foot Pain    Left foot pt stated that she has a burning sensation when she walks she stated she would like to have her nails trimmed down     68 y.o. female presents with the above complaint. Patient presents with complaint of left heel pain that has been going on for quite some time is progressive gotten worse worse with ambulation worse with pressure patient would like to discuss treatment options for it she has not seen anyone else prior to seeing me pain scale 7 out of 10 dull aching nature   Review of Systems: Negative except as noted in the HPI. Denies N/V/F/Ch.  Past Medical History:  Diagnosis Date   Anxiety    Depression    Sleep apnea    Vertigo    none for over 8 yrs   Wears dentures    partial upper    Current Outpatient Medications:    atorvastatin  (LIPITOR) 10 MG tablet, TAKE 1 TABLET(10 MG) BY MOUTH DAILY (Patient not taking: Reported on 10/19/2023), Disp: 90 tablet, Rfl: 4   meclizine  (ANTIVERT ) 12.5 MG tablet, Take 1 tablet (12.5 mg total) by mouth 3 (three) times daily as needed for dizziness., Disp: 30 tablet, Rfl: 5   sertraline  (ZOLOFT ) 100 MG tablet, Take 1 tablet (100 mg total) by mouth at bedtime., Disp: 30 tablet, Rfl: 3   traZODone  (DESYREL ) 150 MG tablet, Take 1 tablet (150 mg total) by mouth at bedtime as needed for sleep., Disp: 30 tablet, Rfl: 3  Social History   Tobacco Use  Smoking Status Former   Current packs/day: 0.00   Average packs/day: 0.5 packs/day for 50.7 years (25.3 ttl pk-yrs)   Types: Cigarettes   Start date: 56   Quit date: 04/2023   Years since quitting: 0.6  Smokeless Tobacco Never  Tobacco Comments   10 cigarettes daily- khj 01/28/2023    Allergies  Allergen Reactions   Penicillins Hives   Peanut-Containing Drug Products Itching and Rash    Peanuts   Objective:  There were no vitals filed  for this visit. There is no height or weight on file to calculate BMI. Constitutional Well developed. Well nourished.  Vascular Dorsalis pedis pulses palpable bilaterally. Posterior tibial pulses palpable bilaterally. Capillary refill normal to all digits.  No cyanosis or clubbing noted. Pedal hair growth normal.  Neurologic Normal speech. Oriented to person, place, and time. Epicritic sensation to light touch grossly present bilaterally.  Dermatologic Nails well groomed and normal in appearance. No open wounds. No skin lesions.  Orthopedic: Normal joint ROM without pain or crepitus bilaterally. No visible deformities. Tender to palpation at the calcaneal tuber left. No pain with calcaneal squeeze left. Ankle ROM diminished range of motion left. Silfverskiold Test: positive left.   Radiographs: None  Assessment:  No diagnosis found. Plan:  Patient was evaluated and treated and all questions answered.  Plantar Fasciitis, left - XR reviewed as above.  - Educated on icing and stretching. Instructions given.  - Injection delivered to the plantar fascia as below. - DME: Plantar fascial brace dispensed to support the medial longitudinal arch of the foot and offload pressure from the heel and prevent arch collapse during weightbearing - Pharmacologic management: None  Procedure: Injection Tendon/Ligament Location: Left plantar fascia at the glabrous junction; medial approach. Skin Prep: alcohol Injectate: 0.5 cc 0.5% marcaine  plain, 0.5  cc of 1% Lidocaine , 0.5 cc kenalog 10. Disposition: Patient tolerated procedure well. Injection site dressed with a band-aid.  No follow-ups on file.

## 2024-01-11 ENCOUNTER — Encounter: Payer: Self-pay | Admitting: Nurse Practitioner

## 2024-01-11 ENCOUNTER — Ambulatory Visit (INDEPENDENT_AMBULATORY_CARE_PROVIDER_SITE_OTHER): Payer: Self-pay | Admitting: Nurse Practitioner

## 2024-01-11 VITALS — BP 94/62 | HR 71 | Temp 98.4°F | Resp 15 | Ht 65.0 in | Wt 197.0 lb

## 2024-01-11 DIAGNOSIS — E782 Mixed hyperlipidemia: Secondary | ICD-10-CM | POA: Diagnosis not present

## 2024-01-11 DIAGNOSIS — I700 Atherosclerosis of aorta: Secondary | ICD-10-CM | POA: Diagnosis not present

## 2024-01-11 DIAGNOSIS — E6609 Other obesity due to excess calories: Secondary | ICD-10-CM

## 2024-01-11 DIAGNOSIS — F331 Major depressive disorder, recurrent, moderate: Secondary | ICD-10-CM

## 2024-01-11 DIAGNOSIS — R7301 Impaired fasting glucose: Secondary | ICD-10-CM

## 2024-01-11 DIAGNOSIS — J432 Centrilobular emphysema: Secondary | ICD-10-CM | POA: Diagnosis not present

## 2024-01-11 DIAGNOSIS — Z6831 Body mass index (BMI) 31.0-31.9, adult: Secondary | ICD-10-CM

## 2024-01-11 DIAGNOSIS — F17210 Nicotine dependence, cigarettes, uncomplicated: Secondary | ICD-10-CM

## 2024-01-11 DIAGNOSIS — E66811 Obesity, class 1: Secondary | ICD-10-CM

## 2024-01-11 LAB — BAYER DCA HB A1C WAIVED: HB A1C (BAYER DCA - WAIVED): 5.8 % — ABNORMAL HIGH (ref 4.8–5.6)

## 2024-01-11 MED ORDER — ATORVASTATIN CALCIUM 10 MG PO TABS: ORAL_TABLET | ORAL | 4 refills | Status: DC

## 2024-01-11 NOTE — Progress Notes (Signed)
 BP 94/62 (BP Location: Left Arm, Patient Position: Sitting, Cuff Size: Large)   Pulse 71   Temp 98.4 F (36.9 C) (Oral)   Resp 15   Ht 5\' 5"  (1.651 m)   Wt 197 lb (89.4 kg)   SpO2 98%   BMI 32.78 kg/m    Subjective:    Patient ID: Heather Hill, female    DOB: 02-Mar-1956, 68 y.o.   MRN: [id number]  HPI: Heather Hill is a 68 y.o. female  Chief Complaint  Patient presents with   Mood    Doing good so far   Hyperlipidemia   COPD    No issues recently.    Obesity    Would like to discuss her options for medication management   COPD Lung screening last 01/26/2023.  Continues to note emphysema and aortic atherosclerosis. Borderline mild axillary lymphadenopathy they monitor this closely.  Quit smoking August 2024, but has had a few cigarettes since then. COPD status: stable Satisfied with current treatment?: yes Oxygen use: no Dyspnea frequency: no Cough frequency: no Rescue inhaler frequency:  none Limitation of activity: no Productive cough: none Last Spirometry: none Pneumovax: Up to Date Influenza: Up to Date   Impaired Fasting Glucose HbA1C:  Lab Results  Component Value Date   HGBA1C 5.8 (H) 01/11/2024  Duration of elevated blood sugar: years Polydipsia: no Polyuria: no Weight change: no Visual disturbance: no Glucose Monitoring: no    Accucheck frequency: Not Checking    Fasting glucose:     Post prandial:  Diabetic Education: Not Completed Family history of diabetes: yes -- moms side  HYPERLIPIDEMIA Not taking Atorvastatin , has not taken for awhile as we took break to see how levels would be.  Focusing on diet. Hyperlipidemia status: poor compliance Supplements: fish oil Aspirin:  no The 10-year ASCVD risk score (Arnett DK, et al., 2019) is: 3.7%   Values used to calculate the score:     Age: 89 years     Sex: Female     Is Non-Hispanic African American: Yes     Diabetic: No     Tobacco smoker: No     Systolic Blood Pressure: 94 mmHg      Is BP treated: No     HDL Cholesterol: 48 mg/dL     Total Cholesterol: 147 mg/dL Chest pain:  no Coronary artery disease:  no Family history CAD:  no Family history early CAD:  no   DEPRESSION Follows with psychiatry, last visit was 12/02/23.  Continues on Zoloft  and Trazodone .  Past medications: fluoxetine , Abilify , lorazepam  Mood status: stable Satisfied with current treatment?: yes Symptom severity: moderate  Duration of current treatment : chronic Side effects: no Medication compliance: good compliance Psychotherapy/counseling: yes in the past Depressed mood: improved Anxious mood: improved Anhedonia: no Significant weight loss or gain: no Insomnia: controlled with medication Fatigue: no Feelings of worthlessness or guilt: no Impaired concentration/indecisiveness: no Suicidal ideations: no Hopelessness: no Crying spells: no    01/11/2024    3:45 PM 12/04/2023    8:48 AM 07/28/2023    3:57 PM 07/14/2023   10:07 AM 07/06/2023    9:23 AM  Depression screen PHQ 2/9  Decreased Interest 0 0 0 0 0  Down, Depressed, Hopeless 0 0 0 0 0  PHQ - 2 Score 0 0 0 0 0  Altered sleeping 1 0 0 2   Tired, decreased energy 0 1 0 2   Change in appetite 0 0 0 2  Feeling bad or failure about yourself  0 0 0 0   Trouble concentrating 0 0 0 0   Moving slowly or fidgety/restless 0 0 0 0   Suicidal thoughts 0 0 0 0   PHQ-9 Score 1 1 0 6   Difficult doing work/chores  Not difficult at all Not difficult at all Not difficult at all        01/11/2024    3:46 PM 12/04/2023    8:49 AM 07/14/2023   10:08 AM 05/29/2023    9:22 AM  GAD 7 : Generalized Anxiety Score  Nervous, Anxious, on Edge 0 1 2 0  Control/stop worrying 0 1 0 0  Worry too much - different things 0 0 0 0  Trouble relaxing 0 1 0 3  Restless 0 0 0 2  Easily annoyed or irritable 0 0 0 0  Afraid - awful might happen 0 0 0 1  Total GAD 7 Score 0 3 2 6   Anxiety Difficulty  Not difficult at all Not difficult at all Not  difficult at all   Relevant past medical, surgical, family and social history reviewed and updated as indicated. Interim medical history since our last visit reviewed. Allergies and medications reviewed and updated.  Review of Systems  Constitutional:  Negative for activity change, appetite change, diaphoresis, fatigue and fever.  Respiratory:  Negative for cough, chest tightness and shortness of breath.   Cardiovascular:  Negative for chest pain, palpitations and leg swelling.  Gastrointestinal: Negative.   Endocrine: Negative for polydipsia, polyphagia and polyuria.  Neurological: Negative.   Psychiatric/Behavioral: Negative.      Per HPI unless specifically indicated above     Objective:     BP 94/62 (BP Location: Left Arm, Patient Position: Sitting, Cuff Size: Large)   Pulse 71   Temp 98.4 F (36.9 C) (Oral)   Resp 15   Ht 5\' 5"  (1.651 m)   Wt 197 lb (89.4 kg)   SpO2 98%   BMI 32.78 kg/m   Wt Readings from Last 3 Encounters:  01/11/24 197 lb (89.4 kg)  12/21/23 195 lb 4.8 oz (88.6 kg)  12/04/23 198 lb 9.6 oz (90.1 kg)    Physical Exam Vitals and nursing note reviewed.  Constitutional:      General: She is awake. She is not in acute distress.    Appearance: She is well-developed and well-groomed. She is obese. She is not ill-appearing or toxic-appearing.  HENT:     Head: Normocephalic.     Right Ear: Hearing and external ear normal.     Left Ear: Hearing and external ear normal.  Eyes:     General: Lids are normal.        Right eye: No discharge.        Left eye: No discharge.     Conjunctiva/sclera: Conjunctivae normal.     Pupils: Pupils are equal, round, and reactive to light.  Neck:     Thyroid : No thyromegaly.     Vascular: No carotid bruit.  Cardiovascular:     Rate and Rhythm: Normal rate and regular rhythm.     Heart sounds: Normal heart sounds. No murmur heard.    No gallop.  Pulmonary:     Effort: Pulmonary effort is normal. No accessory muscle  usage or respiratory distress.     Breath sounds: Normal breath sounds.  Abdominal:     General: Bowel sounds are normal. There is no distension.     Palpations: Abdomen is  soft.     Tenderness: There is no abdominal tenderness.  Musculoskeletal:     Cervical back: Normal range of motion and neck supple.     Right lower leg: No edema.     Left lower leg: No edema.  Lymphadenopathy:     Cervical: No cervical adenopathy.  Skin:    General: Skin is warm and dry.  Neurological:     Mental Status: She is alert and oriented to person, place, and time.     Deep Tendon Reflexes: Reflexes are normal and symmetric.     Reflex Scores:      Brachioradialis reflexes are 2+ on the right side and 2+ on the left side.      Patellar reflexes are 2+ on the right side and 2+ on the left side. Psychiatric:        Attention and Perception: Attention normal.        Mood and Affect: Mood normal.        Speech: Speech normal.        Behavior: Behavior normal. Behavior is cooperative.        Thought Content: Thought content normal.     Results for orders placed or performed in visit on 01/11/24  Bayer DCA Hb A1c Waived   Collection Time: 01/11/24  3:49 PM  Result Value Ref Range   HB A1C (BAYER DCA - WAIVED) 5.8 (H) 4.8 - 5.6 %      Assessment & Plan:   Problem List Items Addressed This Visit       Cardiovascular and Mediastinum   Aortic atherosclerosis (HCC)   Chronic, noted on imaging 01/04/22.  Take Baby ASA 81 MG daily. May restart statin in future.      Relevant Medications   atorvastatin  (LIPITOR) 10 MG tablet   Other Relevant Orders   Comprehensive metabolic panel with GFR   Lipid Panel w/o Chol/HDL Ratio     Respiratory   Centrilobular emphysema (HCC) - Primary   Chronic, noted on initial CT lung screening 01/22/22.  At this time no inhalers or symptoms reported.  Will plan on spirometry at next visit to further assess, discussed with patient.  She agrees with plan of care.   Initiate inhalers as needed.  Recommend complete cessation of smoking.  Continue yearly lung screening, due end of June 2025.        Endocrine   IFG (impaired fasting glucose)   Ongoing.  A1c today continues to trend down at 5.8%, ?if Abilify  in past affected levels (was 6.6% at one point) as levels now improved without this on board.  Recommend ongoing diet and exercise focus.  We discussed plan of care.      Relevant Orders   Bayer DCA Hb A1c Waived (Completed)     Other   Obesity   BMI 32.78.  Recommended eating smaller high protein, low fat meals more frequently and exercising 30 mins a day 5 times a week with a goal of 10-15lb weight loss in the next 3 months. Patient voiced their understanding and motivation to adhere to these recommendations.       Nicotine  dependence, cigarettes, uncomplicated   Quit smoking at end of August 2024, but has had a few since.  Educated her on quitting and benefit to lungs. Recommend complete cessation.      Moderate episode of recurrent major depressive disorder (HCC)   Chronic, ongoing.  Denies SI/HI.  Does have a safety plan in place if intrusive thoughts were  to present, would reach out to daughter or go to ER.  Currently followed by psychiatry, Dr. Edda Goo.  Continue current regimen at this time and defer changes to psychiatry.        Hyperlipidemia   Chronic, ongoing.  She has been off medication to trial and focused on diet changes.  Recommend cut back on red meat, cheese, processed foods.  Complete cessation of smoking recommended.      Relevant Medications   atorvastatin  (LIPITOR) 10 MG tablet   Other Relevant Orders   Comprehensive metabolic panel with GFR   Lipid Panel w/o Chol/HDL Ratio     Follow up plan: Return in about 6 months (around 07/13/2024) for Annual physical after 07/13/24.

## 2024-01-11 NOTE — Assessment & Plan Note (Signed)
BMI 32.78.  Recommended eating smaller high protein, low fat meals more frequently and exercising 30 mins a day 5 times a week with a goal of 10-15lb weight loss in the next 3 months. Patient voiced their understanding and motivation to adhere to these recommendations.  

## 2024-01-11 NOTE — Patient Instructions (Signed)

## 2024-01-11 NOTE — Assessment & Plan Note (Addendum)
 Chronic, noted on imaging 01/04/22.  Take Baby ASA 81 MG daily. May restart statin in future.

## 2024-01-11 NOTE — Assessment & Plan Note (Signed)
 Quit smoking at end of August 2024, but has had a few since.  Educated her on quitting and benefit to lungs. Recommend complete cessation.

## 2024-01-11 NOTE — Assessment & Plan Note (Signed)
 Chronic, ongoing.  Denies SI/HI.  Does have a safety plan in place if intrusive thoughts were to present, would reach out to daughter or go to ER.  Currently followed by psychiatry, Dr. Vanetta Shawl.  Continue current regimen at this time and defer changes to psychiatry.

## 2024-01-11 NOTE — Assessment & Plan Note (Signed)
 Chronic, noted on initial CT lung screening 01/22/22.  At this time no inhalers or symptoms reported.  Will plan on spirometry at next visit to further assess, discussed with patient.  She agrees with plan of care.  Initiate inhalers as needed.  Recommend complete cessation of smoking.  Continue yearly lung screening, due end of June 2025.

## 2024-01-11 NOTE — Assessment & Plan Note (Signed)
 Chronic, ongoing.  She has been off medication to trial and focused on diet changes.  Recommend cut back on red meat, cheese, processed foods.  Complete cessation of smoking recommended.

## 2024-01-11 NOTE — Assessment & Plan Note (Signed)
 Ongoing.  A1c today continues to trend down at 5.8%, ?if Abilify  in past affected levels (was 6.6% at one point) as levels now improved without this on board.  Recommend ongoing diet and exercise focus.  We discussed plan of care.

## 2024-01-12 ENCOUNTER — Ambulatory Visit: Payer: Self-pay | Admitting: Nurse Practitioner

## 2024-01-12 LAB — COMPREHENSIVE METABOLIC PANEL WITH GFR
ALT: 12 IU/L (ref 0–32)
AST: 15 IU/L (ref 0–40)
Albumin: 4 g/dL (ref 3.9–4.9)
Alkaline Phosphatase: 97 IU/L (ref 44–121)
BUN/Creatinine Ratio: 14 (ref 12–28)
BUN: 14 mg/dL (ref 8–27)
Bilirubin Total: <0.2 mg/dL (ref 0.0–1.2)
CO2: 21 mmol/L (ref 20–29)
Calcium: 9.7 mg/dL (ref 8.7–10.3)
Chloride: 101 mmol/L (ref 96–106)
Creatinine, Ser: 0.98 mg/dL (ref 0.57–1.00)
Globulin, Total: 3.2 g/dL (ref 1.5–4.5)
Glucose: 86 mg/dL (ref 70–99)
Potassium: 4.4 mmol/L (ref 3.5–5.2)
Sodium: 140 mmol/L (ref 134–144)
Total Protein: 7.2 g/dL (ref 6.0–8.5)
eGFR: 63 mL/min/{1.73_m2} (ref >59)

## 2024-01-12 LAB — LIPID PANEL W/O CHOL/HDL RATIO
Cholesterol, Total: 159 mg/dL (ref 100–199)
HDL: 50 mg/dL (ref >39)
LDL Chol Calc (NIH): 85 mg/dL (ref 0–99)
Triglycerides: 135 mg/dL (ref 0–149)
VLDL Cholesterol Cal: 24 mg/dL (ref 5–40)

## 2024-01-12 NOTE — Progress Notes (Signed)
 Contacted via MyChart   Good morning Heather Hill, your labs have returned and overall remain stable.  No medication changes needed.  Any questions? Keep being amazing!!  Thank you for allowing me to participate in your care.  I appreciate you. Kindest regards, Shyne Resch

## 2024-01-15 NOTE — Progress Notes (Unsigned)
 Virtual Visit via Video Note  I connected with Heather Hill on 01/20/24 at  4:30 PM EDT by a video enabled telemedicine application and verified that I am speaking with the correct person using two identifiers.  Location: Patient: home Provider: home office Persons participated in the visit- patient, provider    I discussed the limitations of evaluation and management by telemedicine and the availability of in person appointments. The patient expressed understanding and agreed to proceed.  I discussed the assessment and treatment plan with the patient. The patient was provided an opportunity to ask questions and all were answered. The patient agreed with the plan and demonstrated an understanding of the instructions.   The patient was advised to call back or seek an in-person evaluation if the symptoms worsen or if the condition fails to improve as anticipated.   Todd Fossa, MD    [facility] MD/PA/NP OP Progress Note  01/20/2024 4:59 PM Heather Hill  MRN:  [id number]  Chief Complaint:  Chief Complaint  Patient presents with   Follow-up   HPI:  This is a follow-up appointment for depression and insomnia.  She states that she tends to procrastinate the house project.  She is hoping to trim bushes.  She tries to accomplish some small thing.  She was able to donate clothes to [employer].  She continues to do part-time work.  She denies any concern about the neighbor.  She called the police when their truck closed her driveway.  She enjoys getting a chair in the yard, looking up recipe on the phone.  She thinks she has been back to her normal self.  She reports feeling upset about her family.  Her nephew wanted her husband's truck, while not completing the payment.  It escalated to the tension between her and her sister.  Although she could not sleep well in that time, it has been better.  She also blocked the other sister's contact.  She denies feeling depressed.  She has good appetite.   She denies SI.  She agrees with the plans as outlined below.   Substance use   Tobacco Alcohol Other substances/  Current Not since August, on nicotine  patch A small beer every other day Used to drink Two glasses of vodka twice a week or less denies  Past  1/2 PPD Two glasses of vodka every night (denied DUI/DWI) denies  Past Treatment           Support: Household: by herself Marital status: widow Number of children: 1 daughter (her son deceased at age 60 in 02-11-02, transferred back from [place]) Employment: CNA part time, [employer], 3 days a week Education: 12th grade, [employer] for two years    Visit Diagnosis:    ICD-10-CM   1. MDD (major depressive disorder), recurrent, in partial remission (HCC)  F33.41     2. Insomnia, unspecified type  G47.00       Past Psychiatric History: Please see initial evaluation for full details. I have reviewed the history. No updates at this time.     Past Medical History:  Past Medical History:  Diagnosis Date   Anxiety    Depression    Sleep apnea    Vertigo    none for over 8 yrs   Wears dentures    partial upper    Past Surgical History:  Procedure Laterality Date   AXILLARY LYMPH NODE BIOPSY Left 04/10/2022   Procedure: AXILLARY LYMPH NODE BIOPSY, excisional;  Surgeon: [facility],  Heather Gruber, MD;  Location: [facility];  Service: General;  Laterality: Left;   BREAST BIOPSY Left 03/06/2022   stereo bx, calcs, "COIL" clip benign   BREAST EXCISIONAL BIOPSY Left 03/2022   excision of lymph node-benign   COLONOSCOPY WITH PROPOFOL  N/A 02/02/2019   Procedure: COLONOSCOPY WITH PROPOFOL ;  Surgeon: Heather Daily, MD;  [facility];  Service: Endoscopy;  Laterality: N/A;   POLYPECTOMY  02/02/2019   Procedure: POLYPECTOMY;  Surgeon: Heather Daily, MD;  [facility];  Service: Endoscopy;;   TOTAL ABDOMINAL HYSTERECTOMY     TUBAL LIGATION      Family Psychiatric History: Please see initial evaluation for  full details. I have reviewed the history. No updates at this time.     Family History:  Family History  Problem Relation Age of Onset   Diabetes Mother    Alzheimer's disease Mother    Hypertension Father    Asthma Father    Diabetes Father    Bipolar disorder Sister    Diabetes Sister    Diabetes Brother    Breast cancer Maternal Aunt    Diabetes Maternal Grandfather    Alzheimer's disease Maternal Grandfather    Heart disease Maternal Grandfather    Asthma Paternal Grandfather    Diabetes Paternal Grandfather    Hypertension Paternal Grandfather     Social History:  Social History   Socioeconomic History   Marital status: Widowed    Spouse name: Not on file   Number of children: 2   Years of education: Not on file   Highest education level: Some college, no degree  Occupational History   Occupation: retired  Tobacco Use   Smoking status: Former    Current packs/day: 0.00    Average packs/day: 0.5 packs/day for 50.7 years (25.3 ttl pk-yrs)    Types: Cigarettes    Start date: 17    Quit date: 04/2023    Years since quitting: 0.7   Smokeless tobacco: Never   Tobacco comments:    10 cigarettes Hill- khj 01/28/2023  Vaping Use   Vaping status: Former   Quit date: 07/27/2013   Substances: Nicotine , Flavoring  Substance and Sexual Activity   Alcohol use: Yes    Alcohol/week: 2.0 standard drinks of alcohol    Types: 2 Glasses of wine per week    Comment: 1 glass of wine or beer 1-2 times per week   Drug use: Never   Sexual activity: Not Currently  Other Topics Concern   Not on file  Social History Narrative   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   1 child living and 1 deceased son [facility])   Social Drivers of Corporate investment banker Strain: Low Risk  (07/28/2023)   Overall Financial Resource Strain (CARDIA)    Difficulty of Paying Living Expenses: Not hard at all  Food Insecurity: No Food Insecurity (07/28/2023)   Hunger Vital Sign     Worried About Running Out of Food in the Last Year: Never true    Ran Out of Food in the Last Year: Never true  Transportation Needs: No Transportation Needs (07/28/2023)   PRAPARE - Administrator, Civil Service (Medical): No    Lack of Transportation (Non-Medical): No  Physical Activity: Inactive (07/28/2023)   Exercise Vital Sign    Days of Exercise per Week: 0 days    Minutes of Exercise per Session: 0 min  Stress: No Stress  Concern Present (07/28/2023)   [employer] of Occupational Health - Occupational Stress Questionnaire    Feeling of Stress : Only a little  Social Connections: Socially Isolated (07/28/2023)   Social Connection and Isolation Panel [NHANES]    Frequency of Communication with Friends and Family: More than three times a week    Frequency of Social Gatherings with Friends and Family: More than three times a week    Attends Religious Services: Never    Database administrator or Organizations: No    Attends Banker Meetings: Never    Marital Status: Widowed    Allergies:  Allergies  Allergen Reactions   Penicillins Hives   Peanut-Containing Drug Products Itching and Rash    Peanuts    Metabolic Disorder Labs: Lab Results  Component Value Date   HGBA1C 5.8 (H) 01/11/2024   No results found for: "PROLACTIN" Lab Results  Component Value Date   CHOL 159 01/11/2024   TRIG 135 01/11/2024   HDL 50 01/11/2024   VLDL 16 02/28/2019   LDLCALC 85 01/11/2024   LDLCALC 83 07/14/2023   Lab Results  Component Value Date   TSH 1.230 05/29/2023   TSH 0.940 07/09/2022    Therapeutic Level Labs: No results found for: "LITHIUM" No results found for: "VALPROATE" No results found for: "CBMZ"  Current Medications: Current Outpatient Medications  Medication Sig Dispense Refill   atorvastatin  (LIPITOR) 10 MG tablet TAKE 1 TABLET(10 MG) BY MOUTH Hill 90 tablet 4   meclizine  (ANTIVERT ) 12.5 MG tablet Take 1 tablet (12.5 mg total) by mouth  3 (three) times Hill as needed for dizziness. 30 tablet 5   sertraline  (ZOLOFT ) 100 MG tablet Take 1 tablet (100 mg total) by mouth at bedtime. 30 tablet 3   [START ON 02/13/2024] traZODone  (DESYREL ) 150 MG tablet Take 1 tablet (150 mg total) by mouth at bedtime as needed for sleep. 30 tablet 3   No current facility-administered medications for this visit.     Musculoskeletal: Strength & Muscle Tone: N/A Gait & Station: N/A Patient leans: N/A  Psychiatric Specialty Exam: Review of Systems  Psychiatric/Behavioral:  Positive for sleep disturbance. Negative for agitation, behavioral problems, confusion, decreased concentration, dysphoric mood, hallucinations, self-injury and suicidal ideas. The patient is not nervous/anxious and is not hyperactive.   All other systems reviewed and are negative.   There were no vitals taken for this visit.There is no height or weight on file to calculate BMI.  General Appearance: Well Groomed  Eye Contact:  Good  Speech:  Clear and Coherent  Volume:  Normal  Mood:  good  Affect:  Appropriate, Congruent, and Full Range  Thought Process:  Coherent  Orientation:  Full (Time, Place, and Person)  Thought Content: Logical   Suicidal Thoughts:  No  Homicidal Thoughts:  No  Memory:  Immediate;   Good  Judgement:  Good  Insight:  Good  Psychomotor Activity:  Normal  Concentration:  Concentration: Good and Attention Span: Good  Recall:  Good  Fund of Knowledge: Good  Language: Good  Akathisia:  No  Handed:  Right  AIMS (if indicated): not done  Assets:  Communication Skills Desire for Improvement  ADL's:  Intact  Cognition: WNL  Sleep:  Fair   Screenings: AUDIT    Flowsheet Row Clinical Support from 06/11/2022 in [facility]  Alcohol Use Disorder Identification Test Final Score (AUDIT) 8      GAD-7    Flowsheet Row Office Visit from  01/11/2024 in [facility] Office Visit from 12/04/2023 in  [facility] Office Visit from 07/14/2023 in [facility] Office Visit from 05/29/2023 in [facility] Office Visit from 03/03/2023 in [facility]  Total GAD-7 Score 0 3 2 6 12       PHQ2-9    Flowsheet Row Office Visit from 01/11/2024 in [facility] Office Visit from 12/04/2023 in [facility] Clinical Support from 07/28/2023 in [facility] Office Visit from 07/14/2023 in [facility] Patient Outreach from 07/06/2023 in [place] [facility]  PHQ-2 Total Score 0 0 0 0 0  PHQ-9 Total Score 1 1 0 6 --      Flowsheet Row Pre-Admission Testing 45 from 04/02/2022 in [facility] PRE ADMISSION TESTING  C-SSRS RISK CATEGORY No Risk        Assessment and Plan:  ROMELL CAVANAH is a 68 y.o. year old female with a history of depression,s/p hysterectomy, who presents for follow up for below.  1. MDD (major depressive disorder), recurrent, in partial remission (HCC) Acute stressors include: conflict with her sisters over her mother's will, attempted break in to her car Other stressors include: loss of her husband from MI in 2021, loss of her son (transferred from [place]) in 2000 at age 49, financial strain,     History: transferred from [facility], was on fluoxetine  40 mg BID, Abilify  5 mg Hill, lorazepam 1 mg twice a day- ran out a week prior to the visit She denies any significant mood symptoms since the last visit except the frustration against her family.  Will continue current dose of sertraline  to target depression.   2. Insomnia, unspecified type - +snoring. Unable to proceed with HST due to financial strain - drinks coffee two cups per day every other day   Although there was slight worsening in the context of conflict with her family, it has been improving  overall.  Will continue current dose of trazodone  as needed for insomnia.   # history of alcohol use She reports a history of alcohol use when her husband was alive, who also struggled with alcohol-related issues. We will continue to assess this.    Plan Continue sertraline  100 mg at night  Continue trazodone  150 mg at night as needed for insomnia Next appointment: 7/30 at 4 30, video   Past trials of medication: fluoxetine , Abilify , lorazepam     The patient demonstrates the following risk factors for suicide: Chronic risk factors for suicide include: psychiatric disorder of depression . Acute risk factors for suicide include: loss (financial, interpersonal, professional). Protective factors for this patient include: positive social support, responsibility to others (children, family), coping skills, and hope for the future. Considering these factors, the overall suicide risk at this point appears to be low. Patient is appropriate for outpatient follow up.     Collaboration of Care: Collaboration of Care: Other reviewed notes in Epic  Patient/Guardian was advised Release of Information must be obtained prior to any record release in order to collaborate their care with an outside provider. Patient/Guardian was advised if they have not already done so to contact the registration department to sign all necessary forms in order for us  to release information regarding their care.   Consent: Patient/Guardian gives verbal consent for treatment and assignment of benefits for services provided during this visit.  Patient/Guardian expressed understanding and agreed to proceed.    Todd Fossa, MD 01/20/2024, 4:59 PM

## 2024-01-20 ENCOUNTER — Encounter: Payer: Self-pay | Admitting: Psychiatry

## 2024-01-20 ENCOUNTER — Telehealth: Admitting: Psychiatry

## 2024-01-20 DIAGNOSIS — G4700 Insomnia, unspecified: Secondary | ICD-10-CM | POA: Diagnosis not present

## 2024-01-20 DIAGNOSIS — F3341 Major depressive disorder, recurrent, in partial remission: Secondary | ICD-10-CM

## 2024-01-20 MED ORDER — TRAZODONE HCL 150 MG PO TABS: 150.0000 mg | ORAL_TABLET | Freq: Every evening | ORAL | 3 refills | Status: DC | PRN

## 2024-01-20 NOTE — Patient Instructions (Signed)
 Continue sertraline  100 mg at night  Continue trazodone  150 mg at night as needed for insomnia Next appointment: 7/30 at 4 30

## 2024-01-26 ENCOUNTER — Ambulatory Visit: Admitting: Podiatry

## 2024-01-26 DIAGNOSIS — M722 Plantar fascial fibromatosis: Secondary | ICD-10-CM

## 2024-01-26 DIAGNOSIS — M62462 Contracture of muscle, left lower leg: Secondary | ICD-10-CM | POA: Diagnosis not present

## 2024-01-26 NOTE — Progress Notes (Signed)
 Subjective:  Patient ID: Heather Hill, female    DOB: 1956-03-04,  MRN: [id number]  Chief Complaint  Patient presents with   Plantar Fasciitis    Pt stated that she still has a burning sensation    68 y.o. female presents with the above complaint.  Patient presents with left plantar fasciitis follow-up.  She states is doing better she still has a residual pain.  She states she has some improvement.  She started noticing some burning as tingling.  Denies any other acute issues   Review of Systems: Negative except as noted in the HPI. Denies N/V/F/Ch.  Past Medical History:  Diagnosis Date   Anxiety    Depression    Sleep apnea    Vertigo    none for over 8 yrs   Wears dentures    partial upper    Current Outpatient Medications:    atorvastatin  (LIPITOR) 10 MG tablet, TAKE 1 TABLET(10 MG) BY MOUTH DAILY, Disp: 90 tablet, Rfl: 4   meclizine  (ANTIVERT ) 12.5 MG tablet, Take 1 tablet (12.5 mg total) by mouth 3 (three) times daily as needed for dizziness., Disp: 30 tablet, Rfl: 5   sertraline  (ZOLOFT ) 100 MG tablet, Take 1 tablet (100 mg total) by mouth at bedtime., Disp: 30 tablet, Rfl: 3   [START ON 02/13/2024] traZODone  (DESYREL ) 150 MG tablet, Take 1 tablet (150 mg total) by mouth at bedtime as needed for sleep., Disp: 30 tablet, Rfl: 3  Social History   Tobacco Use  Smoking Status Former   Current packs/day: 0.00   Average packs/day: 0.5 packs/day for 50.7 years (25.3 ttl pk-yrs)   Types: Cigarettes   Start date: 24   Quit date: 04/2023   Years since quitting: 0.7  Smokeless Tobacco Never  Tobacco Comments   10 cigarettes daily- khj 01/28/2023    Allergies  Allergen Reactions   Penicillins Hives   Peanut-Containing Drug Products Itching and Rash    Peanuts   Objective:  There were no vitals filed for this visit. There is no height or weight on file to calculate BMI. Constitutional Well developed. Well nourished.  Vascular Dorsalis pedis pulses palpable  bilaterally. Posterior tibial pulses palpable bilaterally. Capillary refill normal to all digits.  No cyanosis or clubbing noted. Pedal hair growth normal.  Neurologic Normal speech. Oriented to person, place, and time. Epicritic sensation to light touch grossly present bilaterally.  Dermatologic Nails well groomed and normal in appearance. No open wounds. No skin lesions.  Orthopedic: Normal joint ROM without pain or crepitus bilaterally. No visible deformities. Tender to palpation at the calcaneal tuber left. No pain with calcaneal squeeze left. Ankle ROM diminished range of motion left. Silfverskiold Test: positive left.   Radiographs: None  Assessment:   1. Plantar fasciitis of left foot   2. Gastrocnemius equinus, left    Plan:  Patient was evaluated and treated and all questions answered.  Plantar Fasciitis, left underlying gastrocnemius equinus - XR reviewed as above.  - Educated on icing and stretching. Instructions given.  - Second injection delivered to the plantar fascia as below. - DME: Plantar fascial brace dispensed to support the medial longitudinal arch of the foot and offload pressure from the heel and prevent arch collapse during weightbearing - Pharmacologic management: None  Procedure: Injection Tendon/Ligament Location: Left plantar fascia at the glabrous junction; medial approach. Skin Prep: alcohol Injectate: 0.5 cc 0.5% marcaine  plain, 0.5 cc of 1% Lidocaine , 0.5 cc kenalog 10. Disposition: Patient tolerated procedure well. Injection site dressed  with a band-aid.  No follow-ups on file.

## 2024-01-27 ENCOUNTER — Ambulatory Visit
Admission: RE | Admit: 2024-01-27 | Discharge: 2024-01-27 | Disposition: A | Discharge status: Home / Self Care | Source: Ambulatory Visit | Attending: Nurse Practitioner | Admitting: Nurse Practitioner

## 2024-01-27 ENCOUNTER — Other Ambulatory Visit: Payer: Self-pay | Admitting: Nurse Practitioner

## 2024-01-27 DIAGNOSIS — Z87891 Personal history of nicotine dependence: Secondary | ICD-10-CM

## 2024-01-27 DIAGNOSIS — R42 Dizziness and giddiness: Secondary | ICD-10-CM

## 2024-01-27 DIAGNOSIS — Z122 Encounter for screening for malignant neoplasm of respiratory organs: Secondary | ICD-10-CM

## 2024-01-27 DIAGNOSIS — F17210 Nicotine dependence, cigarettes, uncomplicated: Secondary | ICD-10-CM

## 2024-01-28 NOTE — Telephone Encounter (Signed)
 Requested medication (s) are due for refill today: na  Requested medication (s) are on the active medication list: yes   Last refill:  07/14/23 #30 5 refills  Future visit scheduled: yes in 5  months   Notes to clinic:  not delegated per protocol. Do you want to refill Rx?     Requested Prescriptions  Pending Prescriptions Disp Refills   meclizine  (ANTIVERT ) 12.5 MG tablet [Pharmacy Med Name: MECLIZINE  12.5MG  (RX) TABLETS] 30 tablet 5    Sig: TAKE 1 TABLET(12.5 MG) BY MOUTH THREE TIMES DAILY AS NEEDED FOR DIZZINESS     Not Delegated - Gastroenterology: Antiemetics Failed - 01/28/2024  1:13 PM      Failed - This refill cannot be delegated      Passed - Valid encounter within last 6 months    Recent Outpatient Visits           2 weeks ago Centrilobular emphysema (HCC)   [place] [facility] [place], Sanjuan Crumbly T, NP   1 month ago Plantar fasciitis   [place] [facility] [place], Sanjuan Crumbly T, NP   1 month ago Plantar fasciitis   [place] [facility] [place], [place] T, NP   3 months ago IFG (impaired fasting glucose)   [place] [facility] [place], Lavelle Posey, NP       Future Appointments             In 5 months Cannady, Jolene T, NP [place] [facility]

## 2024-02-01 ENCOUNTER — Ambulatory Visit (INDEPENDENT_AMBULATORY_CARE_PROVIDER_SITE_OTHER): Admitting: Podiatry

## 2024-02-01 DIAGNOSIS — Z91198 Patient's noncompliance with other medical treatment and regimen for other reason: Secondary | ICD-10-CM

## 2024-02-06 NOTE — Progress Notes (Signed)
 1. Failure to attend appointment with reason given    Lack of transportation.

## 2024-02-12 ENCOUNTER — Other Ambulatory Visit: Payer: Self-pay

## 2024-02-12 DIAGNOSIS — Z122 Encounter for screening for malignant neoplasm of respiratory organs: Secondary | ICD-10-CM

## 2024-02-12 DIAGNOSIS — F17210 Nicotine dependence, cigarettes, uncomplicated: Secondary | ICD-10-CM

## 2024-02-12 DIAGNOSIS — Z87891 Personal history of nicotine dependence: Secondary | ICD-10-CM

## 2024-02-22 ENCOUNTER — Ambulatory Visit: Admitting: Podiatry

## 2024-02-22 ENCOUNTER — Encounter: Payer: Self-pay | Admitting: Podiatry

## 2024-02-22 DIAGNOSIS — M79675 Pain in left toe(s): Secondary | ICD-10-CM

## 2024-02-22 DIAGNOSIS — B351 Tinea unguium: Secondary | ICD-10-CM | POA: Diagnosis not present

## 2024-02-22 DIAGNOSIS — M79674 Pain in right toe(s): Secondary | ICD-10-CM | POA: Diagnosis not present

## 2024-02-22 NOTE — Progress Notes (Signed)
  Subjective:  Patient ID: Heather Hill, female    DOB: 09-30-1955,  MRN: [id number]  Darren LITTIE Cowden presents to clinic today for painful elongated mycotic toenails 1-5 bilaterally which are tender when wearing enclosed shoe gear. Pain is relieved with periodic professional debridement.  Chief Complaint  Patient presents with   RFC    Rm1 not diabetic/ Dr. Valerio June 2025   New problem(s): None.   PCP is Valerio Melanie DASEN, NP.  Allergies  Allergen Reactions   Penicillins Hives   Peanut-Containing Drug Products Itching and Rash    Peanuts    Review of Systems: Negative except as noted in the HPI.  Objective: No changes noted in today's physical examination. There were no vitals filed for this visit. Lesbia L Jaeger is a pleasant 68 y.o. female in NAD. AAO x 3.  Vascular Examination: Capillary refill time immediate b/l. Palpable pedal pulses. Pedal hair present b/l. No pain with calf compression b/l. Skin temperature gradient WNL b/l. No cyanosis or clubbing b/l. No ischemia or gangrene noted b/l.   Neurological Examination: Sensation grossly intact b/l with 10 gram monofilament. Vibratory sensation intact b/l.   Dermatological Examination: Pedal skin with normal turgor, texture and tone b/l.  No open wounds. No interdigital macerations.   Toenails 1-5 b/l thick, discolored, elongated with subungual debris and pain on dorsal palpation.   Hyperkeratotic lesion(s) medial IPJ of left great toe and medial IPJ of right great toe.  No erythema, no edema, no drainage, no fluctuance.  Musculoskeletal Examination: Muscle strength 5/5 to all lower extremity muscle groups bilaterally. No gross bony deformities bilaterally.  Radiographs: None  Last A1c:      Latest Ref Rng & Units 01/11/2024    3:49 PM 10/16/2023    4:12 PM 07/14/2023   10:39 AM 05/29/2023   10:10 AM  Hemoglobin A1C  Hemoglobin-A1c 4.8 - 5.6 % 5.8  6.4  6.6  6.2    Assessment/Plan: 1. Pain due to  onychomycosis of toenails of both feet     -Consent given for treatment as described below: -Examined patient. -Patient to continue soft, supportive shoe gear daily. -Toenails 1-5 b/l were debrided in length and girth with sterile nail nippers and dremel without iatrogenic bleeding.  -As a courtesy, callus(es) medial IPJ of left great toe and medial IPJ of right great toe pared utilizing sterile scalpel blade without complication or incident. Total number pared=2. -Patient/POA to call should there be question/concern in the interim.   Return in about 3 months (around 05/24/2024).  Delon LITTIE Merlin, DPM      [place] LOCATION: [address], [place] [address]                   Office [phone]   [facility] LOCATION: [address] [place], [place] [address] Office [phone]

## 2024-03-19 NOTE — Progress Notes (Unsigned)
 Virtual Visit via Video Note  I connected with Heather Hill on 03/23/24 at  4:30 PM EDT by a video enabled telemedicine application and verified that I am speaking with the correct person using two identifiers.  Location: Patient: outside Provider: home office Persons participated in the visit- patient, provider    I discussed the limitations of evaluation and management by telemedicine and the availability of in person appointments. The patient expressed understanding and agreed to proceed.   I discussed the assessment and treatment plan with the patient. The patient was provided an opportunity to ask questions and all were answered. The patient agreed with the plan and demonstrated an understanding of the instructions.   The patient was advised to call back or seek an in-person evaluation if the symptoms worsen or if the condition fails to improve as anticipated.   Katheren Sleet, MD     [facility] MD/PA/NP OP Progress Note  03/23/2024 4:56 PM Heather Hill  MRN:  [id number]  Chief Complaint:  Chief Complaint  Patient presents with   Follow-up   HPI:  This is a follow-up appointment for depression and insomnia.  She states that she has been trying to clean the yard.  She has been doing good.  When she has been asked about a very routine, she states that she cleans the house, that is it.  She watches a lot of TV.  There is issues with central, and that she tends to stay in the room.  Her nephew was able to do the final payment today, and he will pick up the truck.  She feels good about this.  She has communicated with her sister again.  She also had a cookout with siblings, and that they are trying to get together again.  She states that she has been doing great, but she thinks she will not be able to open it while if she is not on medication.  She sleeps good most of the time when she takes trazodone .  She denies feeling depressed or anxiety.  She denies SI, HI, hallucinations.   She agrees with the plans as outlined below.    Substance use   Tobacco Alcohol Other substances/  Current Not since April 27, 2024, on nicotine  patch Two drinks several days ago Used to drink Two glasses of vodka twice a week or less denies  Past  1/2 PPD Two glasses of vodka every night (denied DUI/DWI) denies  Past Treatment           Support: Household: by herself Marital status: widow Number of children: 1 daughter (her son deceased at age 70 in April 27, 2002, transferred back from [place]) Employment: CNA part time, [employer], 3 days a week Education: 12th grade, [employer] for two years    Visit Diagnosis:    ICD-10-CM   1. MDD (major depressive disorder), recurrent, in partial remission (HCC)  F33.41     2. Insomnia, unspecified type  G47.00       Past Psychiatric History: Please see initial evaluation for full details. I have reviewed the history. No updates at this time.     Past Medical History:  Past Medical History:  Diagnosis Date   Anxiety    Depression    Sleep apnea    Vertigo    none for over 8 yrs   Wears dentures    partial upper    Past Surgical History:  Procedure Laterality Date   AXILLARY LYMPH NODE BIOPSY Left 04/10/2022   Procedure:  AXILLARY LYMPH NODE BIOPSY, excisional;  Surgeon: Desiderio Schanz, MD;  Location: [facility];  Service: General;  Laterality: Left;   BREAST BIOPSY Left 03/06/2022   stereo bx, calcs, COIL clip benign   BREAST EXCISIONAL BIOPSY Left 03/2022   excision of lymph node-benign   COLONOSCOPY WITH PROPOFOL  N/A 02/02/2019   Procedure: COLONOSCOPY WITH PROPOFOL ;  Surgeon: Unk Corinn Skiff, MD;  Location: [facility];  Service: Endoscopy;  Laterality: N/A;   POLYPECTOMY  02/02/2019   Procedure: POLYPECTOMY;  Surgeon: Unk Corinn Skiff, MD;  Location: [facility];  Service: Endoscopy;;   TOTAL ABDOMINAL HYSTERECTOMY     TUBAL LIGATION      Family Psychiatric History: Please see initial evaluation for full details. I  have reviewed the history. No updates at this time.     Family History:  Family History  Problem Relation Age of Onset   Diabetes Mother    Alzheimer's disease Mother    Hypertension Father    Asthma Father    Diabetes Father    Bipolar disorder Sister    Diabetes Sister    Diabetes Brother    Breast cancer Maternal Aunt    Diabetes Maternal Grandfather    Alzheimer's disease Maternal Grandfather    Heart disease Maternal Grandfather    Asthma Paternal Grandfather    Diabetes Paternal Grandfather    Hypertension Paternal Grandfather     Social History:  Social History   Socioeconomic History   Marital status: Widowed    Spouse name: Not on file   Number of children: 2   Years of education: Not on file   Highest education level: Some college, no degree  Occupational History   Occupation: retired  Tobacco Use   Smoking status: Former    Current packs/day: 0.00    Average packs/day: 0.5 packs/day for 50.7 years (25.3 ttl pk-yrs)    Types: Cigarettes    Start date: 74    Quit date: 04/2023    Years since quitting: 0.9   Smokeless tobacco: Never   Tobacco comments:    10 cigarettes daily- khj 01/28/2023  Vaping Use   Vaping status: Former   Quit date: 07/27/2013   Substances: Nicotine , Flavoring  Substance and Sexual Activity   Alcohol use: Yes    Alcohol/week: 2.0 standard drinks of alcohol    Types: 2 Glasses of wine per week    Comment: 1 glass of wine or beer 1-2 times per week   Drug use: Never   Sexual activity: Not Currently  Other Topics Concern   Not on file  Social History Narrative   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   1 child living and 1 deceased son dodie)   Social Drivers of Corporate investment banker Strain: Low Risk  (07/28/2023)   Overall Financial Resource Strain (CARDIA)    Difficulty of Paying Living Expenses: Not hard at all  Food Insecurity: No Food Insecurity (07/28/2023)   Hunger Vital Sign    Worried About  Running Out of Food in the Last Year: Never true    Ran Out of Food in the Last Year: Never true  Transportation Needs: No Transportation Needs (07/28/2023)   PRAPARE - Administrator, Civil Service (Medical): No    Lack of Transportation (Non-Medical): No  Physical Activity: Inactive (07/28/2023)   Exercise Vital Sign    Days of Exercise per Week: 0 days    Minutes of Exercise  per Session: 0 min  Stress: No Stress Concern Present (07/28/2023)   [employer] of Occupational Health - Occupational Stress Questionnaire    Feeling of Stress : Only a little  Social Connections: Socially Isolated (07/28/2023)   Social Connection and Isolation Panel    Frequency of Communication with Friends and Family: More than three times a week    Frequency of Social Gatherings with Friends and Family: More than three times a week    Attends Religious Services: Never    Database administrator or Organizations: No    Attends Banker Meetings: Never    Marital Status: Widowed    Allergies:  Allergies  Allergen Reactions   Penicillins Hives   Peanut-Containing Drug Products Itching and Rash    Peanuts    Metabolic Disorder Labs: Lab Results  Component Value Date   HGBA1C 5.8 (H) 01/11/2024   No results found for: PROLACTIN Lab Results  Component Value Date   CHOL 159 01/11/2024   TRIG 135 01/11/2024   HDL 50 01/11/2024   VLDL 16 02/28/2019   LDLCALC 85 01/11/2024   LDLCALC 83 07/14/2023   Lab Results  Component Value Date   TSH 1.230 05/29/2023   TSH 0.940 07/09/2022    Therapeutic Level Labs: No results found for: LITHIUM No results found for: VALPROATE No results found for: CBMZ  Current Medications: Current Outpatient Medications  Medication Sig Dispense Refill   atorvastatin  (LIPITOR) 10 MG tablet TAKE 1 TABLET(10 MG) BY MOUTH DAILY 90 tablet 4   meclizine  (ANTIVERT ) 12.5 MG tablet TAKE 1 TABLET(12.5 MG) BY MOUTH THREE TIMES DAILY AS NEEDED  FOR DIZZINESS 30 tablet 5   [START ON 04/22/2024] sertraline  (ZOLOFT ) 100 MG tablet Take 1 tablet (100 mg total) by mouth at bedtime. 30 tablet 5   traZODone  (DESYREL ) 150 MG tablet Take 1 tablet (150 mg total) by mouth at bedtime as needed for sleep. 30 tablet 3   No current facility-administered medications for this visit.     Musculoskeletal: Strength & Muscle Tone: N/A Gait & Station: N/A Patient leans: N/A  Psychiatric Specialty Exam: Review of Systems  Psychiatric/Behavioral:  Positive for sleep disturbance. Negative for agitation, behavioral problems, confusion, decreased concentration, dysphoric mood, hallucinations, self-injury and suicidal ideas. The patient is not nervous/anxious and is not hyperactive.   All other systems reviewed and are negative.   There were no vitals taken for this visit.There is no height or weight on file to calculate BMI.  General Appearance: Well Groomed  Eye Contact:  Good  Speech:  Clear and Coherent  Volume:  Normal  Mood:  good  Affect:  Appropriate, Congruent, and Full Range  Thought Process:  Coherent  Orientation:  Full (Time, Place, and Person)  Thought Content: Logical   Suicidal Thoughts:  No  Homicidal Thoughts:  No  Memory:  Immediate;   Good  Judgement:  Good  Insight:  Good  Psychomotor Activity:  Normal  Concentration:  Concentration: Good and Attention Span: Good  Recall:  Good  Fund of Knowledge: Good  Language: Good  Akathisia:  No  Handed:  Right  AIMS (if indicated): not done  Assets:  Communication Skills Desire for Improvement  ADL's:  Intact  Cognition: WNL  Sleep:  Good   Screenings: AUDIT    Flowsheet Row Clinical Support from 06/11/2022 in [facility]  Alcohol Use Disorder Identification Test Final Score (AUDIT) 8   GAD-7    Flowsheet Row Office Visit  from 01/11/2024 in [facility] Office Visit from 12/04/2023 in [facility]  Office Visit from 07/14/2023 in [facility] Office Visit from 05/29/2023 in [facility] Office Visit from 03/03/2023 in [facility]  Total GAD-7 Score 0 3 2 6 12    PHQ2-9    Flowsheet Row Office Visit from 01/11/2024 in [facility] Office Visit from 12/04/2023 in [facility] Clinical Support from 07/28/2023 in [facility] Office Visit from 07/14/2023 in [facility] Patient Outreach from 07/06/2023 in [place] [facility]  PHQ-2 Total Score 0 0 0 0 0  PHQ-9 Total Score 1 1 0 6 --   Flowsheet Row Pre-Admission Testing 45 from 04/02/2022 in [facility] PRE ADMISSION TESTING  C-SSRS RISK CATEGORY No Risk     Assessment and Plan:  Heather Hill is a 68 y.o. year old female with a history of depression,s/p hysterectomy, who presents for follow up for below.   1. MDD (major depressive disorder), recurrent, in partial remission (HCC) Acute stressors include: conflict with her sisters over her mother's will, attempted break in to her car Other stressors include: loss of her husband from [place] in 2021, loss of her son (transferred from [place]) in 2000 at age 35, financial strain,     History: transferred from [facility], was on fluoxetine  40 mg BID, Abilify  5 mg daily, lorazepam 1 mg twice a day- ran out a week prior to the visit She denies any significant mood symptoms since the last visit.  She reports better relationship with her family as well.  Will continue current dose of sertraline  to target depression.   2. Insomnia, unspecified type - +snoring. Unable to proceed with HST due to financial strain - drinks coffee two cups per day every other day    Overall stable.  Will continue current dose of trazodone  as needed for insomnia.    # history of alcohol use She reports a history  of alcohol use when her husband was alive, who also struggled with alcohol-related issues. We will continue to assess this.    Plan Continue sertraline  100 mg at night  Continue trazodone  150 mg at night as needed for insomnia Next appointment: 10/1 at 4 30, video   Past trials of medication: fluoxetine , Abilify , lorazepam     The patient demonstrates the following risk factors for suicide: Chronic risk factors for suicide include: psychiatric disorder of depression . Acute risk factors for suicide include: loss (financial, interpersonal, professional). Protective factors for this patient include: positive social support, responsibility to others (children, family), coping skills, and hope for the future. Considering these factors, the overall suicide risk at this point appears to be low. Patient is appropriate for outpatient follow up.       Collaboration of Care: Collaboration of Care: Other reviewed notes in Epic  Patient/Guardian was advised Release of Information must be obtained prior to any record release in order to collaborate their care with an outside provider. Patient/Guardian was advised if they have not already done so to contact the registration department to sign all necessary forms in order for us  to release information regarding their care.   Consent: Patient/Guardian gives verbal consent for treatment and assignment of benefits for services provided during this visit. Patient/Guardian expressed understanding and agreed to proceed.    Katheren Sleet, MD 03/23/2024, 4:56  PM

## 2024-03-22 ENCOUNTER — Other Ambulatory Visit: Payer: Self-pay | Admitting: Psychiatry

## 2024-03-23 ENCOUNTER — Encounter: Payer: Self-pay | Admitting: Psychiatry

## 2024-03-23 ENCOUNTER — Telehealth (INDEPENDENT_AMBULATORY_CARE_PROVIDER_SITE_OTHER): Admitting: Psychiatry

## 2024-03-23 DIAGNOSIS — F3341 Major depressive disorder, recurrent, in partial remission: Secondary | ICD-10-CM | POA: Diagnosis not present

## 2024-03-23 DIAGNOSIS — G4700 Insomnia, unspecified: Secondary | ICD-10-CM

## 2024-03-23 MED ORDER — SERTRALINE HCL 100 MG PO TABS: 100.0000 mg | ORAL_TABLET | Freq: Every day | ORAL | 5 refills | Status: DC

## 2024-03-23 NOTE — Patient Instructions (Signed)
 Continue sertraline  100 mg at night  Continue trazodone  150 mg at night as needed for insomnia Next appointment: 10/1 at 4 30

## 2024-04-08 ENCOUNTER — Telehealth: Payer: Self-pay

## 2024-04-08 NOTE — Telephone Encounter (Signed)
 pharmacy fax requesting a refill on the sertraline . states rx states not to fill until 8-22 but pt is out now. pt was last seen on 7-30 next appt 10-1

## 2024-04-08 NOTE — Telephone Encounter (Signed)
 Pharmacy notified.

## 2024-04-08 NOTE — Telephone Encounter (Signed)
 Please advise them to fill the one today

## 2024-05-06 DIAGNOSIS — H43393 Other vitreous opacities, bilateral: Secondary | ICD-10-CM | POA: Diagnosis not present

## 2024-05-06 DIAGNOSIS — H43813 Vitreous degeneration, bilateral: Secondary | ICD-10-CM | POA: Diagnosis not present

## 2024-05-06 DIAGNOSIS — H2513 Age-related nuclear cataract, bilateral: Secondary | ICD-10-CM | POA: Diagnosis not present

## 2024-05-06 IMAGING — MG DIGITAL DIAGNOSTIC BILAT W/ TOMO W/ CAD
8 of 15 series · 8 of 40 positions shown · non-contrast
Comparison: Previous CT dated January 22, 2022.

CLINICAL DATA: LEFT axillary lymphadenopathy noted on recent lung
cancer screening CT. Patient denies any history of recent
vaccination or LEFT-sided trauma.

EXAM:
DIGITAL DIAGNOSTIC BILATERAL MAMMOGRAM WITH TOMOSYNTHESIS AND CAD;
US AXILLARY LEFT
TECHNIQUE: Bilateral digital diagnostic mammography and breast tomosynthesis
was performed. The images were evaluated with computer-aided
detection.; Targeted ultrasound examination of the left axilla was
performed.

[L CC]
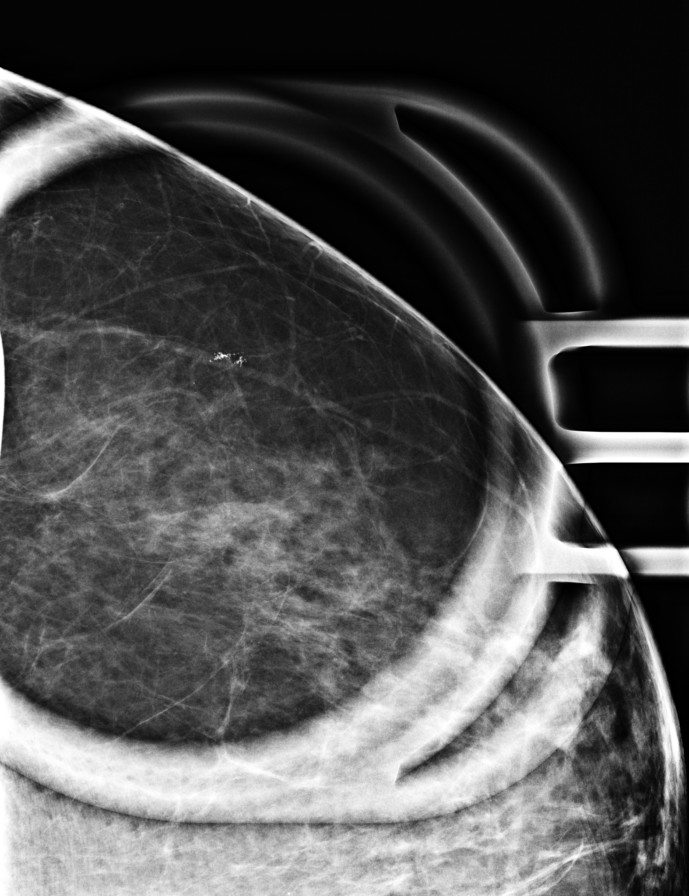

[L ML]
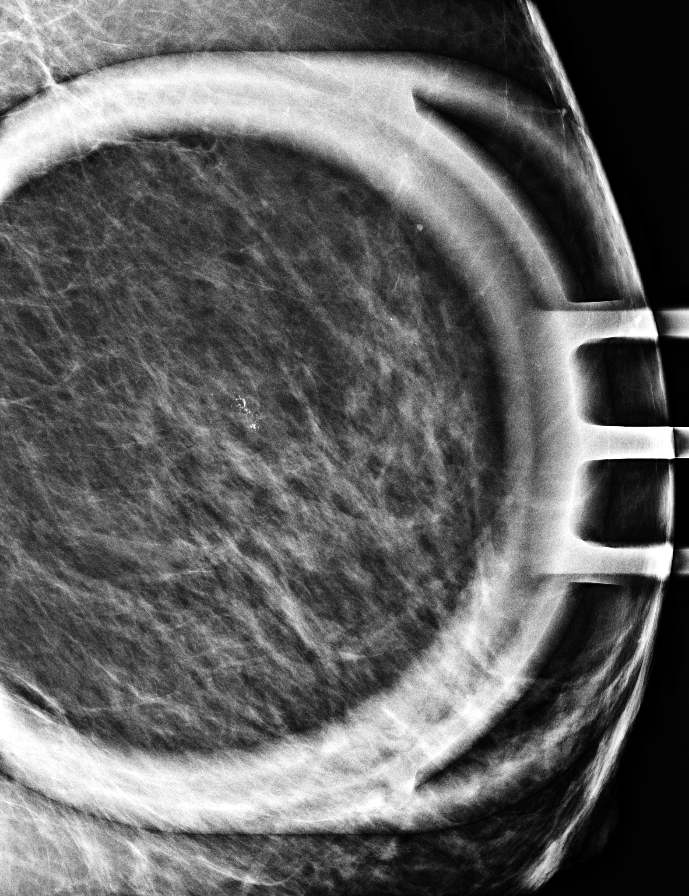

[R CC synth-2D]
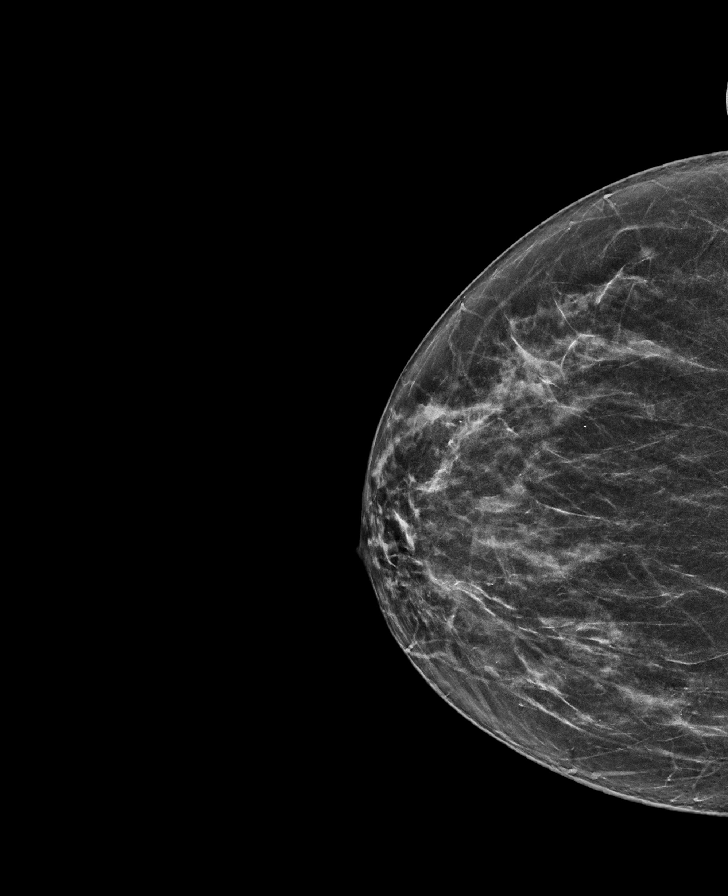

[L MLO synth-2D (1 of 2)]
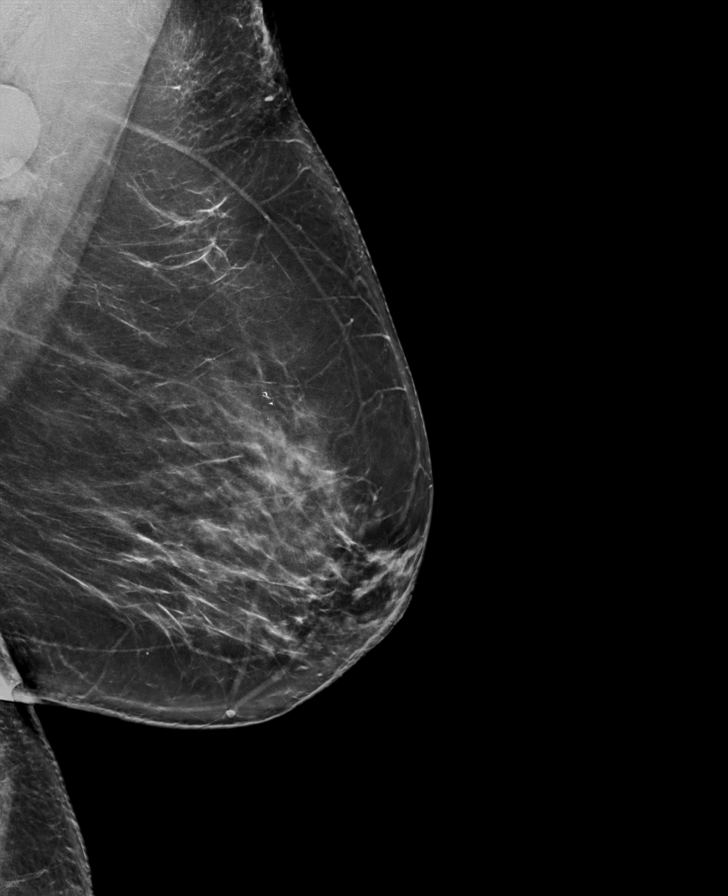

[R MLO synth-2D]
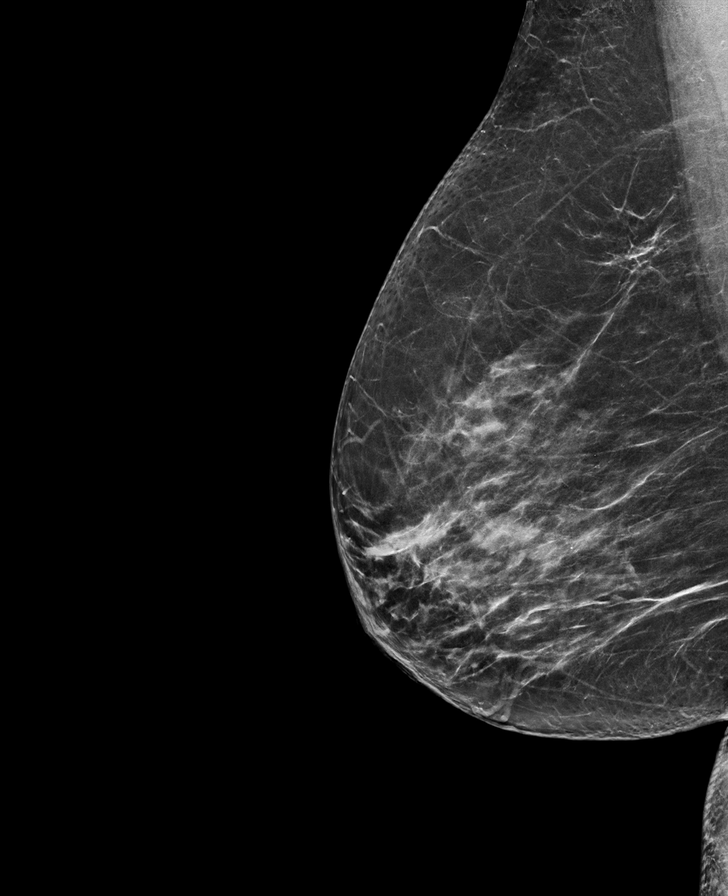

[L MLO synth-2D (2 of 2)]
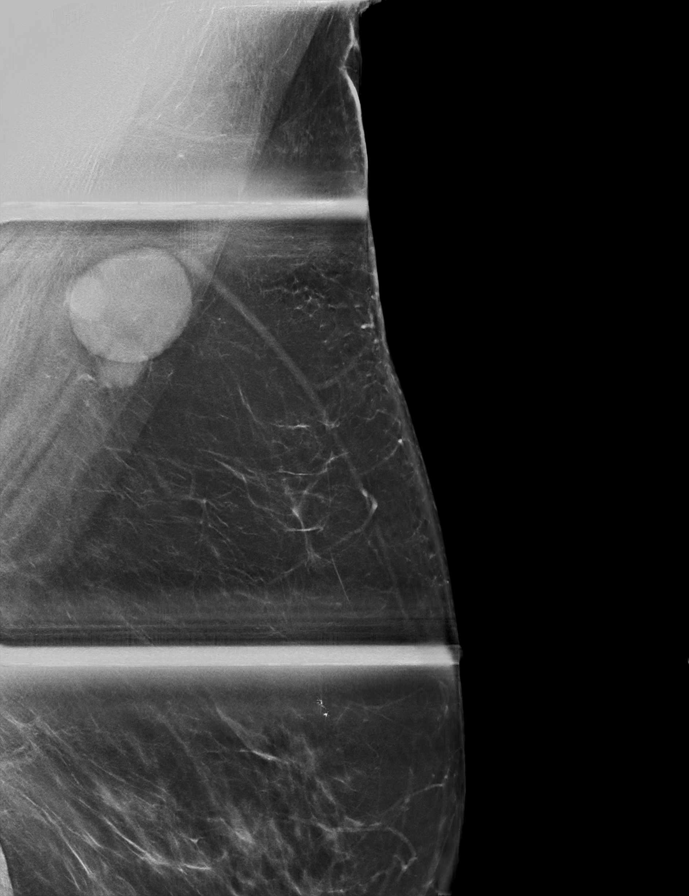

[L ML synth-2D]
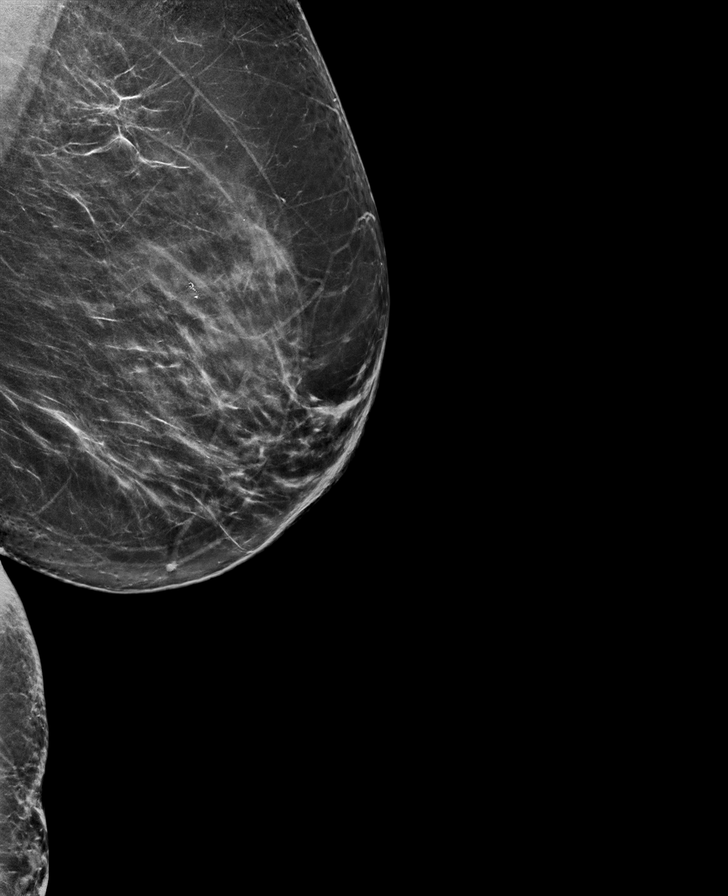

[L CC synth-2D]
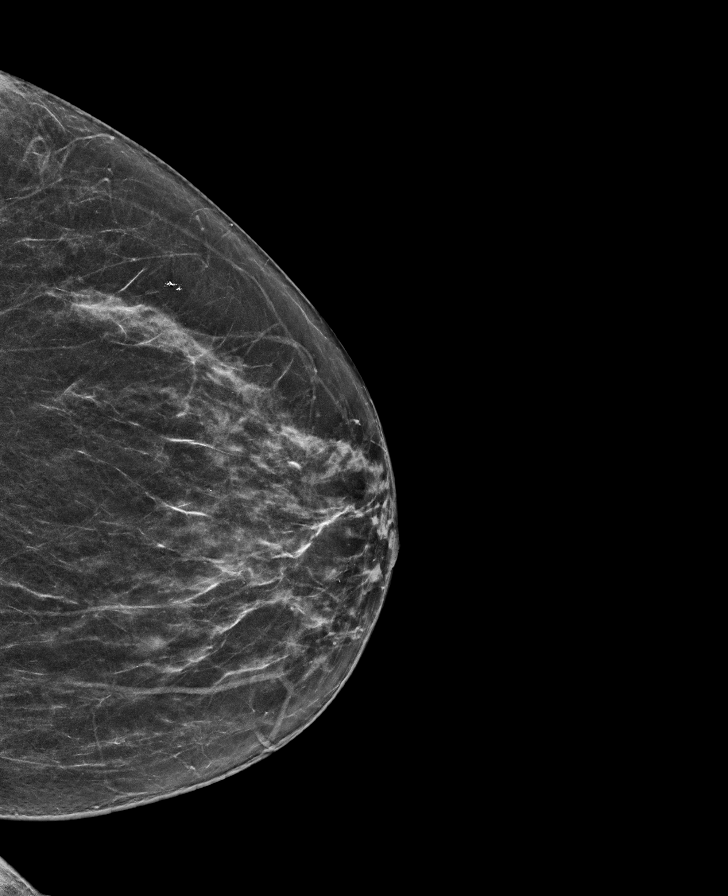

[8 of 40 positions shown; findings below may reference images not displayed]

Remote mammogram dated
October 05, 2001

ACR Breast Density Category c: The breast tissue is heterogeneously
dense, which may obscure small masses.
FINDINGS: Diagnostic images of the LEFT breast demonstrate enlarged LEFT
axillary lymph nodes. There is a dominant rounded LEFT axillary oval
mass, likely a lymph node, measuring approximately 29 mm. Spot
magnification views of the LEFT breast demonstrate a 9 mm group of
coarse heterogeneous calcifications in the LEFT upper outer breast
at middle depth. No additional suspicious findings are noted in the
LEFT breast. No suspicious mass, distortion, or microcalcifications
are identified to suggest presence of malignancy in the RIGHT
breast.

Targeted ultrasound was performed of the LEFT axilla. In the LEFT
axillary tail, there is an oval hypoechoic mass with circumscribed
margins. It measures 2.8 x 1.7 by 1.8 cm. It is not demonstrate a
discrete fatty hilum. This corresponds to the dominant mass/favored
lymph node noted on prior CT and mammogram.

Immediately adjacent is an additional similar-appearing oval
hypoechoic mass which measures 10 x 7 by 6 mm. This does not
demonstrate a normal echogenic hilum. There is an additional
immediately adjacent lymph node which demonstrates cortical
thickening of approximately 3-4 mm with a preserved echogenic hila.
IMPRESSION: 1. There is a 28 mm mass in the LEFT axilla which likely reflects an
abnormal lymph node. Recommend ultrasound-guided biopsy for
definitive characterization and to evaluate for underlying
lymphoproliferative process.
2. Adjacent to the 28 mm mass, there are 2 additional abnormal
appearing lymph nodes. Recommend management of these lymph nodes be
based upon biopsy results.
3. A 9 mm group of coarse heterogeneous calcifications in the LEFT
upper outer breast are indeterminate. They are unlikely to be
related to LEFT axillary adenopathy. Recommend stereotactic guided
biopsy for definitive characterization.
4. No mammographic evidence of malignancy in the RIGHT breast.

RECOMMENDATION:
1. LEFT breast ultrasound-guided biopsy x1
2. LEFT breast stereotactic guided biopsy x1

I have discussed the findings and recommendations with the patient.
The biopsy procedure was discussed with the patient and questions
were answered. Patient expressed their understanding of the biopsy
recommendation. Patient will be scheduled for biopsy at her earliest
convenience by the schedulers. Ordering provider will be notified.
If applicable, a reminder letter will be sent to the patient
regarding the next appointment.

BI-RADS CATEGORY  4: Suspicious.

## 2024-05-21 NOTE — Progress Notes (Unsigned)
 Virtual Visit via Video Note  I connected with Heather Hill on 05/25/24 at  4:30 PM EDT by a video enabled telemedicine application and verified that I am speaking with the correct person using two identifiers.  Location: Patient: home Provider: home office Persons participated in the visit- patient, provider    I discussed the limitations of evaluation and management by telemedicine and the availability of in person appointments. The patient expressed understanding and agreed to proceed.   I discussed the assessment and treatment plan with the patient. The patient was provided an opportunity to ask questions and all were answered. The patient agreed with the plan and demonstrated an understanding of the instructions.   The patient was advised to call back or seek an in-person evaluation if the symptoms worsen or if the condition fails to improve as anticipated.   Katheren Sleet, MD    [facility] MD/PA/NP OP Progress Note  05/25/2024 5:00 PM Heather Hill  MRN:  [id number]  Chief Complaint:  Chief Complaint  Patient presents with   Follow-up   HPI:  This is a follow-up appointment for depression, insomnia.  She states that everything is great since she quit her job.  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  Although she did although she could, this person was yelling.  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  She has been cleaning the room including her son's room.  Her daughter will be moving back next year as she is not working anymore.  Her daughter has 2 sons with autism as well.  She denies feeling depressed.  She reports slightly decrease in appetite since quitting her job.  She denies anxiety.  She denies SI, HI, hallucinations.  She sleeps well with trazodone .  She agrees with the plans as outlined below.    Substance use   Tobacco Alcohol Other substances/  Current Not since August, on nicotine  patch Two  drinks several days ago Used to drink Two glasses of vodka twice a week or less denies  Past  1/2 PPD Two glasses of vodka every night (denied DUI/DWI) denies  Past Treatment           Support: Household: by herself Marital status: widow Number of children: 1 daughter (her son deceased at age 79 in 2, transferred back from [place]), 3 grandchildren (2 autistic grandsons) Employment: CNA part time, [employer], 3 days a week Education: 12th grade, [employer] for two years    Visit Diagnosis:    ICD-10-CM   1. MDD (major depressive disorder), recurrent, in partial remission  F33.41     2. Insomnia, unspecified type  G47.00       Past Psychiatric History: Please see initial evaluation for full details. I have reviewed the history. No updates at this time.     Past Medical History:  Past Medical History:  Diagnosis Date   Anxiety    Depression    Sleep apnea    Vertigo    none for over 8 yrs   Wears dentures    partial upper    Past Surgical History:  Procedure Laterality Date   AXILLARY LYMPH NODE BIOPSY Left 04/10/2022   Procedure: AXILLARY LYMPH NODE BIOPSY, excisional;  Surgeon: Desiderio Schanz, MD;  Location: [facility] ORS;  Service: General;  Laterality: Left;   BREAST BIOPSY Left 03/06/2022   stereo bx, calcs, COIL clip benign   BREAST  EXCISIONAL BIOPSY Left 03/2022   excision of lymph node-benign   COLONOSCOPY WITH PROPOFOL  N/A 02/02/2019   Procedure: COLONOSCOPY WITH PROPOFOL ;  Surgeon: Unk Corinn Skiff, MD;  Location: [facility];  Service: Endoscopy;  Laterality: N/A;   POLYPECTOMY  02/02/2019   Procedure: POLYPECTOMY;  Surgeon: Unk Corinn Skiff, MD;  Location: [facility];  Service: Endoscopy;;   TOTAL ABDOMINAL HYSTERECTOMY     TUBAL LIGATION      Family Psychiatric History: Please see initial evaluation for full details. I have reviewed the history. No updates at this time.     Family History:  Family History  Problem Relation Age of  Onset   Diabetes Mother    Alzheimer's disease Mother    Hypertension Father    Asthma Father    Diabetes Father    Bipolar disorder Sister    Diabetes Sister    Diabetes Brother    Breast cancer Maternal Aunt    Diabetes Maternal Grandfather    Alzheimer's disease Maternal Grandfather    Heart disease Maternal Grandfather    Asthma Paternal Grandfather    Diabetes Paternal Grandfather    Hypertension Paternal Grandfather     Social History:  Social History   Socioeconomic History   Marital status: Widowed    Spouse name: Not on file   Number of children: 2   Years of education: Not on file   Highest education level: Some college, no degree  Occupational History   Occupation: retired  Tobacco Use   Smoking status: Former    Current packs/day: 0.00    Average packs/day: 0.5 packs/day for 50.7 years (25.3 ttl pk-yrs)    Types: Cigarettes    Start date: 32    Quit date: 04/2023    Years since quitting: 1.0   Smokeless tobacco: Never   Tobacco comments:    10 cigarettes daily- khj 01/28/2023  Vaping Use   Vaping status: Former   Quit date: 07/27/2013   Substances: Nicotine , Flavoring  Substance and Sexual Activity   Alcohol use: Yes    Alcohol/week: 2.0 standard drinks of alcohol    Types: 2 Glasses of wine per week    Comment: 1 glass of wine or beer 1-2 times per week   Drug use: Never   Sexual activity: Not Currently  Other Topics Concern   Not on file  Social History Narrative   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   1 child living and 1 deceased son dodie)   Social Drivers of Corporate investment banker Strain: Low Risk  (07/28/2023)   Overall Financial Resource Strain (CARDIA)    Difficulty of Paying Living Expenses: Not hard at all  Food Insecurity: No Food Insecurity (07/28/2023)   Hunger Vital Sign    Worried About Running Out of Food in the Last Year: Never true    Ran Out of Food in the Last Year: Never true  Transportation  Needs: No Transportation Needs (07/28/2023)   PRAPARE - Administrator, Civil Service (Medical): No    Lack of Transportation (Non-Medical): No  Physical Activity: Inactive (07/28/2023)   Exercise Vital Sign    Days of Exercise per Week: 0 days    Minutes of Exercise per Session: 0 min  Stress: No Stress Concern Present (07/28/2023)   [employer] of Occupational Health - Occupational Stress Questionnaire    Feeling of Stress : Only a little  Social Connections: Socially Isolated (  07/28/2023)   Social Connection and Isolation Panel    Frequency of Communication with Friends and Family: More than three times a week    Frequency of Social Gatherings with Friends and Family: More than three times a week    Attends Religious Services: Never    Database administrator or Organizations: No    Attends Banker Meetings: Never    Marital Status: Widowed    Allergies:  Allergies  Allergen Reactions   Penicillins Hives   Peanut-Containing Drug Products Itching and Rash    Peanuts    Metabolic Disorder Labs: Lab Results  Component Value Date   HGBA1C 5.8 (H) 01/11/2024   No results found for: PROLACTIN Lab Results  Component Value Date   CHOL 159 01/11/2024   TRIG 135 01/11/2024   HDL 50 01/11/2024   VLDL 16 02/28/2019   LDLCALC 85 01/11/2024   LDLCALC 83 07/14/2023   Lab Results  Component Value Date   TSH 1.230 05/29/2023   TSH 0.940 07/09/2022    Therapeutic Level Labs: No results found for: LITHIUM No results found for: VALPROATE No results found for: CBMZ  Current Medications: Current Outpatient Medications  Medication Sig Dispense Refill   atorvastatin  (LIPITOR) 10 MG tablet TAKE 1 TABLET(10 MG) BY MOUTH DAILY 90 tablet 4   meclizine  (ANTIVERT ) 12.5 MG tablet TAKE 1 TABLET(12.5 MG) BY MOUTH THREE TIMES DAILY AS NEEDED FOR DIZZINESS 30 tablet 5   sertraline  (ZOLOFT ) 100 MG tablet Take 1 tablet (100 mg total) by mouth at bedtime. 30  tablet 5   traZODone  (DESYREL ) 150 MG tablet Take 1 tablet (150 mg total) by mouth at bedtime as needed for sleep. 30 tablet 3   No current facility-administered medications for this visit.     Musculoskeletal: Strength & Muscle Tone: N/A Gait & Station: N/A Patient leans: N/A  Psychiatric Specialty Exam: Review of Systems  Psychiatric/Behavioral:  Negative for agitation, behavioral problems, confusion, decreased concentration, dysphoric mood, hallucinations, self-injury, sleep disturbance and suicidal ideas. The patient is not nervous/anxious and is not hyperactive.   All other systems reviewed and are negative.   There were no vitals taken for this visit.There is no height or weight on file to calculate BMI.  General Appearance: Well Groomed  Eye Contact:  Good  Speech:  Clear and Coherent  Volume:  Normal  Mood:  good  Affect:  Appropriate, Congruent, and Full Range  Thought Process:  Coherent  Orientation:  Full (Time, Place, and Person)  Thought Content: Logical   Suicidal Thoughts:  No  Homicidal Thoughts:  No  Memory:  Immediate;   Good  Judgement:  Good  Insight:  Good  Psychomotor Activity:  Normal  Concentration:  Concentration: Good and Attention Span: Good  Recall:  Good  Fund of Knowledge: Good  Language: Good  Akathisia:  No  Handed:  Right  AIMS (if indicated): not done  Assets:  Communication Skills Desire for Improvement  ADL's:  Intact  Cognition: WNL  Sleep:  Good   Screenings: AUDIT    Flowsheet Row Clinical Support from 06/11/2022 in [facility]  Alcohol Use Disorder Identification Test Final Score (AUDIT) 8   GAD-7    Flowsheet Row Office Visit from 01/11/2024 in [facility] Office Visit from 12/04/2023 in [facility] Office Visit from 07/14/2023 in [facility] Office Visit from 05/29/2023 in [facility] Office Visit from  03/03/2023 in [facility]  Total GAD-7 Score 0 3 2 6 12    PHQ2-9    Flowsheet Row Office Visit from 01/11/2024 in [facility] Office Visit from 12/04/2023 in [facility] Clinical Support from 07/28/2023 in [facility] Office Visit from 07/14/2023 in [facility] Patient Outreach from 07/06/2023 in [place] [facility]  PHQ-2 Total Score 0 0 0 0 0  PHQ-9 Total Score 1 1 0 6 --   Flowsheet Row Pre-Admission Testing 45 from 04/02/2022 in [facility] PRE ADMISSION TESTING  C-SSRS RISK CATEGORY No Risk     Assessment and Plan:  Heather Hill is a 68 y.o. year old female with a history of depression,s/p hysterectomy, who presents for follow up for below.   1. MDD (major depressive disorder), recurrent, in partial remission Acute stressors include: conflict with her sisters over her mother's will, attempted break in to her car Other stressors include: loss of her husband from MI in 2021, loss of her son (transferred from [place]) in 2000 at age 16, financial strain,     History: transferred from [facility], was on fluoxetine  40 mg BID, Abilify  5 mg daily, lorazepam 1 mg twice a day- ran out a week prior to the visit She denies any significant mood symptoms since the last visit.  She is preparing for her daughter and her family to be moving in next year.  Will continue current dose of sertraline  to target depression.   2. Insomnia, unspecified type - +snoring. Unable to proceed with HST due to financial strain - drinks coffee two cups per day every other day     Stable.  Will continue current dose of trazodone  as needed for insomnia.    # history of alcohol use She reports a history of alcohol use when her husband was alive, who also struggled with alcohol-related issues. We will continue to assess this.    Plan Continue  sertraline  100 mg at night  Continue trazodone  150 mg at night as needed for insomnia- refills left per patient Next appointment: 1/7 at 4 30, video   Past trials of medication: fluoxetine , Abilify , lorazepam     The patient demonstrates the following risk factors for suicide: Chronic risk factors for suicide include: psychiatric disorder of depression . Acute risk factors for suicide include: loss (financial, interpersonal, professional). Protective factors for this patient include: positive social support, responsibility to others (children, family), coping skills, and hope for the future. Considering these factors, the overall suicide risk at this point appears to be low. Patient is appropriate for outpatient follow up.     Collaboration of Care: Collaboration of Care: Other reviewed notes in epic  Patient/Guardian was advised Release of Information must be obtained prior to any record release in order to collaborate their care with an outside provider. Patient/Guardian was advised if they have not already done so to contact the registration department to sign all necessary forms in order for us  to release information regarding their care.   Consent: Patient/Guardian gives verbal consent for treatment and assignment of benefits for services provided during this visit. Patient/Guardian expressed understanding and agreed to proceed.    Katheren Sleet, MD 05/25/2024, 5:00 PM

## 2024-05-25 ENCOUNTER — Encounter: Payer: Self-pay | Admitting: Psychiatry

## 2024-05-25 ENCOUNTER — Telehealth: Admitting: Psychiatry

## 2024-05-25 DIAGNOSIS — G4700 Insomnia, unspecified: Secondary | ICD-10-CM

## 2024-05-25 DIAGNOSIS — F3341 Major depressive disorder, recurrent, in partial remission: Secondary | ICD-10-CM | POA: Diagnosis not present

## 2024-05-25 NOTE — Patient Instructions (Signed)
 Continue sertraline  100 mg at night  Continue trazodone  150 mg at night as needed for insomnia Next appointment: 1/7 at 4 30

## 2024-07-17 NOTE — Patient Instructions (Signed)
 Please call to schedule your mammogram and/or bone density: [facility]  Address: [address], [place], [place] [address] Phone: [phone]  [facility] [address] [place],  [place]  [address] Phone: [phone]    Prediabetes: What to Know Prediabetes is when your blood sugar, also called glucose, is at a higher level than normal but not high enough for you to be diagnosed with type 2 diabetes (type 2 diabetes mellitus). Having prediabetes puts you at risk for getting type 2 diabetes. By making some healthy changes, you may be able to prevent or delay getting type 2 diabetes. This is important because type 2 diabetes can lead to serious problems. Some of these include: Heart disease. Stroke. Blindness. Kidney disease. Depression. Poor blood flow in the feet and legs. In very bad cases, this could lead to having a leg removed by surgery (amputation). What are the causes? The exact cause of prediabetes isn't known. It may result from insulin resistance. Insulin resistance happens when cells in the body don't respond properly to insulin that the body makes. This can cause too much sugar to build up in the blood. High blood sugar, also called hyperglycemia, can develop. What increases the risk? Having a family member with type 2 diabetes. Being older than 68 years of age. Having had a temporary form of diabetes during a pregnancy. This is called gestational diabetes. Having had polycystic ovary syndrome (PCOS). Being overweight or obese. Being inactive and not getting much exercise. Having a history of heart disease. This may include problems with cholesterol levels, high levels of blood fats, or high blood pressure. What are the signs or symptoms? You may have no symptoms. If you do have symptoms, they may include: Increased hunger. Increased thirst. Needing to pee more often. Changes in how you  see, like blurry vision. Feeling tired. How is this diagnosed? Prediabetes can be diagnosed with blood tests that check your blood sugar. One or more of these tests may be done: A fasting blood glucose (FBG) test. You won't be allowed to eat (you will fast) for at least 8 hours before a blood sample is taken. An A1C blood test, also called a hemoglobin A1C test. This test shows information about blood sugar levels over the past 2?3 months. An oral glucose tolerance test (OGTT). This test measures your blood sugar at two points in time: After you haven't eaten for a while. This is your baseline level. Two hours after you drink a beverage that has sugar in it. You may be diagnosed with prediabetes if: Your FBG is 100?125 mg/dL (4.3-3.0 mmol/L). Your A1C level is 5.7?6.4% (39-46 mmol/mol). Your OGTT result is 140?199 mg/dL (2.1-88 mmol/L). These blood tests may need to be done again to be sure of the diagnosis. How is this treated? Treatment may include making changes to your diet and lifestyle. These changes can help lower your blood sugar and keep you from getting type 2 diabetes. In some cases, medicine may be given to help lower your risk. Follow these instructions at home: Eating and drinking  Eat and drink as told. Follow a healthy meal plan. This includes eating lean proteins, whole grains, legumes, fresh fruits and vegetables, low-fat dairy products, and healthy fats. Meet with an expert in healthy eating called a dietitian. This person can help create a healthy eating plan that's right for you. Lifestyle Do moderate-intensity exercise. Do this for at least 30 minutes  a day on 5 or more days each week, or as told by your health care provider. A mix of activities may be best. Good choices include brisk walking, swimming, biking, and weight lifting. Try to lose weight if your provider says it's OK. Losing 5-7% of your body weight can help reverse insulin resistance. Do not drink alcohol  if: Your provider tells you not to drink. You're pregnant, may be pregnant, or plan to become pregnant. If you drink alcohol: Limit how much you have to: 0-1 drink a day if you're female. 0-2 drinks a day if you're female. Know how much alcohol is in your drink. In the [place] one drink is one 12 oz bottle of beer (355 mL), one 5 oz glass of wine (148 mL), or one 1 oz glass of hard liquor (44 mL). General instructions Take medicines only as told. You may be given medicines that help lower the risk of type 2 diabetes. Do not smoke, vape, or use nicotine  or tobacco. Where to find more information American Diabetes Association: diabetes.org/about-diabetes/prediabetes Academy of Nutrition and Dietetics: eatright.org American Heart Association: Go to thisjobs.cz. Click the search icon. Type prediabetes in the search box. Contact a health care provider if: You have any of these symptoms: Increased hunger. Peeing more often than usual. Increased thirst. Feeling tired. Changes in how you see, like blurry vision. Feeling like you may throw up. Throwing up. Get help right away if: You have shortness of breath. You feel confused. This information is not intended to replace advice given to you by your health care provider. Make sure you discuss any questions you have with your health care provider. Document Revised: 03/15/2023 Document Reviewed: 03/15/2023 Elsevier Patient Education  2024 [employer].

## 2024-07-19 ENCOUNTER — Ambulatory Visit (INDEPENDENT_AMBULATORY_CARE_PROVIDER_SITE_OTHER): Admitting: Nurse Practitioner

## 2024-07-19 ENCOUNTER — Encounter: Payer: Self-pay | Admitting: Nurse Practitioner

## 2024-07-19 VITALS — BP 107/68 | HR 76 | Temp 98.6°F | Resp 15 | Ht 65.0 in | Wt 188.0 lb

## 2024-07-19 DIAGNOSIS — I700 Atherosclerosis of aorta: Secondary | ICD-10-CM

## 2024-07-19 DIAGNOSIS — R7301 Impaired fasting glucose: Secondary | ICD-10-CM

## 2024-07-19 DIAGNOSIS — F331 Major depressive disorder, recurrent, moderate: Secondary | ICD-10-CM

## 2024-07-19 DIAGNOSIS — Z1231 Encounter for screening mammogram for malignant neoplasm of breast: Secondary | ICD-10-CM

## 2024-07-19 DIAGNOSIS — E6609 Other obesity due to excess calories: Secondary | ICD-10-CM

## 2024-07-19 DIAGNOSIS — Z0000 Encounter for general adult medical examination without abnormal findings: Secondary | ICD-10-CM

## 2024-07-19 DIAGNOSIS — E559 Vitamin D deficiency, unspecified: Secondary | ICD-10-CM

## 2024-07-19 DIAGNOSIS — R42 Dizziness and giddiness: Secondary | ICD-10-CM

## 2024-07-19 DIAGNOSIS — J432 Centrilobular emphysema: Secondary | ICD-10-CM | POA: Diagnosis not present

## 2024-07-19 DIAGNOSIS — F17210 Nicotine dependence, cigarettes, uncomplicated: Secondary | ICD-10-CM

## 2024-07-19 DIAGNOSIS — Z23 Encounter for immunization: Secondary | ICD-10-CM

## 2024-07-19 DIAGNOSIS — E782 Mixed hyperlipidemia: Secondary | ICD-10-CM

## 2024-07-19 DIAGNOSIS — E66811 Obesity, class 1: Secondary | ICD-10-CM

## 2024-07-19 LAB — BAYER DCA HB A1C WAIVED: HB A1C (BAYER DCA - WAIVED): 5.8 % — ABNORMAL HIGH (ref 4.8–5.6)

## 2024-07-19 MED ORDER — ROSUVASTATIN CALCIUM 10 MG PO TABS: 10.0000 mg | ORAL_TABLET | Freq: Every day | ORAL | 3 refills | Status: AC

## 2024-07-19 MED ORDER — MECLIZINE HCL 12.5 MG PO TABS: 12.5000 mg | ORAL_TABLET | Freq: Three times a day (TID) | ORAL | 5 refills | Status: AC | PRN

## 2024-07-19 NOTE — Assessment & Plan Note (Signed)
 Chronic, ongoing.  Recommend she trial Crestor  and see if tolerates  Recommend cut back on red meat, cheese, processed foods.  Complete cessation of smoking recommended.

## 2024-07-19 NOTE — Assessment & Plan Note (Signed)
 Chronic, ongoing.  Denies SI/HI.  Does have a safety plan in place if intrusive thoughts were to present, would reach out to daughter or go to ER.  Currently followed by psychiatry, Dr. Vanetta Shawl.  Continue current regimen at this time and defer changes to psychiatry.

## 2024-07-19 NOTE — Assessment & Plan Note (Signed)
 BMI 31.28, with weight loss present. She has been working on diet.  Recommended eating smaller high protein, low fat meals more frequently and exercising 30 mins a day 5 times a week with a goal of 10-15lb weight loss in the next 3 months. Patient voiced their understanding and motivation to adhere to these recommendations.

## 2024-07-19 NOTE — Assessment & Plan Note (Signed)
 Ongoing.  A1c today remains stable at 5.8%, ?if Abilify  in past affected levels (was 6.6% at one point) as levels now improved without this on board.  Recommend ongoing diet and exercise focus.  We discussed plan of care.

## 2024-07-19 NOTE — Assessment & Plan Note (Signed)
 I have recommended complete cessation of tobacco use. I have discussed various options available for assistance with tobacco cessation including over the counter methods (Nicotine gum, patch and lozenges). We also discussed prescription options (Chantix, Nicotine Inhaler / Nasal Spray). The patient is not interested in pursuing any prescription tobacco cessation options at this time.

## 2024-07-19 NOTE — Assessment & Plan Note (Signed)
Ongoing, noted on past labs. Recheck today and recommend she continue Vitamin D3 2000 units daily.  DEXA was normal.

## 2024-07-19 NOTE — Assessment & Plan Note (Signed)
 Chronic, noted on imaging 01/04/22.  Take Baby ASA 81 MG daily. Recommend she trial Crestor  and see if tolerates. Educated on this.

## 2024-07-19 NOTE — Assessment & Plan Note (Signed)
 Chronic, noted on initial CT lung screening 01/22/22.  At this time no inhalers or symptoms reported.  Will plan on spirometry at next visit to further assess, discussed with patient.  She agrees with plan of care.  Initiate inhalers as needed.  Recommend complete cessation of smoking.  Continue yearly lung screening.

## 2024-07-19 NOTE — Progress Notes (Signed)
 BP 107/68 (BP Location: Left Arm, Patient Position: Sitting, Cuff Size: Normal)   Pulse 76   Temp 98.6 F (37 C) (Oral)   Resp 15   Ht 5' 5 (1.651 m)   Wt 188 lb (85.3 kg)   SpO2 98%   BMI 31.28 kg/m    Subjective:    Patient ID: Heather Hill, female    DOB: February 25, 1956, 68 y.o.   MRN: [id number]  HPI: Heather Hill is a 68 y.o. female presenting on 07/19/2024 for comprehensive medical examination. Current medical complaints include:none  She currently lives with: self Menopausal Symptoms: no  COPD Last lung screening on 01/27/24, continues to note emphysema and aortic atherosclerosis.  Low Vitamin D  levels, has forgotten to take supplement = DEXA normal. Smokes 1/2 PPD, started back this year due to stressors after mother passed. COPD status: stable Satisfied with current treatment?: yes Oxygen use: no Dyspnea frequency: no Cough frequency: no Rescue inhaler frequency:  none Limitation of activity: no Productive cough: none Last Spirometry: none Pneumovax: Up to Date Influenza: Up to Date   HYPERLIPIDEMIA Quit taking Atorvastatin  due to recall info on news. Hyperlipidemia status: poor compliance Satisfied with current treatment?  no Side effects:  no Medication compliance: poor compliance Supplements: none Aspirin:  no The 10-year ASCVD risk score (Arnett DK, et al., 2019) is: 5.7%   Values used to calculate the score:     Age: 48 years     Clincally relevant sex: Female     Is Non-Hispanic African American: Yes     Diabetic: No     Tobacco smoker: No     Systolic Blood Pressure: 107 mmHg     Is BP treated: No     HDL Cholesterol: 50 mg/dL     Total Cholesterol: 159 mg/dL Chest pain:  no Coronary artery disease:  no Family history CAD:  yes little sister had an MI Family history early CAD:  no   Impaired Fasting Glucose HbA1C:  Lab Results  Component Value Date   HGBA1C 5.8 (H) 07/19/2024  Duration of elevated blood sugar:  chronic Polydipsia: no Polyuria: no Weight change: no Visual disturbance: no Glucose Monitoring: no    Accucheck frequency: Not Checking    Fasting glucose:     Post prandial:  Diabetic Education: Not Completed Family history of diabetes: yes   DEPRESSION Saw psychiatry last on 05/25/24.  Continues on Zoloft  and Trazodone .  Currently no therapy. Mood overall improved since stopping caring for a   Past medications: Fluoxetine , Abilify , Lorazepam  Mood status: stable Satisfied with current treatment?: yes Symptom severity: moderate  Duration of current treatment : chronic Side effects: no Medication compliance: good compliance Psychotherapy/counseling: yes in the past Depressed mood: no Anxious mood: no Anhedonia: no Significant weight loss or gain: no Insomnia: improving Fatigue: no Feelings of worthlessness or guilt: no Impaired concentration/indecisiveness: no Suicidal ideations: no Hopelessness: no Crying spells: no    07/19/2024    4:35 PM 01/11/2024    3:45 PM 12/04/2023    8:48 AM 07/28/2023    3:57 PM 07/14/2023   10:07 AM  Depression screen PHQ 2/9  Decreased Interest 0 0 0 0 0  Down, Depressed, Hopeless 0 0 0 0 0  PHQ - 2 Score 0 0 0 0 0  Altered sleeping 0 1 0 0 2  Tired, decreased energy 1 0 1 0 2  Change in appetite  0 0 0 2  Feeling bad or failure about yourself  0 0 0 0 0  Trouble concentrating 0 0 0 0 0  Moving slowly or fidgety/restless 0 0 0 0 0  Suicidal thoughts 0 0 0 0 0  PHQ-9 Score 1 1  1   0  6   Difficult doing work/chores Not difficult at all  Not difficult at all Not difficult at all Not difficult at all     Data saved with a previous flowsheet row definition      07/19/2024    4:35 PM 01/11/2024    3:46 PM 12/04/2023    8:49 AM 07/14/2023   10:08 AM  GAD 7 : Generalized Anxiety Score  Nervous, Anxious, on Edge 0 0 1 2  Control/stop worrying 0 0 1 0  Worry too much - different things 0 0 0 0  Trouble relaxing 0 0 1 0  Restless 0 0  0 0  Easily annoyed or irritable 0 0 0 0  Afraid - awful might happen 0 0 0 0  Total GAD 7 Score 0 0 3 2  Anxiety Difficulty Not difficult at all  Not difficult at all Not difficult at all      02/03/2023    8:29 AM 07/14/2023   10:07 AM 07/28/2023    4:01 PM 12/04/2023    8:48 AM 01/11/2024    3:45 PM  Fall Risk  Falls in the past year? 0 0 0 0 0  Was there an injury with Fall? 0 0 0 0 0  Fall Risk Category Calculator 0 0 0 0 0  Patient at Risk for Falls Due to No Fall Risks No Fall Risks No Fall Risks No Fall Risks No Fall Risks  Fall risk Follow up Falls evaluation completed Falls evaluation completed Falls prevention discussed Falls evaluation completed Falls evaluation completed    Past Medical History:  Past Medical History:  Diagnosis Date   Anxiety    Depression    Vertigo    none for over 8 yrs   Wears dentures    partial upper    Surgical History:  Past Surgical History:  Procedure Laterality Date   AXILLARY LYMPH NODE BIOPSY Left 04/10/2022   Procedure: AXILLARY LYMPH NODE BIOPSY, excisional;  Surgeon: Desiderio Schanz, MD;  Location: [facility];  Service: General;  Laterality: Left;   BREAST BIOPSY Left 03/06/2022   stereo bx, calcs, COIL clip benign   BREAST EXCISIONAL BIOPSY Left 03/2022   excision of lymph node-benign   COLONOSCOPY WITH PROPOFOL  N/A 02/02/2019   Procedure: COLONOSCOPY WITH PROPOFOL ;  Surgeon: Unk Corinn Skiff, MD;  Location: [facility];  Service: Endoscopy;  Laterality: N/A;   POLYPECTOMY  02/02/2019   Procedure: POLYPECTOMY;  Surgeon: Unk Corinn Skiff, MD;  Location: [facility];  Service: Endoscopy;;   TOTAL ABDOMINAL HYSTERECTOMY     TUBAL LIGATION      Medications:  Current Outpatient Medications on File Prior to Visit  Medication Sig   sertraline  (ZOLOFT ) 100 MG tablet Take 1 tablet (100 mg total) by mouth at bedtime.   traZODone  (DESYREL ) 150 MG tablet Take 1 tablet (150 mg total) by mouth at bedtime as needed  for sleep.   No current facility-administered medications on file prior to visit.    Allergies:  Allergies  Allergen Reactions   Penicillins Hives   Peanut-Containing Drug Products Itching and Rash    Peanuts    Social History:  Social History   Socioeconomic History   Marital status: Widowed    Spouse name:  Not on file   Number of children: 2   Years of education: Not on file   Highest education level: Some college, no degree  Occupational History   Occupation: retired  Tobacco Use   Smoking status: Former    Current packs/day: 0.00    Average packs/day: 0.5 packs/day for 50.7 years (25.3 ttl pk-yrs)    Types: Cigarettes    Start date: 43    Quit date: 04/2023    Years since quitting: 1.2   Smokeless tobacco: Never  Vaping Use   Vaping status: Former   Quit date: 07/27/2013   Substances: Nicotine , Flavoring  Substance and Sexual Activity   Alcohol use: Yes    Alcohol/week: 2.0 standard drinks of alcohol    Types: 2 Glasses of wine per week    Comment: 1 glass of wine or beer 1-2 times per week   Drug use: Never   Sexual activity: Not Currently  Other Topics Concern   Not on file  Social History Narrative   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   1 child living and 1 deceased son dodie)   Social Drivers of Corporate Investment Banker Strain: Low Risk  (07/28/2023)   Overall Financial Resource Strain (CARDIA)    Difficulty of Paying Living Expenses: Not hard at all  Food Insecurity: No Food Insecurity (07/28/2023)   Hunger Vital Sign    Worried About Running Out of Food in the Last Year: Never true    Ran Out of Food in the Last Year: Never true  Transportation Needs: No Transportation Needs (07/28/2023)   PRAPARE - Administrator, Civil Service (Medical): No    Lack of Transportation (Non-Medical): No  Physical Activity: Inactive (07/28/2023)   Exercise Vital Sign    Days of Exercise per Week: 0 days    Minutes of Exercise  per Session: 0 min  Stress: No Stress Concern Present (07/28/2023)   [employer] of Occupational Health - Occupational Stress Questionnaire    Feeling of Stress : Only a little  Social Connections: Socially Isolated (07/28/2023)   Social Connection and Isolation Panel    Frequency of Communication with Friends and Family: More than three times a week    Frequency of Social Gatherings with Friends and Family: More than three times a week    Attends Religious Services: Never    Database Administrator or Organizations: No    Attends Banker Meetings: Never    Marital Status: Widowed  Intimate Partner Violence: Not At Risk (07/28/2023)   Humiliation, Afraid, Rape, and Kick questionnaire    Fear of Current or Ex-Partner: No    Emotionally Abused: No    Physically Abused: No    Sexually Abused: No   Social History   Tobacco Use  Smoking Status Former   Current packs/day: 0.00   Average packs/day: 0.5 packs/day for 50.7 years (25.3 ttl pk-yrs)   Types: Cigarettes   Start date: 33   Quit date: 04/2023   Years since quitting: 1.2  Smokeless Tobacco Never   Social History   Substance and Sexual Activity  Alcohol Use Yes   Alcohol/week: 2.0 standard drinks of alcohol   Types: 2 Glasses of wine per week   Comment: 1 glass of wine or beer 1-2 times per week    Family History:  Family History  Problem Relation Age of Onset   Diabetes Mother    Alzheimer's disease  Mother    Hypertension Father    Asthma Father    Diabetes Father    Bipolar disorder Sister    Diabetes Sister    Diabetes Brother    Breast cancer Maternal Aunt    Diabetes Maternal Grandfather    Alzheimer's disease Maternal Grandfather    Heart disease Maternal Grandfather    Asthma Paternal Grandfather    Diabetes Paternal Grandfather    Hypertension Paternal Grandfather     Past medical history, surgical history, medications, allergies, family history and social history reviewed with  patient today and changes made to appropriate areas of the chart.   ROS All other ROS negative except what is listed above and in the HPI.      Objective:    BP 107/68 (BP Location: Left Arm, Patient Position: Sitting, Cuff Size: Normal)   Pulse 76   Temp 98.6 F (37 C) (Oral)   Resp 15   Ht 5' 5 (1.651 m)   Wt 188 lb (85.3 kg)   SpO2 98%   BMI 31.28 kg/m   Wt Readings from Last 3 Encounters:  07/19/24 188 lb (85.3 kg)  01/11/24 197 lb (89.4 kg)  12/21/23 195 lb 4.8 oz (88.6 kg)    Physical Exam Vitals and nursing note reviewed. Exam conducted with a chaperone present.  Constitutional:      General: She is awake. She is not in acute distress.    Appearance: She is well-developed and well-groomed. She is obese. She is not ill-appearing or toxic-appearing.  HENT:     Head: Normocephalic and atraumatic.     Right Ear: Hearing, tympanic membrane, ear canal and external ear normal. No drainage.     Left Ear: Hearing, tympanic membrane, ear canal and external ear normal. No drainage.     Nose: Nose normal.     Right Sinus: No maxillary sinus tenderness or frontal sinus tenderness.     Left Sinus: No maxillary sinus tenderness or frontal sinus tenderness.     Mouth/Throat:     Mouth: Mucous membranes are moist.     Pharynx: Oropharynx is clear. Uvula midline. No pharyngeal swelling, oropharyngeal exudate or posterior oropharyngeal erythema.  Eyes:     General: Lids are normal.        Right eye: No discharge.        Left eye: No discharge.     Extraocular Movements: Extraocular movements intact.     Conjunctiva/sclera: Conjunctivae normal.     Pupils: Pupils are equal, round, and reactive to light.     Visual Fields: Right eye visual fields normal and left eye visual fields normal.  Neck:     Thyroid : No thyromegaly.     Vascular: No carotid bruit.     Trachea: Trachea normal.  Cardiovascular:     Rate and Rhythm: Normal rate and regular rhythm.     Heart sounds: Normal  heart sounds. No murmur heard.    No gallop.  Pulmonary:     Effort: Pulmonary effort is normal. No accessory muscle usage or respiratory distress.     Breath sounds: Normal breath sounds.  Chest:  Breasts:    Right: Normal.     Left: Normal.  Abdominal:     General: Bowel sounds are normal.     Palpations: Abdomen is soft. There is no hepatomegaly or splenomegaly.     Tenderness: There is no abdominal tenderness.  Musculoskeletal:        General: Normal range of motion.  Cervical back: Normal range of motion and neck supple.     Right lower leg: No edema.     Left lower leg: No edema.  Lymphadenopathy:     Head:     Right side of head: No submental, submandibular, tonsillar, preauricular or posterior auricular adenopathy.     Left side of head: No submental, submandibular, tonsillar, preauricular or posterior auricular adenopathy.     Cervical: No cervical adenopathy.     Upper Body:     Right upper body: No supraclavicular, axillary or pectoral adenopathy.     Left upper body: No supraclavicular, axillary or pectoral adenopathy.  Skin:    General: Skin is warm and dry.     Capillary Refill: Capillary refill takes less than 2 seconds.     Findings: No rash.  Neurological:     Mental Status: She is alert and oriented to person, place, and time.     Gait: Gait is intact.     Deep Tendon Reflexes: Reflexes are normal and symmetric.     Reflex Scores:      Brachioradialis reflexes are 2+ on the right side and 2+ on the left side.      Patellar reflexes are 2+ on the right side and 2+ on the left side. Psychiatric:        Attention and Perception: Attention normal.        Mood and Affect: Mood normal.        Speech: Speech normal.        Behavior: Behavior normal. Behavior is cooperative.        Thought Content: Thought content normal.        Judgment: Judgment normal.    Results for orders placed or performed in visit on 07/19/24  Bayer DCA Hb A1c Waived   Collection  Time: 07/19/24  3:56 PM  Result Value Ref Range   HB A1C (BAYER DCA - WAIVED) 5.8 (H) 4.8 - 5.6 %      Assessment & Plan:   Problem List Items Addressed This Visit       Cardiovascular and Mediastinum   Aortic atherosclerosis   Chronic, noted on imaging 01/04/22.  Take Baby ASA 81 MG daily. Recommend she trial Crestor  and see if tolerates. Educated on this.      Relevant Medications   rosuvastatin  (CRESTOR ) 10 MG tablet     Respiratory   Centrilobular emphysema (HCC) - Primary   Chronic, noted on initial CT lung screening 01/22/22.  At this time no inhalers or symptoms reported.  Will plan on spirometry at next visit to further assess, discussed with patient.  She agrees with plan of care.  Initiate inhalers as needed.  Recommend complete cessation of smoking.  Continue yearly lung screening.      Relevant Orders   CBC with Differential/Platelet     Endocrine   IFG (impaired fasting glucose)   Ongoing.  A1c today remains stable at 5.8%, ?if Abilify  in past affected levels (was 6.6% at one point) as levels now improved without this on board.  Recommend ongoing diet and exercise focus.  We discussed plan of care.      Relevant Orders   Bayer DCA Hb A1c Waived (Completed)     Other   Vitamin D  deficiency   Ongoing, noted on past labs. Recheck today and recommend she continue Vitamin D3 2000 units daily.  DEXA was normal.      Relevant Orders   VITAMIN D  25 Hydroxy (Vit-D  Deficiency, Fractures)   Obesity   BMI 31.28, with weight loss present. She has been working on diet.  Recommended eating smaller high protein, low fat meals more frequently and exercising 30 mins a day 5 times a week with a goal of 10-15lb weight loss in the next 3 months. Patient voiced their understanding and motivation to adhere to these recommendations.       Nicotine  dependence, cigarettes, uncomplicated   I have recommended complete cessation of tobacco use. I have discussed various options available  for assistance with tobacco cessation including over the counter methods (Nicotine  gum, patch and lozenges). We also discussed prescription options (Chantix, Nicotine  Inhaler / Nasal Spray). The patient is not interested in pursuing any prescription tobacco cessation options at this time.       Moderate episode of recurrent major depressive disorder (HCC)   Chronic, ongoing.  Denies SI/HI.  Does have a safety plan in place if intrusive thoughts were to present, would reach out to daughter or go to ER.  Currently followed by psychiatry, Dr. Vickey.  Continue current regimen at this time and defer changes to psychiatry.        Relevant Orders   TSH   Hyperlipidemia   Chronic, ongoing.  Recommend she trial Crestor  and see if tolerates  Recommend cut back on red meat, cheese, processed foods.  Complete cessation of smoking recommended.      Relevant Medications   rosuvastatin  (CRESTOR ) 10 MG tablet   Other Relevant Orders   Comprehensive metabolic panel with GFR   Lipid Panel w/o Chol/HDL Ratio   Other Visit Diagnoses       Vertigo       Chronic, stable. Refill given on Meclizine  12.5mg .   Relevant Medications   meclizine  (ANTIVERT ) 12.5 MG tablet     Encounter for screening mammogram for malignant neoplasm of breast       Mammogram ordered and instructed how to schedule.   Relevant Orders   MM 3D SCREENING MAMMOGRAM BILATERAL BREAST     Encounter for annual physical exam       Annual physical today with labs and health maintenance reviewed, discussed with patient.        Follow up plan: Return in about 6 months (around 01/16/2025) for HLD, IFG, COPD, Depression.   LABORATORY TESTING:  - Pap smear: not applicable  IMMUNIZATIONS:   - Tdap: Tetanus vaccination status reviewed: last tetanus booster within 10 years. - Influenza: Up to date - Pneumovax: not applicable - Prevnar: Up to date - COVID: Up to date - HPV: Not applicable - Shingrix vaccine:  Refused  SCREENING: -Mammogram: Ordered today due next 08/10/23 - Colonoscopy: Up to date  - Bone Density: Up to date  -Hearing Test: Not applicable  -Spirometry: Refused   PATIENT COUNSELING:   Advised to take 1 mg of folate supplement per day if capable of pregnancy.   Sexuality: Discussed sexually transmitted diseases, partner selection, use of condoms, avoidance of unintended pregnancy  and contraceptive alternatives.   Advised to avoid cigarette smoking.  I discussed with the patient that most people either abstain from alcohol or drink within safe limits (<=14/week and <=4 drinks/occasion for males, <=7/weeks and <= 3 drinks/occasion for females) and that the risk for alcohol disorders and other health effects rises proportionally with the number of drinks per week and how often a drinker exceeds daily limits.  Discussed cessation/primary prevention of drug use and availability of treatment for abuse.   Diet: Encouraged to  adjust caloric intake to maintain  or achieve ideal body weight, to reduce intake of dietary saturated fat and total fat, to limit sodium intake by avoiding high sodium foods and not adding table salt, and to maintain adequate dietary potassium and calcium  preferably from fresh fruits, vegetables, and low-fat dairy products.    Stressed the importance of regular exercise  Injury prevention: Discussed safety belts, safety helmets, smoke detector, smoking near bedding or upholstery.   Dental health: Discussed importance of regular tooth brushing, flossing, and dental visits.    NEXT PREVENTATIVE PHYSICAL DUE IN 1 YEAR. Return in about 6 months (around 01/16/2025) for HLD, IFG, COPD, Depression.

## 2024-07-20 ENCOUNTER — Ambulatory Visit: Payer: Self-pay | Admitting: Nurse Practitioner

## 2024-07-20 LAB — VITAMIN D 25 HYDROXY (VIT D DEFICIENCY, FRACTURES): Vit D, 25-Hydroxy: 28.1 ng/mL — ABNORMAL LOW (ref 30.0–100.0)

## 2024-07-20 LAB — COMPREHENSIVE METABOLIC PANEL WITH GFR
ALT: 7 IU/L (ref 0–32)
AST: 13 IU/L (ref 0–40)
Albumin: 4 g/dL (ref 3.9–4.9)
Alkaline Phosphatase: 93 IU/L (ref 49–135)
BUN/Creatinine Ratio: 13 (ref 12–28)
BUN: 13 mg/dL (ref 8–27)
Bilirubin Total: 0.3 mg/dL (ref 0.0–1.2)
CO2: 24 mmol/L (ref 20–29)
Calcium: 9.7 mg/dL (ref 8.7–10.3)
Chloride: 101 mmol/L (ref 96–106)
Creatinine, Ser: 1 mg/dL (ref 0.57–1.00)
Globulin, Total: 3 g/dL (ref 1.5–4.5)
Glucose: 88 mg/dL (ref 70–99)
Potassium: 4.1 mmol/L (ref 3.5–5.2)
Sodium: 139 mmol/L (ref 134–144)
Total Protein: 7 g/dL (ref 6.0–8.5)
eGFR: 61 mL/min/1.73 (ref >59)

## 2024-07-20 LAB — CBC WITH DIFFERENTIAL/PLATELET
Basophils Absolute: 0.1 x10E3/uL (ref 0.0–0.2)
Basos: 1 %
EOS (ABSOLUTE): 0.2 x10E3/uL (ref 0.0–0.4)
Eos: 3 %
Hematocrit: 41.2 % (ref 34.0–46.6)
Hemoglobin: 13.2 g/dL (ref 11.1–15.9)
Immature Grans (Abs): 0 x10E3/uL (ref 0.0–0.1)
Immature Granulocytes: 0 %
Lymphocytes Absolute: 2.4 x10E3/uL (ref 0.7–3.1)
Lymphs: 36 %
MCH: 27.2 pg (ref 26.6–33.0)
MCHC: 32 g/dL (ref 31.5–35.7)
MCV: 85 fL (ref 79–97)
Monocytes Absolute: 0.6 x10E3/uL (ref 0.1–0.9)
Monocytes: 10 %
Neutrophils Absolute: 3.3 x10E3/uL (ref 1.4–7.0)
Neutrophils: 50 %
Platelets: 310 x10E3/uL (ref 150–450)
RBC: 4.86 x10E6/uL (ref 3.77–5.28)
RDW: 14.4 % (ref 11.7–15.4)
WBC: 6.7 x10E3/uL (ref 3.4–10.8)

## 2024-07-20 LAB — LIPID PANEL W/O CHOL/HDL RATIO
Cholesterol, Total: 174 mg/dL (ref 100–199)
HDL: 51 mg/dL (ref >39)
LDL Chol Calc (NIH): 106 mg/dL — ABNORMAL HIGH (ref 0–99)
Triglycerides: 94 mg/dL (ref 0–149)
VLDL Cholesterol Cal: 17 mg/dL (ref 5–40)

## 2024-07-20 LAB — TSH: TSH: 1.52 u[IU]/mL (ref 0.450–4.500)

## 2024-07-20 NOTE — Progress Notes (Signed)
 Please contact patient and let her know labs have returned, she does not consistently check MyChart: Good afternoon Heather Hill, your labs have returned and overall are stable with exception of LDL, bad cholesterol, which has trended up since you stopped taking statin therapy. I do recommend trying the Rosuvastatin  I sent in, even if three days a week, to help prevent stroke or heart event. Vitamin D  remains a little low, ensure you are taking Vitamin D3 2000 units daily. Remainder of labs stable. Any questions? Keep being amazing!!  Thank you for allowing me to participate in your care.  I appreciate you. Kindest regards, Detric Scalisi

## 2024-08-02 ENCOUNTER — Ambulatory Visit: Payer: Self-pay

## 2024-08-02 VITALS — Ht 65.0 in | Wt 188.0 lb

## 2024-08-02 DIAGNOSIS — Z0000 Encounter for general adult medical examination without abnormal findings: Secondary | ICD-10-CM

## 2024-08-02 NOTE — Progress Notes (Signed)
 Chief Complaint  Patient presents with   Medicare Wellness     Subjective:   Heather Hill is a 68 y.o. female who presents for a Medicare Annual Wellness Visit.  Visit info / Clinical Intake: Medicare Wellness Visit Type:: Subsequent Annual Wellness Visit Persons participating in visit and providing information:: patient Medicare Wellness Visit Mode:: Telephone If telephone:: video error (patient could no get connected) Since this visit was completed virtually, some vitals may be partially provided or unavailable. Missing vitals are due to the limitations of the virtual format.: Documented vitals are patient reported If Telephone or Video please confirm:: I connected with patient using audio/video enable telemedicine. I verified patient identity with two identifiers, discussed telehealth limitations, and patient agreed to proceed. Patient Location:: home Provider Location:: clinic office Interpreter Needed?: No Pre-visit prep was completed: yes AWV questionnaire completed by patient prior to visit?: no Living arrangements:: (!) lives alone Patient's Overall Health Status Rating: excellent Typical amount of pain: some Does pain affect daily life?: no Are you currently prescribed opioids?: no  Dietary Habits and Nutritional Risks How many meals a day?: 2 Eats fruit and vegetables daily?: (!) no Most meals are obtained by: preparing own meals In the last 2 weeks, have you had any of the following?: none Diabetic:: no  Functional Status Activities of Daily Living (to include ambulation/medication): Independent Ambulation: Independent with device- listed below Home Assistive Devices/Equipment: Eyeglasses Medication Administration: Independent Home Management (perform basic housework or laundry): Independent Manage your own finances?: yes Primary transportation is: driving Concerns about vision?: no *vision screening is required for WTM* Concerns about hearing?:  no  Fall Screening Falls in the past year?: 0 Number of falls in past year: 0 Was there an injury with Fall?: 0 Fall Risk Category Calculator: 0 Patient Fall Risk Level: Low Fall Risk  Fall Risk Patient at Risk for Falls Due to: No Fall Risks Fall risk Follow up: Falls evaluation completed  Home and Transportation Safety: All rugs have non-skid backing?: (!) no All stairs or steps have railings?: yes Grab bars in the bathtub or shower?: (!) no Have non-skid surface in bathtub or shower?: (!) no Good home lighting?: yes Regular seat belt use?: yes Hospital stays in the last year:: no  Cognitive Assessment Difficulty concentrating, remembering, or making decisions? : no Will 6CIT or Mini Cog be Completed: yes What year is it?: 0 points What month is it?: 0 points Give patient an address phrase to remember (5 components): [address] [place] About what time is it?: 0 points Count backwards from 20 to 1: 0 points Say the months of the year in reverse: 0 points Repeat the address phrase from earlier: 0 points 6 CIT Score: 0 points  Advance Directives (For Healthcare) Does Patient Have a Medical Advance Directive?: No Would patient like information on creating a medical advance directive?: Yes (MAU/Ambulatory/Procedural Areas - Information given)  Reviewed/Updated  Reviewed/Updated: Reviewed All (Medical, Surgical, Family, Medications, Allergies, Care Teams, Patient Goals)    Allergies (verified) Penicillins and Peanut-containing drug products   Current Medications (verified) Outpatient Encounter Medications as of 08/02/2024  Medication Sig   meclizine  (ANTIVERT ) 12.5 MG tablet Take 1 tablet (12.5 mg total) by mouth 3 (three) times daily as needed for dizziness.   rosuvastatin  (CRESTOR ) 10 MG tablet Take 1 tablet (10 mg total) by mouth daily.   sertraline  (ZOLOFT ) 100 MG tablet Take 1 tablet (100 mg total) by mouth at bedtime.   traZODone  (DESYREL ) 150 MG  tablet Take  1 tablet (150 mg total) by mouth at bedtime as needed for sleep.   No facility-administered encounter medications on file as of 08/02/2024.    History: Past Medical History:  Diagnosis Date   Anxiety    Depression    Vertigo    none for over 8 yrs   Wears dentures    partial upper   Past Surgical History:  Procedure Laterality Date   AXILLARY LYMPH NODE BIOPSY Left 04/10/2022   Procedure: AXILLARY LYMPH NODE BIOPSY, excisional;  Surgeon: Desiderio Schanz, MD;  Location: [facility];  Service: General;  Laterality: Left;   BREAST BIOPSY Left 03/06/2022   stereo bx, calcs, COIL clip benign   BREAST EXCISIONAL BIOPSY Left 03/2022   excision of lymph node-benign   COLONOSCOPY WITH PROPOFOL  N/A 02/02/2019   Procedure: COLONOSCOPY WITH PROPOFOL ;  Surgeon: Unk Corinn Skiff, MD;  Location: [facility];  Service: Endoscopy;  Laterality: N/A;   POLYPECTOMY  02/02/2019   Procedure: POLYPECTOMY;  Surgeon: Unk Corinn Skiff, MD;  Location: [facility];  Service: Endoscopy;;   TOTAL ABDOMINAL HYSTERECTOMY     TUBAL LIGATION     Family History  Problem Relation Age of Onset   Diabetes Mother    Alzheimer's disease Mother    Hypertension Father    Asthma Father    Diabetes Father    Bipolar disorder Sister    Diabetes Sister    Diabetes Brother    Breast cancer Maternal Aunt    Diabetes Maternal Grandfather    Alzheimer's disease Maternal Grandfather    Heart disease Maternal Grandfather    Asthma Paternal Grandfather    Diabetes Paternal Grandfather    Hypertension Paternal Grandfather    Social History   Occupational History   Occupation: retired   Occupation: Therapist, 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  Tobacco Use   Smoking status: Every Day    Current packs/day: 0.25    Average packs/day: 0.5 packs/day for 51.3 years (25.5 ttl pk-yrs)    Types: Cigarettes    Start date: 1974    Last attempt to quit: 04/2023   Smokeless tobacco: Never  Vaping  Use   Vaping status: Former   Quit date: 07/27/2013   Substances: Nicotine , Flavoring  Substance and Sexual Activity   Alcohol use: Yes    Alcohol/week: 2.0 standard drinks of alcohol    Types: 2 Glasses of wine per week    Comment: 1 glass of wine or beer 1-2 times per week   Drug use: Never   Sexual activity: Not Currently   Tobacco Counseling Ready to quit: Not Answered Counseling given: Not Answered  SDOH Screenings   Food Insecurity: No Food Insecurity (08/02/2024)  Housing: Low Risk  (08/02/2024)  Transportation Needs: No Transportation Needs (08/02/2024)  Utilities: Not At Risk (08/02/2024)  Alcohol Screen: Low Risk  (07/28/2023)  Depression (PHQ2-9): Low Risk  (08/02/2024)  Financial Resource Strain: Low Risk  (07/28/2023)  Physical Activity: Insufficiently Active (08/02/2024)  Social Connections: Socially Isolated (08/02/2024)  Stress: No Stress Concern Present (08/02/2024)  Tobacco Use: High Risk (08/02/2024)  Health Literacy: Adequate Health Literacy (08/02/2024)   See flowsheets for full screening details  Depression Screen PHQ 2 & 9 Depression Scale- Over the past 2 weeks, how often have you been bothered by any of the following problems? Little interest or pleasure in doing things: 0 Feeling down, depressed, or hopeless (PHQ Adolescent also includes...irritable): 1 PHQ-2 Total Score: 1 Trouble  falling or staying asleep, or sleeping too much: 1 Feeling tired or having little energy: 0 Poor appetite or overeating (PHQ Adolescent also includes...weight loss): 1 Feeling bad about yourself - or that you are a failure or have let yourself or your family down: 0 Trouble concentrating on things, such as reading the newspaper or watching television (PHQ Adolescent also includes...like school work): 0 Moving or speaking so slowly that other people could have noticed. Or the opposite - being so fidgety or restless that you have been moving around a lot more than usual: 0 Thoughts  that you would be better off dead, or of hurting yourself in some way: 0 PHQ-9 Total Score: 3 If you checked off any problems, how difficult have these problems made it for you to do your work, take care of things at home, or get along with other people?: Not difficult at all  Depression Treatment Depression Interventions/Treatment : [id number] Score <4 Follow-up Not Indicated     Goals Addressed               This Visit's Progress     Increase physical activity (pt-stated)   Not on track            Objective:    Today's Vitals   08/02/24 1604  Weight: 188 lb (85.3 kg)  Height: 5' 5 (1.651 m)   Body mass index is 31.28 kg/m.  Hearing/Vision screen Hearing Screening - Comments:: Denies hearing loss  Vision Screening - Comments:: UTD w/ Dr. Feliciano [facility] [place] Immunizations and Health Maintenance Health Maintenance  Topic Date Due   COVID-19 Vaccine (6 - 2025-26 season) 08/02/2024 (Originally 04/25/2024)   Zoster Vaccines- Shingrix (1 of 2) 10/17/2024 (Originally 05/08/1975)   Lung Cancer Screening  01/26/2025   Medicare Annual Wellness (AWV)  08/02/2025   Mammogram  08/09/2025   Colonoscopy  02/01/2026   Bone Density Scan  03/05/2027   DTaP/Tdap/Td (2 - Td or Tdap) 01/18/2029   Pneumococcal Vaccine: 50+ Years  Completed   Influenza Vaccine  Completed   Hepatitis C Screening  Completed   Meningococcal B Vaccine  Aged Out        Assessment/Plan:  This is a routine wellness examination for Amil.  Patient Care Team: Cannady, Jolene T, NP as PCP - General (Nurse Practitioner) Vickey Mettle, MD as Consulting Physician (Psychiatry) Ober Feliciano Hugger, MD as Consulting Physician (Ophthalmology) Gaynel Delon CROME, DPM as Consulting Physician (Podiatry)  I have personally reviewed and noted the following in the patient's chart:   Medical and social history Use of alcohol, tobacco or illicit drugs  Current medications and supplements including opioid  prescriptions. Functional ability and status Nutritional status Physical activity Advanced directives List of other physicians Hospitalizations, surgeries, and ER visits in previous 12 months Vitals Screenings to include cognitive, depression, and falls Referrals and appointments  No orders of the defined types were placed in this encounter.  In addition, I have reviewed and discussed with patient certain preventive protocols, quality metrics, and best practice recommendations. A written personalized care plan for preventive services as well as general preventive health recommendations were provided to patient.   Vina Ned, CMA   08/02/2024   Return in 53 weeks (on 08/08/2025) for Medicare Annual Wellness Visit.  After Visit Summary: (Mail) Due to this being a telephonic visit, the after visit summary with patients personalized plan was offered to patient via mail   Nurse Notes:  MMG scheduled for 08/10/24 Next LDCT scheduled for  01/27/25 Wants to get a covid vaccine at the office. Patient to call and schedule nurse visit. Declined Shingles vaccine

## 2024-08-02 NOTE — Patient Instructions (Signed)
 Heather Hill,  Thank you for taking the time for your Medicare Wellness Visit. I appreciate your continued commitment to your health goals. Please review the care plan we discussed, and feel free to reach out if I can assist you further.  Please note that Annual Wellness Visits do not include a physical exam. Some assessments may be limited, especially if the visit was conducted virtually. If needed, we may recommend an in-person follow-up with your provider.  Ongoing Care Seeing your primary care provider every 3 to 6 months helps us  monitor your health and provide consistent, personalized care.   Referrals If a referral was made during today's visit and you haven't received any updates within two weeks, please contact the referred provider directly to check on the status.  Recommended Screenings:  You may call and schedule a Covid vaccine at your doctor's office at your convenience.   Health Maintenance  Topic Date Due   Medicare Annual Wellness Visit  07/27/2024   COVID-19 Vaccine (6 - 2025-26 season) 08/02/2024*   Zoster (Shingles) Vaccine (1 of 2) 10/17/2024*   Screening for Lung Cancer  01/26/2025   Breast Cancer Screening  08/09/2025   Colon Cancer Screening  02/01/2026   Osteoporosis screening with Bone Density Scan  03/05/2027   DTaP/Tdap/Td vaccine (2 - Td or Tdap) 01/18/2029   Pneumococcal Vaccine for age over 3  Completed   Flu Shot  Completed   Hepatitis C Screening  Completed   Meningitis B Vaccine  Aged Out  *Topic was postponed. The date shown is not the original due date.       08/02/2024    4:10 PM  Advanced Directives  Does Patient Have a Medical Advance Directive? No  Would patient like information on creating a medical advance directive? Yes (MAU/Ambulatory/Procedural Areas - Information given)    Vision: Annual vision screenings are recommended for early detection of glaucoma, cataracts, and diabetic retinopathy. These exams can also reveal signs of  chronic conditions such as diabetes and high blood pressure.  Dental: Annual dental screenings help detect early signs of oral cancer, gum disease, and other conditions linked to overall health, including heart disease and diabetes.  Please see the attached documents for additional preventive care recommendations.

## 2024-08-05 ENCOUNTER — Other Ambulatory Visit: Payer: Self-pay | Admitting: Psychiatry

## 2024-08-10 ENCOUNTER — Inpatient Hospital Stay
Admission: RE | Admit: 2024-08-10 | Discharge: 2024-08-10 | Attending: Nurse Practitioner | Admitting: Nurse Practitioner

## 2024-08-10 DIAGNOSIS — Z1231 Encounter for screening mammogram for malignant neoplasm of breast: Secondary | ICD-10-CM | POA: Insufficient documentation

## 2024-08-27 NOTE — Progress Notes (Unsigned)
 Virtual Visit via Video Note  I connected with Heather Hill on 08/31/2024 at  4:30 PM EST by a video enabled telemedicine application and verified that I am speaking with the correct person using two identifiers.  Location: Patient: home Provider: home office Persons participated in the visit- patient, provider    I discussed the limitations of evaluation and management by telemedicine and the availability of in person appointments. The patient expressed understanding and agreed to proceed.   I discussed the assessment and treatment plan with the patient. The patient was provided an opportunity to ask questions and all were answered. The patient agreed with the plan and demonstrated an understanding of the instructions.   The patient was advised to call back or seek an in-person evaluation if the symptoms worsen or if the condition fails to improve as anticipated.  Katheren Sleet, MD     [facility] MD/PA/NP OP Progress Note  08/31/2024 5:00 PM Heather Hill  MRN:  [id number]  Chief Complaint:  Chief Complaint  Patient presents with   Follow-up   HPI:  This is a follow-up appointment for depression and insomnia.  She states that she has been stressed as her daughter will be moving in a few months.  She has 2 autistic boys and her daughter is not working.  However, she reports good relationship with her daughter and she denies concern about this change as much.  She describes her mother-in-law is narcissist.  She is planning to stay with her family.  Although she was seeing somebody, she got tired of him.  She reports initial insomnia.  She may look at cell phone at night.  She denies feeling depressed or anxiety.  She denies SI, HI, hallucinations.  She denies any concern about the medication she is on except the medication for hyperlipidemia.  She expressed understanding to discuss her concern with the provider who is prescribing that medication.   Substance use   Tobacco Alcohol Other  substances/  Current Not since August, on nicotine  patch Denies lately, six beer in two months Used to drink Two glasses of vodka twice a week or less denies  Past  1/2 PPD Two glasses of vodka every night (denied DUI/DWI) denies  Past Treatment           Support: Household: by herself Marital status: widow Number of children: 1 daughter (her son deceased at age 14 in 41, transferred back from [place]), 3 grandchildren (2 autistic grandsons) Employment: CNA part time, [employer], 3 days a week Education: 12th grade, [employer] for two years     Visit Diagnosis:    ICD-10-CM   1. MDD (major depressive disorder), recurrent, in partial remission  F33.41     2. Insomnia, unspecified type  G47.00       Past Psychiatric History: Please see initial evaluation for full details. I have reviewed the history. No updates at this time.     Past Medical History:  Past Medical History:  Diagnosis Date   Anxiety    Depression    Vertigo    none for over 8 yrs   Wears dentures    partial upper    Past Surgical History:  Procedure Laterality Date   AXILLARY LYMPH NODE BIOPSY Left 04/10/2022   Procedure: AXILLARY LYMPH NODE BIOPSY, excisional;  Surgeon: Desiderio Schanz, MD;  Location: [facility] OR[facility];  Service: General;  Laterality: Left;   BREAST BIOPSY Left 03/06/2022   stereo bx, calcs, COIL clip benign  BREAST EXCISIONAL BIOPSY Left 03/2022   excision of lymph node-benign   COLONOSCOPY WITH PROPOFOL  N/A 02/02/2019   Procedure: COLONOSCOPY WITH PROPOFOL ;  Surgeon: Unk Corinn Skiff, MD;  Location: [facility];  Service: Endoscopy;  Laterality: N/A;   POLYPECTOMY  02/02/2019   Procedure: POLYPECTOMY;  Surgeon: Unk Corinn Skiff, MD;  Location: [facility];  Service: Endoscopy;;   TOTAL ABDOMINAL HYSTERECTOMY     TUBAL LIGATION      Family Psychiatric History: Please see initial evaluation for full details. I have reviewed the history. No updates at this time.      Family History:  Family History  Problem Relation Age of Onset   Diabetes Mother    Alzheimer's disease Mother    Hypertension Father    Asthma Father    Diabetes Father    Bipolar disorder Sister    Diabetes Sister    Diabetes Brother    Breast cancer Maternal Aunt    Diabetes Maternal Grandfather    Alzheimer's disease Maternal Grandfather    Heart disease Maternal Grandfather    Asthma Paternal Grandfather    Diabetes Paternal Grandfather    Hypertension Paternal Grandfather     Social History:  Social History   Socioeconomic History   Marital status: Widowed    Spouse name: Not on file   Number of children: 2   Years of education: Not on file   Highest education level: Some college, no degree  Occupational History   Occupation: retired   Occupation: CNA    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  Tobacco Use   Smoking status: Every Day    Current packs/day: 0.25    Average packs/day: 0.5 packs/day for 51.4 years (25.5 ttl pk-yrs)    Types: Cigarettes    Start date: 45    Last attempt to quit: 04/2023   Smokeless tobacco: Never  Vaping Use   Vaping status: Former   Quit date: 07/27/2013   Substances: Nicotine , Flavoring  Substance and Sexual Activity   Alcohol use: Yes    Alcohol/week: 2.0 standard drinks of alcohol    Types: 2 Glasses of wine per week    Comment: 1 glass of wine or beer 1-2 times per week   Drug use: Never   Sexual activity: Not Currently  Other Topics Concern   Not on file  Social History Narrative   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   1 child living and 1 deceased son dodie)   Social Drivers of Health   Tobacco Use: High Risk (08/31/2024)   Patient History    Smoking Tobacco Use: Every Day    Smokeless Tobacco Use: Never    Passive Exposure: Not on file  Financial Resource Strain: Low Risk (07/28/2023)   Overall Financial Resource Strain (CARDIA)    Difficulty of Paying Living Expenses: Not hard at  all  Food Insecurity: No Food Insecurity (08/02/2024)   Epic    Worried About Radiation Protection Practitioner of Food in the Last Year: Never true    Ran Out of Food in the Last Year: Never true  Transportation Needs: No Transportation Needs (08/02/2024)   Epic    Lack of Transportation (Medical): No    Lack of Transportation (Non-Medical): No  Physical Activity: Insufficiently Active (08/02/2024)   Exercise Vital Sign    Days of Exercise per Week: 3 days    Minutes of Exercise per Session: 10 min  Stress: No Stress Concern  Present (08/02/2024)   [employer] of Occupational Health - Occupational Stress Questionnaire    Feeling of Stress: Not at all  Social Connections: Socially Isolated (08/02/2024)   Social Connection and Isolation Panel    Frequency of Communication with Friends and Family: More than three times a week    Frequency of Social Gatherings with Friends and Family: More than three times a week    Attends Religious Services: Never    Database Administrator or Organizations: No    Attends Banker Meetings: Never    Marital Status: Widowed  Depression (PHQ2-9): Low Risk (08/02/2024)   Depression (PHQ2-9)    PHQ-2 Score: 3  Alcohol Screen: Low Risk (07/28/2023)   Alcohol Screen    Last Alcohol Screening Score (AUDIT): 2  Housing: Low Risk (08/02/2024)   Epic    Unable to Pay for Housing in the Last Year: No    Number of Times Moved in the Last Year: 0    Homeless in the Last Year: No  Utilities: Not At Risk (08/02/2024)   Epic    Threatened with loss of utilities: No  Health Literacy: Adequate Health Literacy (08/02/2024)   B1300 Health Literacy    Frequency of need for help with medical instructions: Never    Allergies: Allergies[1]  Metabolic Disorder Labs: Lab Results  Component Value Date   HGBA1C 5.8 (H) 07/19/2024   No results found for: PROLACTIN Lab Results  Component Value Date   CHOL 174 07/19/2024   TRIG 94 07/19/2024   HDL 51 07/19/2024   VLDL 16  02/28/2019   LDLCALC 106 (H) 07/19/2024   LDLCALC 85 01/11/2024   Lab Results  Component Value Date   TSH 1.520 07/19/2024   TSH 1.230 05/29/2023    Therapeutic Level Labs: No results found for: LITHIUM No results found for: VALPROATE No results found for: CBMZ  Current Medications: Current Outpatient Medications  Medication Sig Dispense Refill   meclizine  (ANTIVERT ) 12.5 MG tablet Take 1 tablet (12.5 mg total) by mouth 3 (three) times daily as needed for dizziness. 90 tablet 5   rosuvastatin  (CRESTOR ) 10 MG tablet Take 1 tablet (10 mg total) by mouth daily. 90 tablet 3   sertraline  (ZOLOFT ) 100 MG tablet Take 1 tablet (100 mg total) by mouth at bedtime. 30 tablet 5   traZODone  (DESYREL ) 150 MG tablet Take 1 tablet (150 mg total) by mouth at bedtime as needed for sleep. 30 tablet 5   No current facility-administered medications for this visit.     Musculoskeletal: Strength & Muscle Tone: N/A Gait & Station: N/A Patient leans: N/A  Psychiatric Specialty Exam: Review of Systems  Psychiatric/Behavioral:  Positive for sleep disturbance. Negative for agitation, behavioral problems, confusion, decreased concentration, dysphoric mood, hallucinations, self-injury and suicidal ideas. The patient is not nervous/anxious and is not hyperactive.   All other systems reviewed and are negative.   There were no vitals taken for this visit.There is no height or weight on file to calculate BMI.  General Appearance: Well Groomed  Eye Contact:  Good  Speech:  Clear and Coherent  Volume:  Normal  Mood:  good  Affect:  Appropriate, Congruent, and calm  Thought Process:  Coherent  Orientation:  Full (Time, Place, and Person)  Thought Content: Logical   Suicidal Thoughts:  No  Homicidal Thoughts:  No  Memory:  Immediate;   Good  Judgement:  Good  Insight:  Good  Psychomotor Activity:  Normal  Concentration:  Concentration:  Good and Attention Span: Good  Recall:  Good  Fund of  Knowledge: Good  Language: Good  Akathisia:  No  Handed:  Right  AIMS (if indicated): not done  Assets:  Communication Skills Desire for Improvement  ADL's:  Intact  Cognition: WNL  Sleep:  Poor   Screenings: AUDIT    Flowsheet Row Clinical Support from 06/11/2022 in [facility]  Alcohol Use Disorder Identification Test Final Score (AUDIT) 8   GAD-7    Flowsheet Row Office Visit from 07/19/2024 in [facility] Office Visit from 01/11/2024 in [facility] Office Visit from 12/04/2023 in [facility] Office Visit from 07/14/2023 in [facility] Office Visit from 05/29/2023 in [facility]  Total GAD-7 Score 0 0 3 2 6    PHQ2-9    Flowsheet Row Clinical Support from 08/02/2024 in [facility] Office Visit from 07/19/2024 in [facility] Office Visit from 01/11/2024 in [facility] Office Visit from 12/04/2023 in [facility] Clinical Support from 07/28/2023 in [facility]  PHQ-2 Total Score 1 0 0 0 0  PHQ-9 Total Score 3 1 1 1  0   Flowsheet Row Pre-Admission Testing 45 from 04/02/2022 in [facility] PRE ADMISSION TESTING  C-SSRS RISK CATEGORY No Risk     Assessment and Plan:  Heather Hill is a 69 y.o. female with a history of depression,s/p hysterectomy, who presents for follow up for below.  1. MDD (major depressive disorder), recurrent, in partial remission Other stressors include: loss of her husband from MI in 2021, loss of her son (transferred from [place]) in 2000 at age 69, financial strain,     She lost her husband from MI in 2021, and her son, who was transferred from [place] in 2000 at age 29.  She struggles with financial strain, and accompanied with her sisters or her mother as well.  History:  transferred from [facility], was on fluoxetine  40 mg BID, Abilify  5 mg daily, lorazepam 1 mg twice a day- ran out a week prior to the visit Although she reports stress of her daughter moving in in April, she maintains good relationship with her.  She denies much concern about her mood symptoms.  Will continue sertraline  to target depression.   2. Insomnia, unspecified type - +snoring. Unable to proceed with HST due to financial strain - drinks coffee two cups per day every other day      She reports slight worsening in initial insomnia.  Explored ways she can improve her sleep hygiene.  Will continue trazodone  as needed for insomnia.    # history of alcohol use She reports a history of alcohol use when her husband was alive, who also struggled with alcohol-related issues. We will continue to assess this.    Plan Continue sertraline  100 mg at night  Continue trazodone  150 mg at night as needed for insomnia- refills left per patient Next appointment: 3/18 at 4 30, video   Past trials of medication: fluoxetine , Abilify , lorazepam     The patient demonstrates the following risk factors for suicide: Chronic risk factors for suicide include: psychiatric disorder of depression . Acute risk factors for suicide include: loss (financial, interpersonal, professional). Protective factors for this patient include: positive social support, responsibility to others (children, family), coping skills, and hope for the future. Considering these  factors, the overall suicide risk at this point appears to be low. Patient is appropriate for outpatient follow up.     Collaboration of Care: Collaboration of Care: Other reviewed notes in Epic  Patient/Guardian was advised Release of Information must be obtained prior to any record release in order to collaborate their care with an outside provider. Patient/Guardian was advised if they have not already done so to contact the registration department to sign all necessary  forms in order for us  to release information regarding their care.   Consent: Patient/Guardian gives verbal consent for treatment and assignment of benefits for services provided during this visit. Patient/Guardian expressed understanding and agreed to proceed.    Katheren Sleet, MD 08/31/2024, 5:00 PM     [1]  Allergies Allergen Reactions   Penicillins Hives   Peanut-Containing Drug Products Itching and Rash    Peanuts

## 2024-08-31 ENCOUNTER — Encounter: Payer: Self-pay | Admitting: Psychiatry

## 2024-08-31 ENCOUNTER — Telehealth: Admitting: Psychiatry

## 2024-08-31 DIAGNOSIS — F3341 Major depressive disorder, recurrent, in partial remission: Secondary | ICD-10-CM

## 2024-08-31 DIAGNOSIS — G4700 Insomnia, unspecified: Secondary | ICD-10-CM

## 2024-08-31 NOTE — Patient Instructions (Signed)
 Continue sertraline  100 mg at night  Continue trazodone  150 mg at night as needed for insomnia Next appointment: 3/18 at 4 30

## 2024-09-26 ENCOUNTER — Other Ambulatory Visit: Payer: Self-pay | Admitting: Psychiatry

## 2024-11-09 ENCOUNTER — Telehealth: Admitting: Psychiatry

## 2025-01-17 ENCOUNTER — Ambulatory Visit: Admitting: Nurse Practitioner

## 2025-01-18 ENCOUNTER — Ambulatory Visit: Admitting: Nurse Practitioner

## 2025-01-27 ENCOUNTER — Other Ambulatory Visit

## 2025-08-08 ENCOUNTER — Ambulatory Visit
